# Patient Record
Sex: Female | Born: 1950 | Race: White | Hispanic: No | Marital: Married | Smoking: Former smoker
Health system: Southern US, Community
[De-identification: ages and names within clinical notes are randomized; demographics above are authoritative.]

## PROBLEM LIST (undated history)

## (undated) DIAGNOSIS — M1990 Unspecified osteoarthritis, unspecified site: Secondary | ICD-10-CM

## (undated) DIAGNOSIS — F329 Major depressive disorder, single episode, unspecified: Secondary | ICD-10-CM

## (undated) DIAGNOSIS — I10 Essential (primary) hypertension: Secondary | ICD-10-CM

## (undated) DIAGNOSIS — S0291XA Unspecified fracture of skull, initial encounter for closed fracture: Secondary | ICD-10-CM

## (undated) DIAGNOSIS — G709 Myoneural disorder, unspecified: Secondary | ICD-10-CM

## (undated) DIAGNOSIS — F0280 Dementia in other diseases classified elsewhere without behavioral disturbance: Secondary | ICD-10-CM

## (undated) DIAGNOSIS — E785 Hyperlipidemia, unspecified: Secondary | ICD-10-CM

## (undated) DIAGNOSIS — F419 Anxiety disorder, unspecified: Secondary | ICD-10-CM

## (undated) DIAGNOSIS — F32A Depression, unspecified: Secondary | ICD-10-CM

## (undated) DIAGNOSIS — K219 Gastro-esophageal reflux disease without esophagitis: Secondary | ICD-10-CM

## (undated) HISTORY — DX: Myoneural disorder, unspecified: G70.9

## (undated) HISTORY — DX: Unspecified osteoarthritis, unspecified site: M19.90

## (undated) HISTORY — DX: Hyperlipidemia, unspecified: E78.5

## (undated) HISTORY — PX: TUBAL LIGATION: SHX77

## (undated) HISTORY — DX: Gastro-esophageal reflux disease without esophagitis: K21.9

## (undated) HISTORY — DX: Depression, unspecified: F32.A

## (undated) HISTORY — DX: Essential (primary) hypertension: I10

## (undated) HISTORY — PX: ABDOMINAL HYSTERECTOMY: SHX81

## (undated) HISTORY — DX: Major depressive disorder, single episode, unspecified: F32.9

## (undated) HISTORY — DX: Anxiety disorder, unspecified: F41.9

---

## 1997-12-30 ENCOUNTER — Emergency Department (HOSPITAL_COMMUNITY): Admission: EM | Admit: 1997-12-30 | Discharge: 1997-12-30 | Payer: Self-pay

## 2002-01-31 ENCOUNTER — Encounter: Payer: Self-pay | Admitting: Emergency Medicine

## 2002-01-31 ENCOUNTER — Emergency Department (HOSPITAL_COMMUNITY): Admission: EM | Admit: 2002-01-31 | Discharge: 2002-02-01 | Payer: Self-pay | Admitting: Plastic Surgery

## 2005-05-18 ENCOUNTER — Inpatient Hospital Stay (HOSPITAL_COMMUNITY): Admission: RE | Admit: 2005-05-18 | Discharge: 2005-05-23 | Payer: Self-pay | Admitting: *Deleted

## 2005-05-19 ENCOUNTER — Ambulatory Visit: Payer: Self-pay | Admitting: *Deleted

## 2007-03-01 ENCOUNTER — Emergency Department (HOSPITAL_COMMUNITY): Admission: EM | Admit: 2007-03-01 | Discharge: 2007-03-01 | Payer: Self-pay | Admitting: Emergency Medicine

## 2008-07-27 ENCOUNTER — Ambulatory Visit: Payer: Self-pay | Admitting: Diagnostic Radiology

## 2008-07-27 ENCOUNTER — Emergency Department (HOSPITAL_BASED_OUTPATIENT_CLINIC_OR_DEPARTMENT_OTHER): Admission: EM | Admit: 2008-07-27 | Discharge: 2008-07-27 | Payer: Self-pay | Admitting: Emergency Medicine

## 2008-09-22 ENCOUNTER — Inpatient Hospital Stay (HOSPITAL_COMMUNITY): Admission: AD | Admit: 2008-09-22 | Discharge: 2008-09-23 | Payer: Self-pay | Admitting: Psychiatry

## 2008-09-22 ENCOUNTER — Ambulatory Visit: Payer: Self-pay | Admitting: Psychiatry

## 2010-04-09 LAB — CBC
HCT: 40.2 % (ref 36.0–46.0)
Hemoglobin: 13.8 g/dL (ref 12.0–15.0)
MCHC: 34.3 g/dL (ref 30.0–36.0)
MCV: 95.1 fL (ref 78.0–100.0)
Platelets: 271 10*3/uL (ref 150–400)
RBC: 4.22 MIL/uL (ref 3.87–5.11)
RDW: 13.4 % (ref 11.5–15.5)
WBC: 11.7 10*3/uL — ABNORMAL HIGH (ref 4.0–10.5)

## 2010-04-09 LAB — COMPREHENSIVE METABOLIC PANEL
ALT: 20 U/L (ref 0–35)
AST: 23 U/L (ref 0–37)
Albumin: 3.5 g/dL (ref 3.5–5.2)
Alkaline Phosphatase: 100 U/L (ref 39–117)
BUN: 7 mg/dL (ref 6–23)
CO2: 28 mEq/L (ref 19–32)
Calcium: 8.9 mg/dL (ref 8.4–10.5)
Chloride: 106 mEq/L (ref 96–112)
Creatinine, Ser: 0.76 mg/dL (ref 0.4–1.2)
GFR calc Af Amer: >60 mL/min (ref >60)
GFR calc non Af Amer: >60 mL/min (ref >60)
Glucose, Bld: 93 mg/dL (ref 70–99)
Potassium: 3.9 mEq/L (ref 3.5–5.1)
Sodium: 140 mEq/L (ref 135–145)
Total Bilirubin: 0.5 mg/dL (ref 0.3–1.2)
Total Protein: 6.5 g/dL (ref 6.0–8.3)
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
## 2010-09-24 LAB — COMPREHENSIVE METABOLIC PANEL
ALT: 25
AST: 27
Albumin: 3.8
Alkaline Phosphatase: 98
BUN: 8
CO2: 28
Calcium: 9.1
Chloride: 105
Creatinine, Ser: 0.74
GFR calc Af Amer: >60
GFR calc non Af Amer: >60
Glucose, Bld: 87
Potassium: 4.3
Sodium: 138
Total Bilirubin: 0.7
Total Protein: 6.4

## 2010-09-24 LAB — DIFFERENTIAL
Basophils Absolute: 0
Basophils Relative: 1
Eosinophils Absolute: 0.2
Eosinophils Relative: 2
Lymphocytes Relative: 40
Lymphs Abs: 3.2
Monocytes Absolute: 0.6
Monocytes Relative: 8
Neutro Abs: 4
Neutrophils Relative %: 50

## 2010-09-24 LAB — CBC
HCT: 41.2
Hemoglobin: 14.2
MCHC: 34.5
MCV: 91.1
Platelets: 246
RBC: 4.52
RDW: 13.3
WBC: 8.1

## 2013-02-12 ENCOUNTER — Other Ambulatory Visit: Payer: Self-pay | Admitting: Family Medicine

## 2013-02-12 DIAGNOSIS — R112 Nausea with vomiting, unspecified: Secondary | ICD-10-CM

## 2013-02-13 ENCOUNTER — Other Ambulatory Visit: Payer: Self-pay | Admitting: Family Medicine

## 2013-02-13 ENCOUNTER — Ambulatory Visit
Admission: RE | Admit: 2013-02-13 | Discharge: 2013-02-13 | Disposition: A | Discharge status: Home / Self Care | Payer: No Typology Code available for payment source | Source: Ambulatory Visit | Attending: Family Medicine | Admitting: Family Medicine

## 2013-02-13 DIAGNOSIS — R112 Nausea with vomiting, unspecified: Secondary | ICD-10-CM

## 2013-02-13 DIAGNOSIS — R109 Unspecified abdominal pain: Secondary | ICD-10-CM

## 2013-02-13 MED ORDER — IOHEXOL 300 MG/ML  SOLN
40.0000 mL | Freq: Once | INTRAMUSCULAR | Status: AC | PRN
  Administered 2013-02-13: 40 mL via ORAL

## 2013-02-13 MED ORDER — IOHEXOL 300 MG/ML  SOLN
100.0000 mL | Freq: Once | INTRAMUSCULAR | Status: AC | PRN
  Administered 2013-02-13: 100 mL via INTRAVENOUS

## 2014-12-29 ENCOUNTER — Ambulatory Visit (INDEPENDENT_AMBULATORY_CARE_PROVIDER_SITE_OTHER): Payer: No Typology Code available for payment source | Admitting: Family Medicine

## 2014-12-29 VITALS — BP 120/66 | HR 79 | Temp 98.8°F | Resp 18 | Ht 60.5 in | Wt 153.4 lb

## 2014-12-29 DIAGNOSIS — J0100 Acute maxillary sinusitis, unspecified: Secondary | ICD-10-CM

## 2014-12-29 DIAGNOSIS — Z7189 Other specified counseling: Secondary | ICD-10-CM

## 2014-12-29 DIAGNOSIS — H6123 Impacted cerumen, bilateral: Secondary | ICD-10-CM

## 2014-12-29 DIAGNOSIS — Z7185 Encounter for immunization safety counseling: Secondary | ICD-10-CM

## 2014-12-29 LAB — ACUTE HEP PANEL AND HEP B SURFACE AB
HCV Ab: NEGATIVE
Hep A IgM: NONREACTIVE
Hep B C IgM: NONREACTIVE
Hep B S Ab: NEGATIVE
Hepatitis B Surface Ag: NEGATIVE

## 2014-12-29 MED ORDER — AMOXICILLIN-POT CLAVULANATE 875-125 MG PO TABS: 1.0000 | ORAL_TABLET | Freq: Two times a day (BID) | ORAL | Status: DC

## 2014-12-29 NOTE — Patient Instructions (Signed)
Cerumen Impaction The structures of the external ear canal secrete a waxy substance known as cerumen. Excess cerumen can build up in the ear canal, causing a condition known as cerumen impaction. Cerumen impaction can cause ear pain and disrupt the function of the ear. The rate of cerumen production differs for each individual. In certain individuals, the configuration of the ear canal may decrease his or her ability to naturally remove cerumen. CAUSES Cerumen impaction is caused by excessive cerumen production or buildup. RISK FACTORS  Frequent use of swabs to clean ears.  Having narrow ear canals.  Having eczema.  Being dehydrated. SIGNS AND SYMPTOMS  Diminished hearing.  Ear drainage.  Ear pain.  Ear itch. TREATMENT Treatment may involve:  Over-the-counter or prescription ear drops to soften the cerumen.  Removal of cerumen by a health care provider. This may be done with:  Irrigation with warm water. This is the most common method of removal.  Ear curettes and other instruments.  Surgery. This may be done in severe cases. HOME CARE INSTRUCTIONS  Take medicines only as directed by your health care provider.  Do not insert objects into the ear with the intent of cleaning the ear. PREVENTION  Do not insert objects into the ear, even with the intent of cleaning the ear. Removing cerumen as a part of normal hygiene is not necessary, and the use of swabs in the ear canal is not recommended.  Drink enough water to keep your urine clear or pale yellow.  Control your eczema if you have it. SEEK MEDICAL CARE IF:  You develop ear pain.  You develop bleeding from the ear.  The cerumen does not clear after you use ear drops as directed.   This information is not intended to replace advice given to you by your health care provider. Make sure you discuss any questions you have with your health care provider.   Document Released: 01/28/2004 Document Revised: 01/10/2014  Document Reviewed: 08/06/2014 Elsevier Interactive Patient Education 2016 Elsevier Inc.  

## 2014-12-29 NOTE — Progress Notes (Signed)
@UMFCLOGO @  By signing my name below, I, [name], attest that this documentation has been prepared under the direction and in the presence of Robyn Haber, MD.  Electronically Signed: [name], ED Scribe. 12/29/2014. 4:45 PM.  Patient ID: Shelly Farrell MRN: [id number], DOB: 01/08/1950, 64 y.o. Date of Encounter: 12/29/2014, 4:30 PM  Primary Physician: No primary care provider on file.  Chief Complaint:  Chief Complaint  Patient presents with  . Need to get check for HEP C    Was exposed to Hep. C. Her daughter was diagnosed with Hep. C 63 weeks ago.  . Sinusitis    facial pain , congestion x yesterday    HPI: 64 y.o. year old female with history below presents with Hep C check. Pt states she's been exposed to Hep C. Her 63 y.o daughter was hospitalized for 1 week, 2 weeks ago, after presenting with dehydration and emesis, later diagnosed with acute hepatitis C.   Sinusitis Pt also reports sinusitis, developed within the past 24 hours. Symptoms consisted of muffled hearing in right ear, congestion and facial pain. No drug allergies.   Positive Depression screening  .  Depression screen PHQ 2/9 12/29/2014  Decreased Interest 2  Down, Depressed, Hopeless 2  PHQ - 2 Score 4  Altered sleeping 0  Tired, decreased energy 2  Change in appetite 1  Feeling bad or failure about yourself  3  Trouble concentrating 1  Moving slowly or fidgety/restless 1  Suicidal thoughts 1  PHQ-9 Score 13  Difficult doing work/chores Somewhat difficult   Pt states her daughter has been a "problem since she was 13". She reports her daughter having trouble with drug and in prison. Pt states she feels discouraged and depressed on and off. Her grand daughter is now living with her. Pt declines needing medication today. She reports she see's her regular physician at, [facility], who's given her everything that she needs.   Past Medical History  Diagnosis Date  . Hyperlipidemia   . Hypertension    . Anxiety   . Arthritis   . Depression   . GERD (gastroesophageal reflux disease)   . Neuromuscular disorder ([place])      Home Meds: Prior to Admission medications   Medication Sig Start Date End Date Taking? Authorizing Provider  amitriptyline (ELAVIL) 10 MG tablet Take 10 mg by mouth at bedtime.   Yes Historical Provider, MD  hydrochlorothiazide (MICROZIDE) 12.5 MG capsule Take 12.5 mg by mouth daily.   Yes Historical Provider, MD  lisinopril (PRINIVIL,ZESTRIL) 20 MG tablet Take 20 mg by mouth daily.   Yes Historical Provider, MD  omeprazole (PRILOSEC) 20 MG capsule Take 20 mg by mouth daily.   Yes Historical Provider, MD    Allergies: No Known Allergies  Social History   Social History  . Marital Status: Married    Spouse Name: N/A  . Number of Children: N/A  . Years of Education: N/A   Occupational History  . Not on file.   Social History Main Topics  . Smoking status: Former Research scientist (life sciences)  . Smokeless tobacco: Not on file  . Alcohol Use: No  . Drug Use: No  . Sexual Activity: Not on file   Other Topics Concern  . Not on file   Social History Narrative  . No narrative on file     Review of Systems: Constitutional: negative for chills, fever, night sweats, weight changes, or fatigue  HEENT: negative for vision changes,   rhinorrhea, ST, epistaxis, or sinus  pressure Cardiovascular: negative for chest pain or palpitations Respiratory: negative for hemoptysis, wheezing, shortness of breath, or cough Abdominal: negative for abdominal pain, nausea, vomiting, diarrhea, or constipation Dermatological: negative for rash Neurologic: negative for headache, dizziness, or syncope All other systems reviewed and are otherwise negative with the exception to those above and in the HPI.   Physical Exam: Blood pressure 120/66, pulse 79, temperature 98.8 F (37.1 C), temperature source Oral, resp. rate 18, height 5' 0.5" (1.537 m), weight 153 lb 6.4 oz (69.582 kg), SpO2 96 %., Body  mass index is 29.45 kg/(m^2). General: Well developed, well nourished, in no acute distress. Head: Normocephalic, atraumatic, eyes without discharge, sclera non-icteric, nares are without discharge. Bilateral auditory canals with cerumen, TM's are without perforation, pearly grey and translucent with reflective cone of light bilaterally. Oral cavity moist, posterior pharynx without exudate, erythema, peritonsillar abscess, or post nasal drip.  Neck: Supple. No thyromegaly. Full ROM. No lymphadenopathy. Lungs: Clear bilaterally to auscultation without wheezes, rales, or rhonchi. Breathing is unlabored. Heart: RRR with S1 S2. No murmurs, rubs, or gallops appreciated. Abdomen: Soft, non-tender, non-distended with normoactive bowel sounds. No hepatomegaly. No rebound/guarding. No obvious abdominal masses. Msk:  Strength and tone normal for age. Extremities/Skin: Warm and dry. No clubbing or cyanosis. No edema. No rashes or suspicious lesions. Neuro: Alert and oriented X 3. Moves all extremities spontaneously. Gait is normal. CNII-XII grossly in tact. Psych:  Responds to questions appropriately with a normal affect.     ASSESSMENT AND PLAN:  64 y.o. year old female with sinusitis, acute stress with daughter's ongoing dependence, and hearing deficit with cerumen impaction This chart was scribed in my presence and reviewed by me personally.    ICD-9-CM ICD-10-CM   1. Immunization counseling V65.49 Z71.89 Hepatitis C antibody     Acute Hep Panel & Hep B Surface Ab  2. Acute maxillary sinusitis, recurrence not specified 461.0 J01.00 amoxicillin-clavulanate (AUGMENTIN) 875-125 MG tablet  3. Cerumen impaction, bilateral 380.4 H61.23      Signed, Robyn Haber, MD    Signed, Robyn Haber, MD 12/29/2014 4:30 PM

## 2015-01-04 ENCOUNTER — Encounter: Payer: Self-pay | Admitting: *Deleted

## 2015-01-06 ENCOUNTER — Telehealth: Payer: Self-pay

## 2015-01-06 NOTE — Telephone Encounter (Signed)
Pt called about lab results. Let her know they were normal and a letter was sent 12/27

## 2017-03-02 ENCOUNTER — Observation Stay (HOSPITAL_COMMUNITY): Payer: Medicare Other

## 2017-03-02 ENCOUNTER — Emergency Department (HOSPITAL_COMMUNITY): Payer: Medicare Other

## 2017-03-02 ENCOUNTER — Inpatient Hospital Stay (HOSPITAL_COMMUNITY)
Admission: EM | Admit: 2017-03-02 | Discharge: 2017-03-08 | DRG: 083 | Disposition: A | Discharge status: Home Health | Payer: Medicare Other | Attending: Neurosurgery | Admitting: Neurosurgery

## 2017-03-02 ENCOUNTER — Other Ambulatory Visit: Payer: Self-pay

## 2017-03-02 ENCOUNTER — Encounter (HOSPITAL_COMMUNITY): Payer: Self-pay

## 2017-03-02 DIAGNOSIS — S06330A Contusion and laceration of cerebrum, unspecified, without loss of consciousness, initial encounter: Secondary | ICD-10-CM

## 2017-03-02 DIAGNOSIS — S0633AA Contusion and laceration of cerebrum, unspecified, with loss of consciousness status unknown, initial encounter: Secondary | ICD-10-CM

## 2017-03-02 DIAGNOSIS — S06339A Contusion and laceration of cerebrum, unspecified, with loss of consciousness of unspecified duration, initial encounter: Secondary | ICD-10-CM

## 2017-03-02 DIAGNOSIS — I609 Nontraumatic subarachnoid hemorrhage, unspecified: Secondary | ICD-10-CM | POA: Diagnosis not present

## 2017-03-02 DIAGNOSIS — K219 Gastro-esophageal reflux disease without esophagitis: Secondary | ICD-10-CM | POA: Diagnosis present

## 2017-03-02 DIAGNOSIS — S02119A Unspecified fracture of occiput, initial encounter for closed fracture: Secondary | ICD-10-CM | POA: Diagnosis present

## 2017-03-02 DIAGNOSIS — R0602 Shortness of breath: Secondary | ICD-10-CM

## 2017-03-02 DIAGNOSIS — E876 Hypokalemia: Secondary | ICD-10-CM | POA: Diagnosis not present

## 2017-03-02 DIAGNOSIS — E785 Hyperlipidemia, unspecified: Secondary | ICD-10-CM | POA: Diagnosis present

## 2017-03-02 DIAGNOSIS — F419 Anxiety disorder, unspecified: Secondary | ICD-10-CM | POA: Diagnosis present

## 2017-03-02 DIAGNOSIS — Z79899 Other long term (current) drug therapy: Secondary | ICD-10-CM

## 2017-03-02 DIAGNOSIS — S06379A Contusion, laceration, and hemorrhage of cerebellum with loss of consciousness of unspecified duration, initial encounter: Principal | ICD-10-CM | POA: Diagnosis present

## 2017-03-02 DIAGNOSIS — W109XXA Fall (on) (from) unspecified stairs and steps, initial encounter: Secondary | ICD-10-CM | POA: Diagnosis present

## 2017-03-02 DIAGNOSIS — S0291XA Unspecified fracture of skull, initial encounter for closed fracture: Secondary | ICD-10-CM | POA: Diagnosis present

## 2017-03-02 DIAGNOSIS — F329 Major depressive disorder, single episode, unspecified: Secondary | ICD-10-CM | POA: Diagnosis present

## 2017-03-02 DIAGNOSIS — Z87891 Personal history of nicotine dependence: Secondary | ICD-10-CM

## 2017-03-02 DIAGNOSIS — J811 Chronic pulmonary edema: Secondary | ICD-10-CM | POA: Diagnosis not present

## 2017-03-02 DIAGNOSIS — I10 Essential (primary) hypertension: Secondary | ICD-10-CM | POA: Diagnosis present

## 2017-03-02 DIAGNOSIS — R064 Hyperventilation: Secondary | ICD-10-CM

## 2017-03-02 DIAGNOSIS — S0211HA Other fracture of occiput, left side, initial encounter for closed fracture: Secondary | ICD-10-CM

## 2017-03-02 LAB — PROTIME-INR
INR: 0.92
Prothrombin Time: 12.2 seconds (ref 11.4–15.2)

## 2017-03-02 LAB — CBC WITH DIFFERENTIAL/PLATELET
Basophils Absolute: 0 10*3/uL (ref 0.0–0.1)
Basophils Relative: 0 %
Eosinophils Absolute: 0.1 10*3/uL (ref 0.0–0.7)
Eosinophils Relative: 1 %
HCT: 36.6 % (ref 36.0–46.0)
Hemoglobin: 11.9 g/dL — ABNORMAL LOW (ref 12.0–15.0)
Lymphocytes Relative: 10 %
Lymphs Abs: 1.7 10*3/uL (ref 0.7–4.0)
MCH: 30.3 pg (ref 26.0–34.0)
MCHC: 32.5 g/dL (ref 30.0–36.0)
MCV: 93.1 fL (ref 78.0–100.0)
Monocytes Absolute: 0.6 10*3/uL (ref 0.1–1.0)
Monocytes Relative: 4 %
Neutro Abs: 14.4 10*3/uL — ABNORMAL HIGH (ref 1.7–7.7)
Neutrophils Relative %: 85 %
Platelets: 257 10*3/uL (ref 150–400)
RBC: 3.93 MIL/uL (ref 3.87–5.11)
RDW: 13.9 % (ref 11.5–15.5)
WBC: 16.7 10*3/uL — ABNORMAL HIGH (ref 4.0–10.5)

## 2017-03-02 LAB — BASIC METABOLIC PANEL
Anion gap: 13 (ref 5–15)
BUN: 19 mg/dL (ref 6–20)
CO2: 23 mmol/L (ref 22–32)
Calcium: 9.1 mg/dL (ref 8.9–10.3)
Chloride: 102 mmol/L (ref 101–111)
Creatinine, Ser: 1.06 mg/dL — ABNORMAL HIGH (ref 0.44–1.00)
GFR calc Af Amer: >60 mL/min (ref >60)
GFR calc non Af Amer: 54 mL/min — ABNORMAL LOW (ref >60)
Glucose, Bld: 120 mg/dL — ABNORMAL HIGH (ref 65–99)
Potassium: 3.8 mmol/L (ref 3.5–5.1)
Sodium: 138 mmol/L (ref 135–145)

## 2017-03-02 LAB — MRSA PCR SCREENING: MRSA by PCR: NEGATIVE

## 2017-03-02 LAB — TYPE AND SCREEN
ABO/RH(D): A POS
Antibody Screen: NEGATIVE

## 2017-03-02 LAB — ABO/RH: ABO/RH(D): A POS

## 2017-03-02 MED ORDER — LISINOPRIL 20 MG PO TABS
20.0000 mg | ORAL_TABLET | Freq: Every day | ORAL | Status: DC
  Administered 2017-03-03 – 2017-03-08 (×6): 20 mg via ORAL
  Filled 2017-03-02 (×8): qty 1

## 2017-03-02 MED ORDER — HYDROCHLOROTHIAZIDE 12.5 MG PO CAPS: 12.5000 mg | ORAL_CAPSULE | Freq: Every day | ORAL | Status: DC

## 2017-03-02 MED ORDER — AMITRIPTYLINE HCL 25 MG PO TABS
50.0000 mg | ORAL_TABLET | Freq: Every day | ORAL | Status: DC
  Administered 2017-03-03 – 2017-03-07 (×5): 50 mg via ORAL
  Filled 2017-03-02 (×6): qty 2

## 2017-03-02 MED ORDER — HYDROCODONE-ACETAMINOPHEN 5-325 MG PO TABS
1.0000 | ORAL_TABLET | ORAL | Status: DC | PRN
  Administered 2017-03-03 – 2017-03-07 (×6): 1 via ORAL
  Filled 2017-03-02 (×6): qty 1

## 2017-03-02 MED ORDER — ONDANSETRON HCL 4 MG/2ML IJ SOLN
4.0000 mg | Freq: Four times a day (QID) | INTRAMUSCULAR | Status: DC | PRN
  Administered 2017-03-02 – 2017-03-03 (×5): 4 mg via INTRAVENOUS
  Filled 2017-03-02 (×6): qty 2

## 2017-03-02 MED ORDER — SIMVASTATIN 20 MG PO TABS
20.0000 mg | ORAL_TABLET | Freq: Every day | ORAL | Status: DC
  Filled 2017-03-02 (×2): qty 1

## 2017-03-02 MED ORDER — FENTANYL CITRATE (PF) 100 MCG/2ML IJ SOLN
50.0000 ug | Freq: Once | INTRAMUSCULAR | Status: AC
Start: 2017-03-02 — End: 2017-03-02
  Administered 2017-03-02: 50 ug via INTRAVENOUS
  Filled 2017-03-02: qty 2

## 2017-03-02 MED ORDER — PANTOPRAZOLE SODIUM 40 MG PO TBEC: 40.0000 mg | DELAYED_RELEASE_TABLET | Freq: Every day | ORAL | Status: DC

## 2017-03-02 MED ORDER — SIMVASTATIN 20 MG PO TABS
20.0000 mg | ORAL_TABLET | Freq: Every day | ORAL | Status: DC
  Administered 2017-03-03 – 2017-03-07 (×5): 20 mg via ORAL
  Filled 2017-03-02 (×6): qty 1

## 2017-03-02 MED ORDER — HYDROCHLOROTHIAZIDE 12.5 MG PO CAPS
12.5000 mg | ORAL_CAPSULE | Freq: Every day | ORAL | Status: DC
  Filled 2017-03-02: qty 1

## 2017-03-02 MED ORDER — ONDANSETRON HCL 4 MG/2ML IJ SOLN
4.0000 mg | Freq: Once | INTRAMUSCULAR | Status: AC | PRN
  Administered 2017-03-02: 4 mg via INTRAVENOUS
  Filled 2017-03-02: qty 2

## 2017-03-02 MED ORDER — ACETAMINOPHEN 325 MG PO TABS
650.0000 mg | ORAL_TABLET | ORAL | Status: DC | PRN
  Administered 2017-03-04 – 2017-03-08 (×7): 650 mg via ORAL
  Filled 2017-03-02 (×7): qty 2

## 2017-03-02 MED ORDER — MORPHINE SULFATE (PF) 4 MG/ML IV SOLN
2.0000 mg | INTRAVENOUS | Status: DC | PRN
  Administered 2017-03-02 – 2017-03-03 (×9): 2 mg via INTRAVENOUS
  Filled 2017-03-02 (×10): qty 1

## 2017-03-02 MED ORDER — PANTOPRAZOLE SODIUM 40 MG PO TBEC
40.0000 mg | DELAYED_RELEASE_TABLET | Freq: Every day | ORAL | Status: DC
  Administered 2017-03-03 – 2017-03-07 (×5): 40 mg via ORAL
  Filled 2017-03-02 (×5): qty 1

## 2017-03-02 MED ORDER — PROCHLORPERAZINE 25 MG RE SUPP
25.0000 mg | Freq: Once | RECTAL | Status: AC
  Administered 2017-03-02: 25 mg via RECTAL
  Filled 2017-03-02: qty 1

## 2017-03-02 MED ORDER — SODIUM CHLORIDE 0.9 % IV SOLN
INTRAVENOUS | Status: DC
  Administered 2017-03-02: 1000 mL via INTRAVENOUS
  Administered 2017-03-04: 16:00:00 via INTRAVENOUS

## 2017-03-02 MED ORDER — SODIUM CHLORIDE 0.9 % IV SOLN: INTRAVENOUS | Status: DC

## 2017-03-02 MED ORDER — ONDANSETRON HCL 4 MG PO TABS: 4.0000 mg | ORAL_TABLET | Freq: Four times a day (QID) | ORAL | Status: DC | PRN

## 2017-03-02 MED ORDER — ONDANSETRON HCL 4 MG/2ML IJ SOLN
4.0000 mg | Freq: Once | INTRAMUSCULAR | Status: AC
  Administered 2017-03-02: 4 mg via INTRAVENOUS
  Filled 2017-03-02: qty 2

## 2017-03-02 MED ORDER — HYDROCHLOROTHIAZIDE 12.5 MG PO CAPS
12.5000 mg | ORAL_CAPSULE | Freq: Every day | ORAL | Status: DC
  Administered 2017-03-03 – 2017-03-08 (×6): 12.5 mg via ORAL
  Filled 2017-03-02 (×7): qty 1

## 2017-03-02 NOTE — ED Notes (Signed)
Pt denies nausea and again refuses compazine suppositiry.

## 2017-03-02 NOTE — ED Notes (Signed)
Pt sleeping at this time.

## 2017-03-02 NOTE — ED Notes (Signed)
Pt to ct with RN x 2 and monitor.

## 2017-03-02 NOTE — ED Triage Notes (Signed)
Patient awoke in the middle of the night to let her dog outside but instead of opening the door to outside, she opened the basement door and fell down a short flight of steps (Per EMS (5) steep steps). Denies LOC, vomited on arrival to room

## 2017-03-02 NOTE — ED Notes (Addendum)
Pt vomits small amount of emesis

## 2017-03-02 NOTE — Discharge Instructions (Addendum)
.  kc °

## 2017-03-02 NOTE — ED Notes (Signed)
Pt moved to hospital bed with assist x3 and  linen removed from behind. Pt begins projectile vomiting large amount. Dr. Cyndy Freeze informed of events. States that vomiting part of injury.Informed pt unable to take po meds at this time.

## 2017-03-02 NOTE — ED Notes (Signed)
Pt vomits small amount then returns to sleep.

## 2017-03-02 NOTE — Progress Notes (Signed)
PT Cancellation Note  Patient Details Name: Amonda Brillhart MRN: [id number] DOB: 08/26/1950   Cancelled Treatment:    Reason Eval/Treat Not Completed: Medical issues which prohibited therapy Per RN pt with increased pain and not appropriate to work with PT. Will follow up as pt medically appropriate and as schedule allows.   Leighton Ruff, PT, DPT  Acute Rehabilitation Services  Pager: [phone]   Rudean Hitt 03/02/2017, 11:08 AM

## 2017-03-02 NOTE — ED Notes (Signed)
Pt states her nausea improved at thsi time and will wait on the suppository.

## 2017-03-02 NOTE — ED Notes (Signed)
Attempted to call report

## 2017-03-02 NOTE — ED Notes (Signed)
Patient vomited again. A&O x4, pupils PERRL

## 2017-03-02 NOTE — ED Notes (Addendum)
Pt reports small amount of vomitting. Pt remains A& O. C/o headache. Dr. Cyndy Freeze notified with order for CT resulting.

## 2017-03-02 NOTE — H&P (Signed)
Cc: Basilar skull fracture, left occiput HPI: Shelly Farrell is a 67 y.o. female Whom while taking the dog out this morning at her home fell down a steep staircase striking the back of her head.  Her daughter was alerted by hearing her mother ask for help. It is unknown if she lost consciousness. When found by her daughter she was alert and following commands. She complained of her head hurting. Shelly Farrell also was nauseated. She arrived by ambulance to [facility] ED approximately 0230 03/02/17. Shelly Farrell has an abrasion lateral aspect left knee. Head CT was performed revealing a basilar skull fracture of the left occiput, and a tiny cerebellar contusion along the fracture line.  No Known Allergies Past Medical History:  Diagnosis Date  . Anxiety   . Arthritis   . Depression   . GERD (gastroesophageal reflux disease)   . Hyperlipidemia   . Hypertension   . Neuromuscular disorder ([place])    Family History  Problem Relation Age of Onset  . Cancer Father   . Hepatitis C Daughter    Social History   Socioeconomic History  . Marital status: Married    Spouse name: Not on file  . Number of children: Not on file  . Years of education: Not on file  . Highest education level: Not on file  Social Needs  . Financial resource strain: Not on file  . Food insecurity - worry: Not on file  . Food insecurity - inability: Not on file  . Transportation needs - medical: Not on file  . Transportation needs - non-medical: Not on file  Occupational History  . Not on file  Tobacco Use  . Smoking status: Former Smoker  Substance and Sexual Activity  . Alcohol use: No    Alcohol/week: 0.0 oz  . Drug use: No  . Sexual activity: Not on file  Other Topics Concern  . Not on file  Social History Narrative  . Not on file   Prior to Admission medications   Medication Sig Start Date End Date Taking? Authorizing Provider  amitriptyline (ELAVIL) 50 MG tablet Take 50 mg by mouth at bedtime.    [provider]  amoxicillin-clavulanate (AUGMENTIN) 875-125 MG tablet Take 1 tablet by mouth 2 (two) times daily. 12/29/14   Robyn Haber, MD  hydrochlorothiazide (MICROZIDE) 12.5 MG capsule Take 12.5 mg by mouth daily.    [provider]  lisinopril (PRINIVIL,ZESTRIL) 20 MG tablet Take 20 mg by mouth daily.    [provider]  omeprazole (PRILOSEC) 20 MG capsule Take 20 mg by mouth daily.    [provider]  simvastatin (ZOCOR) 20 MG tablet Take 20 mg by mouth daily.    [provider]   Results for orders placed or performed during the hospital encounter of 03/02/17 (from the past 24 hour(s))  Basic metabolic panel     Status: Abnormal   Collection Time: 03/02/17  5:43 AM  Result Value Ref Range   Sodium 138 135 - 145 mmol/L   Potassium 3.8 3.5 - 5.1 mmol/L   Chloride 102 101 - 111 mmol/L   CO2 23 22 - 32 mmol/L   Glucose, Bld 120 (H) 65 - 99 mg/dL   BUN 19 6 - 20 mg/dL   Creatinine, Ser 1.06 (H) 0.44 - 1.00 mg/dL   Calcium 9.1 8.9 - 10.3 mg/dL   GFR calc non Af Amer 54 (L) >60 mL/min   GFR calc Af Amer >60 >60 mL/min   Anion  gap 13 5 - 15  CBC with Differential/Platelet     Status: Abnormal   Collection Time: 03/02/17  5:43 AM  Result Value Ref Range   WBC 16.7 (H) 4.0 - 10.5 K/uL   RBC 3.93 3.87 - 5.11 MIL/uL   Hemoglobin 11.9 (L) 12.0 - 15.0 g/dL   HCT 36.6 36.0 - 46.0 %   MCV 93.1 78.0 - 100.0 fL   MCH 30.3 26.0 - 34.0 pg   MCHC 32.5 30.0 - 36.0 g/dL   RDW 13.9 11.5 - 15.5 %   Platelets 257 150 - 400 K/uL   Neutrophils Relative % 85 %   Neutro Abs 14.4 (H) 1.7 - 7.7 K/uL   Lymphocytes Relative 10 %   Lymphs Abs 1.7 0.7 - 4.0 K/uL   Monocytes Relative 4 %   Monocytes Absolute 0.6 0.1 - 1.0 K/uL   Eosinophils Relative 1 %   Eosinophils Absolute 0.1 0.0 - 0.7 K/uL   Basophils Relative 0 %   Basophils Absolute 0.0 0.0 - 0.1 K/uL  Protime-INR     Status: None   Collection Time: 03/02/17  5:43 AM  Result Value Ref Range    Prothrombin Time 12.2 11.4 - 15.2 seconds   INR 0.92   Type and screen     Status: None (Preliminary result)   Collection Time: 03/02/17  5:43 AM  Result Value Ref Range   ABO/RH(D) A POS    Antibody Screen PENDING    Sample Expiration      03/05/2017 Performed at [facility], [address] [place], [place] [address]    Physical Exam  Constitutional: She is oriented to person, place, and time. She appears well-developed and well-nourished. She appears distressed.  HENT:  Right Ear: External ear normal.  Left Ear: External ear normal.  Nose: Nose normal.  Mouth/Throat: Oropharynx is clear and moist.  Eyes: Conjunctivae and EOM are normal. Pupils are equal, round, and reactive to light.  Neck: Normal range of motion. Neck supple.  Cardiovascular: Normal rate, regular rhythm, normal heart sounds and intact distal pulses.  Pulmonary/Chest: Effort normal and breath sounds normal.  Abdominal: Soft. Bowel sounds are normal.  Musculoskeletal: Normal range of motion.  Neurological: She is alert and oriented to person, place, and time. She has normal strength and normal reflexes. She displays normal reflexes. No cranial nerve deficit or sensory deficit. She exhibits normal muscle tone. Coordination normal. GCS eye subscore is 4. GCS verbal subscore is 5. GCS motor subscore is 6. She displays no Babinski's sign on the right side. She displays no Babinski's sign on the left side.  Speech is clear and fluent No nystagmus, normal finger nose finger testing. Decreased hearing  Skin: Skin is warm and dry.  Psychiatric: She has a normal mood and affect. Her behavior is normal. Judgment and thought content normal.  Assessment/Plan Shelly Farrell fell this am sustaining a skull fracture, and cerebral contusion. While she has been nauseated, she has not had any emesis for aproximately 3 hours. I will admit to [facility] for observation. I will start IV fluids, and start on a diet. Close neuro  checks

## 2017-03-02 NOTE — ED Notes (Signed)
Pt placed on pure wick. Repositioned for comfort.

## 2017-03-02 NOTE — ED Provider Notes (Signed)
[facility] EMERGENCY DEPARTMENT Provider Note   CSN: [id number] Arrival date & time: 03/02/17  4098     History   Chief Complaint Chief Complaint  Patient presents with  . Fall    HPI Shelly Farrell is a 67 y.o. female.  The history is provided by the patient.  Fall  This is a new problem. Episode onset: Just prior to arrival. The problem occurs constantly. The problem has been gradually worsening. Associated symptoms include headaches. Pertinent negatives include no chest pain and no abdominal pain. Nothing aggravates the symptoms. Nothing relieves the symptoms.   Patient presents after fall.  She woke up in middle the night to let her dog out, she opened up the wrong door and fell down the steps in her basement.  She did not fall head over heels.  She did fall backwards and hit her posterior scalp.  No LOC, but she reports headache and vomiting.  She denies use of anticoagulants.  She reports neck pain. Past Medical History:  Diagnosis Date  . Anxiety   . Arthritis   . Depression   . GERD (gastroesophageal reflux disease)   . Hyperlipidemia   . Hypertension   . Neuromuscular disorder ([place])     There are no active problems to display for this patient.   OB History    No data available       Home Medications    Prior to Admission medications   Medication Sig Start Date End Date Taking? Authorizing Provider  amitriptyline (ELAVIL) 50 MG tablet Take 50 mg by mouth at bedtime.    [provider]  amoxicillin-clavulanate (AUGMENTIN) 875-125 MG tablet Take 1 tablet by mouth 2 (two) times daily. 12/29/14   Robyn Haber, MD  hydrochlorothiazide (MICROZIDE) 12.5 MG capsule Take 12.5 mg by mouth daily.    [provider]  lisinopril (PRINIVIL,ZESTRIL) 20 MG tablet Take 20 mg by mouth daily.    [provider]  omeprazole (PRILOSEC) 20 MG capsule Take 20 mg by mouth daily.    [provider]  simvastatin (ZOCOR) 20  MG tablet Take 20 mg by mouth daily.    [provider]    Family History Family History  Problem Relation Age of Onset  . Cancer Father   . Hepatitis C Daughter     Social History Social History   Tobacco Use  . Smoking status: Former Smoker  Substance Use Topics  . Alcohol use: No    Alcohol/week: 0.0 oz  . Drug use: No     Allergies   Patient has no known allergies.   Review of Systems Review of Systems  Constitutional: Negative for fever.  Cardiovascular: Negative for chest pain.  Gastrointestinal: Negative for abdominal pain.  Musculoskeletal: Positive for neck pain. Negative for back pain.  Neurological: Positive for headaches.  All other systems reviewed and are negative.    Physical Exam Updated Vital Signs BP (!) 143/61   Pulse 77   Temp 98.7 F (37.1 C) (Oral)   Ht 1.549 m (5\' 1" )   Wt 65.8 kg (145 lb)   SpO2 96%   BMI 27.40 kg/m   Physical Exam CONSTITUTIONAL: She is ill-appearing HEAD: Tenderness and hematoma noted to the occiput, no step-offs or crepitus, no lacerations EYES: EOMI/PERRL ENMT: Mucous membranes moist, no facial trauma NECK: supple no meningeal signs SPINE/BACK: Diffuse cervical spine tenderness, no bruising/crepitance/stepoffs noted to spine CV: S1/S2 noted, no murmurs/rubs/gallops noted LUNGS: Lungs are clear to auscultation bilaterally,  no apparent distress Chest-tenderness along left lower costal margin.,  No crepitus, no bruising ABDOMEN: soft, nontender, no rebound or guarding, bowel sounds noted throughout abdomen No LUQ/RUQ tenderness, no abdominal wall bruising GU:no cva tenderness NEURO: GCS 14-eyes closed.  She follows commands appropriately.  No focal motor deficits are noted extremities EXTREMITIES: pulses normal/equal, full ROM, abrasion beside left knee, no lacerations noted.  Pelvis is stable.  All other extremities/joints palpated/ranged and nontender SKIN: warm, color normal PSYCH: no abnormalities  of mood noted, alert and oriented to situation   ED Treatments / Results  Labs (all labs ordered are listed, but only abnormal results are displayed) Labs Reviewed  CBC WITH DIFFERENTIAL/PLATELET - Abnormal; Notable for the following components:      Result Value   WBC 16.7 (*)    Hemoglobin 11.9 (*)    Neutro Abs 14.4 (*)    All other components within normal limits  PROTIME-INR  BASIC METABOLIC PANEL  TYPE AND SCREEN    EKG  EKG Interpretation None       Radiology Dg Ribs Unilateral W/chest Left  Result Date: 03/02/2017 CLINICAL DATA:  67 year old female with fall and left-sided rib pain. EXAM: LEFT RIBS AND CHEST - 3+ VIEW COMPARISON:  Chest radiograph dated 05/08/2013 FINDINGS: There is emphysematous changes of the lungs with interstitial coarsening and bronchitic changes. Left mid to lower lung field atelectatic changes noted. There is no focal consolidation, pleural effusion, or pneumothorax. The cardiac silhouette is within normal limits. No acute osseous pathology.  No rib fracture. IMPRESSION: Negative. Electronically Signed   By: Anner Crete M.D.   On: 03/02/2017 05:30   Ct Head Wo Contrast  Result Date: 03/02/2017 CLINICAL DATA:  67 y/o  F; fall down stairs. EXAM: CT HEAD WITHOUT CONTRAST CT CERVICAL SPINE WITHOUT CONTRAST TECHNIQUE: Multidetector CT imaging of the head and cervical spine was performed following the standard protocol without intravenous contrast. Multiplanar CT image reconstructions of the cervical spine were also generated. COMPARISON:  03/29/2007 MRI of the head. 07/27/2008 cervical spine radiographs. FINDINGS: CT HEAD FINDINGS Brain: Small hemorrhagic cortical contusion within the left posterior cerebellar hemisphere. Small volume subarachnoid hemorrhage along the paramedian frontal lobes. No additional area of cortical contusion or intracranial hemorrhage identified. Background of mild chronic microvascular ischemic changes and parenchymal volume  loss of the brain. No mass effect, hydrocephalus, or herniation. Vascular: Mild calcific atherosclerosis of carotid siphons. No hyperdense vessel identified. Skull: Nondisplaced acute fracture of the left occipital bone. Sinuses/Orbits: No acute finding. Other: None. CT CERVICAL SPINE FINDINGS Alignment: C4-5 grade 1 anterolisthesis. Straightening of cervical lordosis. Skull base and vertebrae: No acute fracture. No primary bone lesion or focal pathologic process. Soft tissues and spinal canal: No prevertebral fluid or swelling. No visible canal hematoma. Disc levels: Multilevel disc and facet degenerative changes greatest at the C4-5 level. 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 Upper chest: Negative. Other: Negative. IMPRESSION: 1. Nondisplaced left occipital bone acute fracture. 2. Small hemorrhagic cortical contusion in left posterior cerebellar hemisphere. 3. Small volume subarachnoid hemorrhage along paramedian frontal lobes. 4. No acute fracture or dislocation of cervical spine identified. 5. Progression of cervical spine degenerative changes from 2010. 6. Mild chronic microvascular ischemic changes and parenchymal volume loss of the brain. These results were called by telephone at the time of interpretation on 03/02/2017 at 5:23 am to Dr. Ripley Fraise , who verbally acknowledged these results. Electronically Signed   By: Edgardo Roys.D.  On: 03/02/2017 05:25   Ct Cervical Spine Wo Contrast  Result Date: 03/02/2017 CLINICAL DATA:  67 y/o  F; fall down stairs. EXAM: CT HEAD WITHOUT CONTRAST CT CERVICAL SPINE WITHOUT CONTRAST TECHNIQUE: Multidetector CT imaging of the head and cervical spine was performed following the standard protocol without intravenous contrast. Multiplanar CT image reconstructions of the cervical spine were also generated. COMPARISON:  03/29/2007 MRI of the head. 07/27/2008 cervical spine radiographs. FINDINGS: CT  HEAD FINDINGS Brain: Small hemorrhagic cortical contusion within the left posterior cerebellar hemisphere. Small volume subarachnoid hemorrhage along the paramedian frontal lobes. No additional area of cortical contusion or intracranial hemorrhage identified. Background of mild chronic microvascular ischemic changes and parenchymal volume loss of the brain. No mass effect, hydrocephalus, or herniation. Vascular: Mild calcific atherosclerosis of carotid siphons. No hyperdense vessel identified. Skull: Nondisplaced acute fracture of the left occipital bone. Sinuses/Orbits: No acute finding. Other: None. CT CERVICAL SPINE FINDINGS Alignment: C4-5 grade 1 anterolisthesis. Straightening of cervical lordosis. Skull base and vertebrae: No acute fracture. No primary bone lesion or focal pathologic process. Soft tissues and spinal canal: No prevertebral fluid or swelling. No visible canal hematoma. Disc levels: Multilevel disc and facet degenerative changes greatest at the C4-5 level. 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 Upper chest: Negative. Other: Negative. IMPRESSION: 1. Nondisplaced left occipital bone acute fracture. 2. Small hemorrhagic cortical contusion in left posterior cerebellar hemisphere. 3. Small volume subarachnoid hemorrhage along paramedian frontal lobes. 4. No acute fracture or dislocation of cervical spine identified. 5. Progression of cervical spine degenerative changes from 2010. 6. Mild chronic microvascular ischemic changes and parenchymal volume loss of the brain. These results were called by telephone at the time of interpretation on 03/02/2017 at 5:23 am to Dr. Ripley Fraise , who verbally acknowledged these results. Electronically Signed   By: Kristine Garbe M.D.   On: 03/02/2017 05:25    Procedures Procedures  CRITICAL CARE Performed by: Sharyon Cable Total critical care time: 33 minutes Critical care time was  exclusive of separately billable procedures and treating other patients. Critical care was necessary to treat or prevent imminent or life-threatening deterioration. Critical care was time spent personally by me on the following activities: development of treatment plan with patient and/or surrogate as well as nursing, discussions with consultants, evaluation of patient's response to treatment, examination of patient, obtaining history from patient or surrogate, ordering and performing treatments and interventions, ordering and review of laboratory studies, ordering and review of radiographic studies, pulse oximetry and re-evaluation of patient's condition. Patient with skull fracture and traumatic subarachnoid hemorrhage after fall.  She would need to be admitted.  Discussed with neurosurgery consultation for admission  Medications Ordered in ED Medications  ondansetron (ZOFRAN) injection 4 mg (4 mg Intravenous Given 03/02/17 0356)  fentaNYL (SUBLIMAZE) injection 50 mcg (50 mcg Intravenous Given 03/02/17 0430)  ondansetron (ZOFRAN) injection 4 mg (4 mg Intravenous Given 03/02/17 0430)     Initial Impression / Assessment and Plan / ED Course  I have reviewed the triage vital signs and the nursing notes.  Pertinent imaging results that were available during my care of the patient were reviewed by me and considered in my medical decision making (see chart for details).     4:59 AM Patient here after mechanical fall striking her head.  She had vomiting, has a GCS of 14.  Will obtain CT head.  She also has neck pain as well as left chest wall pain.  Imaging ordered 5:53 AM CT imaging findings discussed with Dr. Christella Noa with neurosurgery.  Also discussed findings with patient and family.  Patient remains GCS of 14 no other acute issues. No focal abdominal tenderness.  No cervical spine fracture.  Chest x-ray is negative. 6:38 AM Seen by dr Arneta Cliche for admission Pt awake/alert at this time  Final  Clinical Impressions(s) / ED Diagnoses   Final diagnoses:  Subarachnoid bleed ([place])  Contusion of cerebrum without loss of consciousness, unspecified laterality, initial encounter [facility])  Other closed fracture of left side of occipital bone, initial encounter [facility])    ED Discharge Orders    None       Ripley Fraise, MD 03/02/17 [phone]

## 2017-03-02 NOTE — Progress Notes (Signed)
Patient ID: Shelly Farrell, female   DOB: May 31, 1950, 67 y.o.   MRN: [id number] BP (!) 143/70 (BP Location: Right Arm)   Pulse 81   Temp 98.7 F (37.1 C) (Oral)   Resp 18   Ht 5\' 1"  (1.549 m)   Wt 66.9 kg (147 lb 7.8 oz)   SpO2 98%   BMI 27.87 kg/m  Alert and oriented x 4 Speech is clear, and fluent Moving all extremities well Remains quite nauseated, emesis today. Repeat ct is stable, just believe the cause is due to the injury, and will improve with time.

## 2017-03-02 NOTE — ED Notes (Signed)
Pt again states she cannot take po meds.

## 2017-03-02 NOTE — ED Notes (Signed)
Dr. Cyndy Freeze contacted with continued vomiting.

## 2017-03-02 NOTE — ED Notes (Signed)
Hematoma to occipital lobe.

## 2017-03-03 ENCOUNTER — Encounter (HOSPITAL_COMMUNITY): Payer: Self-pay

## 2017-03-03 DIAGNOSIS — R269 Unspecified abnormalities of gait and mobility: Secondary | ICD-10-CM | POA: Diagnosis not present

## 2017-03-03 DIAGNOSIS — I10 Essential (primary) hypertension: Secondary | ICD-10-CM | POA: Diagnosis present

## 2017-03-03 DIAGNOSIS — W109XXA Fall (on) (from) unspecified stairs and steps, initial encounter: Secondary | ICD-10-CM | POA: Diagnosis present

## 2017-03-03 DIAGNOSIS — S0291XA Unspecified fracture of skull, initial encounter for closed fracture: Secondary | ICD-10-CM | POA: Diagnosis not present

## 2017-03-03 DIAGNOSIS — F419 Anxiety disorder, unspecified: Secondary | ICD-10-CM | POA: Diagnosis present

## 2017-03-03 DIAGNOSIS — S06379A Contusion, laceration, and hemorrhage of cerebellum with loss of consciousness of unspecified duration, initial encounter: Secondary | ICD-10-CM | POA: Diagnosis present

## 2017-03-03 DIAGNOSIS — E785 Hyperlipidemia, unspecified: Secondary | ICD-10-CM | POA: Diagnosis present

## 2017-03-03 DIAGNOSIS — Z79899 Other long term (current) drug therapy: Secondary | ICD-10-CM | POA: Diagnosis not present

## 2017-03-03 DIAGNOSIS — Z87891 Personal history of nicotine dependence: Secondary | ICD-10-CM | POA: Diagnosis not present

## 2017-03-03 DIAGNOSIS — I609 Nontraumatic subarachnoid hemorrhage, unspecified: Secondary | ICD-10-CM | POA: Diagnosis present

## 2017-03-03 DIAGNOSIS — J811 Chronic pulmonary edema: Secondary | ICD-10-CM | POA: Diagnosis not present

## 2017-03-03 DIAGNOSIS — F329 Major depressive disorder, single episode, unspecified: Secondary | ICD-10-CM | POA: Diagnosis present

## 2017-03-03 DIAGNOSIS — S06339A Contusion and laceration of cerebrum, unspecified, with loss of consciousness of unspecified duration, initial encounter: Secondary | ICD-10-CM | POA: Diagnosis not present

## 2017-03-03 DIAGNOSIS — I69398 Other sequelae of cerebral infarction: Secondary | ICD-10-CM | POA: Diagnosis not present

## 2017-03-03 DIAGNOSIS — K219 Gastro-esophageal reflux disease without esophagitis: Secondary | ICD-10-CM | POA: Diagnosis present

## 2017-03-03 DIAGNOSIS — S02119A Unspecified fracture of occiput, initial encounter for closed fracture: Secondary | ICD-10-CM | POA: Diagnosis present

## 2017-03-03 DIAGNOSIS — E876 Hypokalemia: Secondary | ICD-10-CM | POA: Diagnosis not present

## 2017-03-03 MED ORDER — VITAMIN D 1000 UNITS PO TABS
1000.0000 [IU] | ORAL_TABLET | Freq: Every day | ORAL | Status: DC
  Administered 2017-03-04 – 2017-03-08 (×5): 1000 [IU] via ORAL
  Filled 2017-03-03 (×5): qty 1

## 2017-03-03 MED ORDER — KETOROLAC TROMETHAMINE 30 MG/ML IJ SOLN
30.0000 mg | Freq: Once | INTRAMUSCULAR | Status: AC
  Administered 2017-03-03: 30 mg via INTRAVENOUS
  Filled 2017-03-03: qty 1

## 2017-03-03 MED ORDER — HYDROMORPHONE HCL 1 MG/ML IJ SOLN
0.5000 mg | INTRAMUSCULAR | Status: DC | PRN
  Administered 2017-03-03 – 2017-03-05 (×10): 0.5 mg via INTRAVENOUS
  Filled 2017-03-03 (×11): qty 0.5

## 2017-03-03 MED ORDER — ADULT MULTIVITAMIN W/MINERALS CH
1.0000 | ORAL_TABLET | Freq: Every day | ORAL | Status: DC
  Administered 2017-03-04 – 2017-03-08 (×5): 1 via ORAL
  Filled 2017-03-03 (×5): qty 1

## 2017-03-03 NOTE — Progress Notes (Signed)
1900 Bedside shift report. Pt resting, eyes closed, c/o nausea today and severe headache. Dgt and spouse at bedside. Fall precaution in place, [facility].  1955 Pt assessed, see flow sheet. Neuro intact. Pt's dgt informed RN that pt had not urinated since this am. Stated her mom is holding it. RN assisted pt up to [facility]. Pt's urine dark amber. Pt returned to bed. Updated pt and family with POC, fall precautions in place, WCTM.   2020 Dr. Christella Noa at bedside. Updated pt and spouse with CT results and POC. Pt medicated for pain and nausea per MAR. WCTM.   2200 Pt sleeping, easy to arouse. Neuro assessment performed, no changes noted. Son at bedside and report to RN that pt keeps bending arm and IV beeping. Because of mother's headache requested IV be changed. RN placed new IV and IVF resumed.

## 2017-03-03 NOTE — Progress Notes (Signed)
PT Cancellation Note  Patient Details Name: Shelly Farrell MRN: [id number] DOB: [birth year]   Cancelled Treatment:    Reason Eval/Treat Not Completed: Patient declined, no reason specified;Patient not medically ready.  jpt reported getting nauseous every time she moves.  Pt not wanting to move unless its necessary like toileting.  "I'm not ready."  Will see tomorrow as able. 03/03/2017  Donnella Sham, [place] [phone]  (pager)   [name] [name] 03/03/2017, 4:25 PM

## 2017-03-03 NOTE — Progress Notes (Addendum)
0000 Pt sleeping comfortably, easy to arouse, VSS, pt assisted up to [facility], no new neuro changes noted. WCTM.   [place] RN called to room for pain and nausea meds. RN medicated per MAR. No change in assessment noted. WCTM.   0410 Pt sleeping, easy to arouse, pt very HOH in right ear. No new neuro changes noted. WCTM.   0600 RN called to room, assisted pt up to [facility], pt with severe dry heaving/nausea. Pt also c/o pain, medicated per MAR. Pt returned to bed, cold wash cloth to head and ice pack to back of neck. No neuro changes noted with assessment. Pt resting comfortably. Fall precautions in place. Awaiting to give shift report to day RN.

## 2017-03-04 NOTE — Progress Notes (Signed)
Vitals:   03/04/17 0000 03/04/17 0341 03/04/17 0400 03/04/17 0800  BP: (!) 153/72  (!) 158/71   Pulse: 81  85   Resp: 13  15   Temp:  98.7 F (37.1 C)  98.6 F (37 C)  TempSrc:  Oral  Oral  SpO2: 93%  92%   Weight:      Height:        CBC Recent Labs    03/02/17 0543  WBC 16.7*  HGB 11.9*  HCT 36.6  PLT 257   BMET Recent Labs    03/02/17 0543  NA 138  K 3.8  CL 102  CO2 23  GLUCOSE 120*  BUN 19  CREATININE 1.06*  CALCIUM 9.1    Patient continued to complain of headache. Nursing staff notes though that overall headache is much better than when she was initially admitted. Mild lethargy, which may be due to both head injury and pain medication. However oriented to name, [place], and 2019. Following commands. Moving all 4 extremities well.  Plan: Stable following head injury. Continue supportive care.  Hosie Spangle, MD 03/04/2017, 9:49 AM

## 2017-03-04 NOTE — Evaluation (Signed)
Physical Therapy Evaluation Patient Details Name: Shelly Farrell MRN: [id number] DOB: [birth year] Today's Date: 03/04/2017   History of Present Illness  Pt is a 67 y/o female admitted after sustaining a fall at home in which she struck her head. Pt found to have a skull fracture and cerebral contusion. PMH including but not limited to HTN and neuromuscular disorder.    Clinical Impression  Pt presented supine in bed with HOB elevated, awake and willing to participate in therapy session. Pt's son present throughout session as well. Prior to admission, pt reported that she was independent with all functional mobility and ADLs. Pt lives with her husband and daughter in a two level house with five steps to enter. Pt has a son who is also very supportive. Pt currently limited secondary to headache pain, as well as dizziness and nausea with movement. Pt able to perform bed mobility with min A, transfers with min A and take a few side steps at EOB with min A. Pt would greatly benefit from further intensive therapy services at [facility] to maximize her independence with functional mobility prior to returning home with family. PT will continue to follow acutely to progress mobility as tolerated.     Follow Up Recommendations [facility]    Equipment Recommendations  None recommended by PT    Recommendations for Other Services Rehab consult     Precautions / Restrictions Precautions Precautions: Fall Restrictions Weight Bearing Restrictions: No      Mobility  Bed Mobility Overal bed mobility: Needs Assistance Bed Mobility: Supine to Sit;Sit to Supine     Supine to sit: Min assist Sit to supine: Min guard   General bed mobility comments: increased time and effort, cueing for sequencing/technique, assist to elevate trunk; pt with slow guarded movements  Transfers Overall transfer level: Needs assistance Equipment used: 1 person hand held assist(face-to-face with gait belt and pt holding PT's bilateral  UE) Transfers: Sit to/from Stand Sit to Stand: Min assist         General transfer comment: increased time and effort, cueing for technique and safe hand placement, assist for stability with transitional movement  Ambulation/Gait             General Gait Details: Pt able to take 3-4 lateral steps towards her R side with min A and HHA; pt very limited secondary to fatigue, dizziness and pain despite RN administering pain meds at beginning of session  Stairs            Wheelchair Mobility    Modified Rankin (Stroke Patients Only)       Balance Overall balance assessment: Needs assistance Sitting-balance support: Feet supported Sitting balance-[name] Scale: Fair     Standing balance support: During functional activity;Bilateral upper extremity supported Standing balance-[name] Scale: Poor Standing balance comment: reliant on bilateral UEs and external support                             Pertinent Vitals/Pain Pain Assessment: Faces Faces Pain Scale: Hurts whole lot Pain Location: head (anterior aspect) Pain Descriptors / Indicators: Sore;Guarding;Grimacing Pain Intervention(s): Monitored during session;Repositioned;Patient requesting pain meds-RN notified;RN gave pain meds during session    Home Living Family/patient expects to be discharged to:: Private residence Living Arrangements: Spouse/significant other;Children Available Help at Discharge: Family;Available PRN/intermittently Type of Home: House Home Access: Stairs to enter Entrance Stairs-Rails: Right;Left Entrance Stairs-Number of Steps: 5 Home Layout: Two level;Able to live on main level with bedroom/bathroom  Home Equipment: None      Prior Function Level of Independence: Independent               Hand Dominance        Extremity/Trunk Assessment   Upper Extremity Assessment Upper Extremity Assessment: Generalized weakness    Lower Extremity Assessment Lower Extremity  Assessment: Generalized weakness       Communication   Communication: HOH  Cognition Arousal/Alertness: Awake/alert Behavior During Therapy: Flat affect Overall Cognitive Status: Impaired/Different from baseline Area of Impairment: Memory;Safety/judgement;Problem solving                     Memory: Decreased short-term memory;Decreased recall of precautions   Safety/Judgement: Decreased awareness of deficits;Decreased awareness of safety   Problem Solving: Difficulty sequencing;Requires verbal cues        General Comments General comments (skin integrity, edema, etc.): All VSS throughout    Exercises     Assessment/Plan    PT Assessment Patient needs continued PT services  PT Problem List Decreased strength;Decreased activity tolerance;Decreased mobility;Decreased balance;Decreased coordination;Decreased cognition;Decreased knowledge of use of DME;Decreased knowledge of precautions;Decreased safety awareness;Pain       PT Treatment Interventions DME instruction;Gait training;Stair training;Functional mobility training;Therapeutic activities;Therapeutic exercise;Balance training;Neuromuscular re-education;Cognitive remediation;Patient/family education    PT Goals (Current goals can be found in the Care Plan section)  Acute Rehab PT Goals Patient Stated Goal: decrease pain PT Goal Formulation: With patient/family Time For Goal Achievement: 03/18/17 Potential to Achieve Goals: Good    Frequency Min 3X/week   Barriers to discharge        Co-evaluation               AM-PAC PT "6 Clicks" Daily Activity  Outcome Measure Difficulty turning over in bed (including adjusting bedclothes, sheets and blankets)?: A Lot Difficulty moving from lying on back to sitting on the side of the bed? : Unable Difficulty sitting down on and standing up from a chair with arms (e.g., wheelchair, bedside commode, etc,.)?: Unable Help needed moving to and from a bed to chair  (including a wheelchair)?: A Little Help needed walking in hospital room?: A Lot Help needed climbing 3-5 steps with a railing? : Total 6 Click Score: 10    End of Session Equipment Utilized During Treatment: Gait belt;Oxygen Activity Tolerance: Patient limited by fatigue;Patient limited by pain Patient left: in bed;with call bell/phone within reach;with bed alarm set;with family/visitor present Nurse Communication: Mobility status PT Visit Diagnosis: Other abnormalities of gait and mobility (R26.89)    Time: 8250-0370 PT Time Calculation (min) (ACUTE ONLY): 32 min   Charges:   PT Evaluation $PT Eval Moderate Complexity: 1 Mod PT Treatments $Therapeutic Activity: 8-22 mins   PT G Codes:        Gray, PT, DPT [place] 03/04/2017, 11:51 AM

## 2017-03-04 NOTE — Progress Notes (Signed)
Inpatient Rehabilitation  Per PT request, patient was screened by Gunnar Fusi for appropriateness for an Inpatient Acute Rehab consult.  At this time we are recommending an Inpatient Rehab consult as well as OT eval orders.  Please order if you are agreeable.    Carmelia Roller., CCC/SLP Admission Coordinator  [place]  Cell [phone]

## 2017-03-05 MED ORDER — DEXAMETHASONE SODIUM PHOSPHATE 4 MG/ML IJ SOLN
2.0000 mg | Freq: Four times a day (QID) | INTRAMUSCULAR | Status: AC
  Administered 2017-03-05 – 2017-03-06 (×4): 2 mg via INTRAVENOUS
  Filled 2017-03-05 (×4): qty 1

## 2017-03-05 NOTE — Progress Notes (Signed)
RT Note: The patients nurse called RT to come and see the patient because she was having an increased O2 demand. Upon arriving the patients pulse ox was not picking up well and there was no waveform nor oxygen saturation available. After changing the pulse ox the patient was saturating in the low 80's on her nasal cannula. She was placed on a non rebreather which she tolerated well and her Spo2 increased to 97%. Rt attempted to wean her to a venti mask but she desaturated to the high 80's. She was then placed on a partial rebreather mask with an FiO2 of 60-80% and she tolerated that very well. She is now on that partial rebreather still with an Spo2 of 96%. Her nurse paged the MD to see if they wanted to administer a prn nebulizer treatment and they declined but wanted to continue her on her present oxygen without administering a bronchodilator. Rt will continue to monitor and assist as needed.

## 2017-03-05 NOTE — Progress Notes (Signed)
Spouse in to visit this AM and expressed concern about patient going home on pain medication when discharge. Spouse stated live-in daughter is a heroine addict and steals. Writer spoke with daughter yesterday and she was very transparent about her addiction and stated she does heroine. Writer informed spouse it will be their responsibility to secure narcotics if prescribed upon discharge. Spouse voiced understanding and appreciation.

## 2017-03-05 NOTE — Progress Notes (Signed)
Subjective: Patient reports still c/o bad headache  Objective: Vital signs in last 24 hours: Temp:  [98.5 F (36.9 C)-99.9 F (37.7 C)] 98.9 F (37.2 C) (03/03 0849) Pulse Rate:  [81-95] 81 (03/03 0000) Resp:  [13-18] 13 (03/03 0000) BP: (127-171)/(68-101) 171/81 (03/03 0935) SpO2:  [91 %-94 %] 94 % (03/03 0000)  Intake/Output from previous day: 03/02 0701 - 03/03 0700 In: -  Out: 1550 [Urine:1550] Intake/Output this shift: Total I/O In: -  Out: 300 [Urine:300]  Physical Exam: Awake, alert, conversant.  No drift, no meningismus, photophobic.  Lab Results: No results for input(s): WBC, HGB, HCT, PLT in the last 72 hours. BMET No results for input(s): NA, K, CL, CO2, GLUCOSE, BUN, CREATININE, CALCIUM in the last 72 hours.  Studies/Results: No results found.  Assessment/Plan: Continue pain medication.  Will start low dose decadron to see if this helps with her headaches.    LOS: 2 days    Peggyann Shoals, MD 03/05/2017, 11:37 AM

## 2017-03-06 ENCOUNTER — Inpatient Hospital Stay (HOSPITAL_COMMUNITY): Payer: Medicare Other

## 2017-03-06 LAB — COMPREHENSIVE METABOLIC PANEL
ALT: 81 U/L — ABNORMAL HIGH (ref 14–54)
AST: 65 U/L — ABNORMAL HIGH (ref 15–41)
Albumin: 2.9 g/dL — ABNORMAL LOW (ref 3.5–5.0)
Alkaline Phosphatase: 217 U/L — ABNORMAL HIGH (ref 38–126)
Anion gap: 14 (ref 5–15)
BUN: 15 mg/dL (ref 6–20)
CO2: 26 mmol/L (ref 22–32)
Calcium: 8.9 mg/dL (ref 8.9–10.3)
Chloride: 97 mmol/L — ABNORMAL LOW (ref 101–111)
Creatinine, Ser: 0.8 mg/dL (ref 0.44–1.00)
GFR calc Af Amer: >60 mL/min (ref >60)
GFR calc non Af Amer: >60 mL/min (ref >60)
Glucose, Bld: 106 mg/dL — ABNORMAL HIGH (ref 65–99)
Potassium: 3 mmol/L — ABNORMAL LOW (ref 3.5–5.1)
Sodium: 137 mmol/L (ref 135–145)
Total Bilirubin: 1.9 mg/dL — ABNORMAL HIGH (ref 0.3–1.2)
Total Protein: 6.1 g/dL — ABNORMAL LOW (ref 6.5–8.1)

## 2017-03-06 LAB — BASIC METABOLIC PANEL
Anion gap: 12 (ref 5–15)
BUN: 14 mg/dL (ref 6–20)
CO2: 24 mmol/L (ref 22–32)
Calcium: 8.6 mg/dL — ABNORMAL LOW (ref 8.9–10.3)
Chloride: 100 mmol/L — ABNORMAL LOW (ref 101–111)
Creatinine, Ser: 0.79 mg/dL (ref 0.44–1.00)
GFR calc Af Amer: >60 mL/min (ref >60)
GFR calc non Af Amer: >60 mL/min (ref >60)
Glucose, Bld: 142 mg/dL — ABNORMAL HIGH (ref 65–99)
Potassium: 2.6 mmol/L — CL (ref 3.5–5.1)
Sodium: 136 mmol/L (ref 135–145)

## 2017-03-06 LAB — CBC WITH DIFFERENTIAL/PLATELET
Basophils Absolute: 0 10*3/uL (ref 0.0–0.1)
Basophils Relative: 0 %
Eosinophils Absolute: 0 10*3/uL (ref 0.0–0.7)
Eosinophils Relative: 0 %
HCT: 28.3 % — ABNORMAL LOW (ref 36.0–46.0)
Hemoglobin: 9.5 g/dL — ABNORMAL LOW (ref 12.0–15.0)
Lymphocytes Relative: 4 %
Lymphs Abs: 0.9 10*3/uL (ref 0.7–4.0)
MCH: 30.3 pg (ref 26.0–34.0)
MCHC: 33.6 g/dL (ref 30.0–36.0)
MCV: 90.1 fL (ref 78.0–100.0)
Monocytes Absolute: 0.9 10*3/uL (ref 0.1–1.0)
Monocytes Relative: 4 %
Neutro Abs: 18.5 10*3/uL — ABNORMAL HIGH (ref 1.7–7.7)
Neutrophils Relative %: 92 %
Platelets: 235 10*3/uL (ref 150–400)
RBC: 3.14 MIL/uL — ABNORMAL LOW (ref 3.87–5.11)
RDW: 13.4 % (ref 11.5–15.5)
WBC: 20.3 10*3/uL — ABNORMAL HIGH (ref 4.0–10.5)

## 2017-03-06 LAB — URINALYSIS, ROUTINE W REFLEX MICROSCOPIC
Bacteria, UA: NONE SEEN
Bilirubin Urine: NEGATIVE
Glucose, UA: NEGATIVE mg/dL
Ketones, ur: 5 mg/dL — AB
Nitrite: NEGATIVE
Protein, ur: 30 mg/dL — AB
Specific Gravity, Urine: 1.014 (ref 1.005–1.030)
pH: 5 (ref 5.0–8.0)

## 2017-03-06 MED ORDER — POTASSIUM CHLORIDE IN NACL 40-0.9 MEQ/L-% IV SOLN
INTRAVENOUS | Status: DC
  Administered 2017-03-06: 50 mL/h via INTRAVENOUS
  Filled 2017-03-06 (×2): qty 1000

## 2017-03-06 MED ORDER — IPRATROPIUM-ALBUTEROL 0.5-2.5 (3) MG/3ML IN SOLN: 3.0000 mL | Freq: Four times a day (QID) | RESPIRATORY_TRACT | Status: DC | PRN

## 2017-03-06 MED ORDER — FUROSEMIDE 10 MG/ML IJ SOLN
20.0000 mg | Freq: Once | INTRAMUSCULAR | Status: AC
  Administered 2017-03-06: 20 mg via INTRAVENOUS
  Filled 2017-03-06: qty 2

## 2017-03-06 MED ORDER — FUROSEMIDE 20 MG PO TABS
20.0000 mg | ORAL_TABLET | Freq: Every day | ORAL | Status: DC
  Administered 2017-03-06 – 2017-03-08 (×3): 20 mg via ORAL
  Filled 2017-03-06 (×3): qty 1

## 2017-03-06 MED ORDER — POTASSIUM CHLORIDE 10 MEQ/100ML IV SOLN
10.0000 meq | INTRAVENOUS | Status: AC
  Administered 2017-03-06 (×4): 10 meq via INTRAVENOUS
  Filled 2017-03-06 (×4): qty 100

## 2017-03-06 MED ORDER — SODIUM CHLORIDE 0.9 % IV SOLN
INTRAVENOUS | Status: DC
  Administered 2017-03-07 – 2017-03-08 (×2): via INTRAVENOUS

## 2017-03-06 MED ORDER — POTASSIUM CHLORIDE CRYS ER 20 MEQ PO TBCR
20.0000 meq | EXTENDED_RELEASE_TABLET | Freq: Two times a day (BID) | ORAL | Status: DC
  Administered 2017-03-06 – 2017-03-07 (×2): 20 meq via ORAL
  Filled 2017-03-06 (×2): qty 1

## 2017-03-06 MED ORDER — IPRATROPIUM-ALBUTEROL 0.5-2.5 (3) MG/3ML IN SOLN
RESPIRATORY_TRACT | Status: AC
  Administered 2017-03-06: 3 mL
  Filled 2017-03-06: qty 3

## 2017-03-06 MED ORDER — HALOPERIDOL LACTATE 5 MG/ML IJ SOLN
2.0000 mg | Freq: Four times a day (QID) | INTRAMUSCULAR | Status: DC | PRN
  Administered 2017-03-06 – 2017-03-07 (×2): 2 mg via INTRAVENOUS
  Filled 2017-03-06 (×2): qty 1

## 2017-03-06 NOTE — Progress Notes (Signed)
1900-2200- Patient on non-rebreather mask, Sp02 in mid 90s-100%, A&Ox4, NS running at 12ml/hr, up to the [facility] many times for small solid bowel movements. Patient talkative and pleasant, no nausea, only pain med given was tylenol. Patient would take off mask to drink and put on chap stick and would desat into the low 80s.  0000- patient restless, became confused when O2 was off, took a long time to find her words, still up multiple times to the [facility] for small solid BMs.  0330- patient removed non-rebreather and refused to put it back on again, SpO2 was in the 70s, pt was very agitated. RT was able to coax patient in to a high flow [place] @10L . Called MD, received order for duoneb treatment and labs, brought SpO2 back into the 90s. RN and RT ascultataed bilateral fine crackles and wheezes in patient's lungs.  0430- Patient continuing to complain of difficulty breathing; RN stopped fluids, MD called, received order for portable chest x-ray, Lasix IV, and to place a foley catheter.  0645- lab called critical potassium of 2.6. MD paged again, received order for four runs of potassium and to start NS with 11meq K @ 33ml/hr. Patient  still up multiple times to the [facility] for small solid BMs.

## 2017-03-06 NOTE — Progress Notes (Signed)
Physical Therapy Treatment Patient Details Name: Shelly Farrell MRN: [id number] DOB: 1950-01-19 Today's Date: 03/06/2017    History of Present Illness Pt is a 67 y/o female admitted after sustaining a fall at home in which she struck her head. Pt found to have a skull fracture and cerebral contusion. PMH including but not limited to HTN and neuromuscular disorder.    PT Comments    Continuing work on functional mobility and activity tolerance;  Much improved activity tolerance, able to walk in hallway with min assist (and second person for safety); Decr cognition, decr problem-solving, impulsive -- presenting at a [place] Level V1, Confused-Appropriate; Noting respiratory difficulties earlier this morning, walked with supplemental O2 via high flow [name]   Follow Up Recommendations  [facility]     Equipment Recommendations  None recommended by PT    Recommendations for Other Services Rehab consult     Precautions / Restrictions Precautions Precautions: Fall    Mobility  Bed Mobility                  Transfers Overall transfer level: Needs assistance Equipment used: Rolling walker (2 wheeled) Transfers: Sit to/from Stand Sit to Stand: Min assist         General transfer comment: increased time and effort, cueing for technique and safe hand placement, assist for stability with transitional movement  Ambulation/Gait Ambulation/Gait assistance: Min assist;+2 safety/equipment Ambulation Distance (Feet): (Hallway amb) Assistive device: Rolling walker (2 wheeled) Gait Pattern/deviations: Step-through pattern     General Gait Details: Frequently stops and lets go of RW, usually because she is talking and gesturing with her hands; Cues for safety, pathfinding, and RW use; will impulsively reach for objects (typically lines, and at one point, her socks)   Stairs            Wheelchair Mobility    Modified Rankin (Stroke Patients Only)       Balance     Sitting  balance-[name] Scale: Fair       Standing balance-[name] Scale: Poor(approaching Fair)                              Cognition Arousal/Alertness: Awake/alert Behavior During Therapy: Impulsive Overall Cognitive Status: Impaired/Different from baseline Area of Impairment: Memory;Safety/judgement;Problem solving;Rancho level               [place]ancho Levels of Cognitive Functioning [place] [employer] Scales of Cognitive Functioning: Confused/appropriate     Memory: Decreased short-term memory;Decreased recall of precautions   Safety/Judgement: Decreased awareness of deficits;Decreased awareness of safety   Problem Solving: Difficulty sequencing;Requires verbal cues        Exercises      General Comments General comments (skin integrity, edema, etc.): VSS thoughout on a considerable amount of supplemental O2 via high flow [name]      Pertinent Vitals/Pain Pain Assessment: Faces Faces Pain Scale: Hurts little more Pain Location: head (anterior aspect) Pain Descriptors / Indicators: Sore;Guarding;Grimacing Pain Intervention(s): Monitored during session    Home Living                      Prior Function            PT Goals (current goals can now be found in the care plan section) Acute Rehab PT Goals Patient Stated Goal: agreeable to walk PT Goal Formulation: With patient/family Time For Goal Achievement: 03/18/17 Potential to Achieve Goals: Good Progress towards PT goals: Progressing  toward goals    Frequency    Min 3X/week      PT Plan Current plan remains appropriate    Co-evaluation              AM-PAC PT "6 Clicks" Daily Activity  Outcome Measure  Difficulty turning over in bed (including adjusting bedclothes, sheets and blankets)?: A Little Difficulty moving from lying on back to sitting on the side of the bed? : A Lot Difficulty sitting down on and standing up from a chair with arms (e.g., wheelchair, bedside commode, etc,.)?:  A Little Help needed moving to and from a bed to chair (including a wheelchair)?: A Little Help needed walking in hospital room?: A Little Help needed climbing 3-5 steps with a railing? : A Lot 6 Click Score: 16    End of Session Equipment Utilized During Treatment: Gait belt;Oxygen Activity Tolerance: Patient tolerated treatment well Patient left: in chair;with call bell/phone within reach;with chair alarm set Nurse Communication: Mobility status PT Visit Diagnosis: Other abnormalities of gait and mobility (R26.89)     Time: 4665-9935 PT Time Calculation (min) (ACUTE ONLY): 28 min  Charges:  $Gait Training: 8-22 mins $Therapeutic Activity: 8-22 mins                    G Codes:       Shelly Farrell, PT  Acute Rehabilitation Services Pager [phone] Office [phone]    Shelly Farrell 03/06/2017, 2:20 PM

## 2017-03-06 NOTE — Progress Notes (Addendum)
MD office paged [phone], message to be sent to MD asking for medications/restraints for agitation--as pt confusion continues, agitation increasing, pt continually gets out of bed without calling, taking off all monitors and getting verbally aggressive with staff.  Waiting for call back from MD. MD at bedside 1715, added haldol for agitation.

## 2017-03-06 NOTE — Progress Notes (Signed)
Patient ID: Shelly Farrell, female   DOB: 02-12-1950, 67 y.o.   MRN: [id number] Called because of patient agitation She is alert and oriented x1 only Picking at clothing but responds appropriately to questions Will add haldol for agitation

## 2017-03-06 NOTE — Progress Notes (Signed)
Patient ID: Shelly Farrell, female   DOB: [birth year], 67 y.o.   MRN: [id number] BP (!) 144/99 (BP Location: Left Arm)   Pulse (!) 103   Temp 98.4 F (36.9 C) (Oral)   Resp 18   Ht 5\' 1"  (1.549 m)   Wt 66.9 kg (147 lb 7.8 oz)   SpO2 94%   BMI 27.87 kg/m  Alert, following commands, agitated Speech is clear.  Have placed order for oral K+ Will monitor carefully Moving all extremities

## 2017-03-07 LAB — BASIC METABOLIC PANEL
Anion gap: 11 (ref 5–15)
BUN: 17 mg/dL (ref 6–20)
CO2: 27 mmol/L (ref 22–32)
Calcium: 8.4 mg/dL — ABNORMAL LOW (ref 8.9–10.3)
Chloride: 99 mmol/L — ABNORMAL LOW (ref 101–111)
Creatinine, Ser: 0.97 mg/dL (ref 0.44–1.00)
GFR calc Af Amer: >60 mL/min (ref >60)
GFR calc non Af Amer: 60 mL/min — ABNORMAL LOW (ref >60)
Glucose, Bld: 129 mg/dL — ABNORMAL HIGH (ref 65–99)
Potassium: 2.6 mmol/L — CL (ref 3.5–5.1)
Sodium: 137 mmol/L (ref 135–145)

## 2017-03-07 LAB — URINE CULTURE: Culture: NO GROWTH

## 2017-03-07 LAB — GLUCOSE, CAPILLARY: Glucose-Capillary: 93 mg/dL (ref 65–99)

## 2017-03-07 MED ORDER — POTASSIUM CHLORIDE CRYS ER 20 MEQ PO TBCR
40.0000 meq | EXTENDED_RELEASE_TABLET | ORAL | Status: AC
  Administered 2017-03-07 – 2017-03-08 (×3): 40 meq via ORAL
  Filled 2017-03-07 (×3): qty 2

## 2017-03-07 NOTE — Progress Notes (Signed)
CRITICAL VALUE ALERT  Critical Value:  Potassium 2.6  Date & Time Notied:  03/07/17 20:30  Provider Notified: Dr Trenton Gammon  Orders Received/Actions taken: Potassium 51meq X 3 doses

## 2017-03-07 NOTE — Care Management Important Message (Signed)
Important Message  Patient Details  Name: Shelly Farrell MRN: [id number] Date of Birth: 05-10-1950   Medicare Important Message Given:  Yes    [name] [address] 03/07/2017, 2:47 PM

## 2017-03-07 NOTE — Progress Notes (Signed)
Patient ID: Shelly Farrell, female   DOB: 1950-12-28, 68 y.o.   MRN: [id number] BP 131/67   Pulse 95   Temp 98.5 F (36.9 C) (Oral)   Resp 20   Ht 5\' 1"  (1.549 m)   Wt 66.9 kg (147 lb 7.8 oz)   SpO2 97%   BMI 27.87 kg/m  Alert and oriented x 3 Moving all extremities Improved since yesterday. Will check BMET

## 2017-03-07 NOTE — Evaluation (Signed)
Occupational Therapy Evaluation Patient Details Name: Shelly Farrell MRN: [id number] DOB: 1950-10-17 Today's Date: 03/07/2017    History of Present Illness Pt is a 67 y/o female admitted after sustaining a fall at home in which she struck her head. Pt found to have a skull fracture and cerebral contusion. PMH including but not limited to HTN and neuromuscular disorder.   Clinical Impression   Pt admitted with above. She demonstrates the below listed deficits and will benefit from continued OT to maximize safety and independence with BADLs.  Pt presents to OT with generalized weakness, mild cognitive deficits, decreased activity tolerance, decreased balance, and generalized weakness.  She currently requires min guard assist for ADLs.  She lives with spouse and daughter, who are very supportive.  She was previously independent with ADLs and IADLs including driving.  Recommend HHOT.  Will follow acutely.       Follow Up Recommendations  Home health OT;Supervision/Assistance - 24 hour    Equipment Recommendations  3 in 1 bedside commode    Recommendations for Other Services       Precautions / Restrictions Precautions Precautions: Fall      Mobility Bed Mobility Overal bed mobility: Needs Assistance Bed Mobility: Supine to Sit     Supine to sit: Supervision     General bed mobility comments: supervision for safety - somewhat impulsive   Transfers Overall transfer level: Needs assistance Equipment used: None Transfers: Sit to/from Stand;Stand Pivot Transfers Sit to Stand: Min guard Stand pivot transfers: Min guard       General transfer comment: min guard for safety     Balance Overall balance assessment: Needs assistance Sitting-balance support: Feet supported Sitting balance-[name] Scale: Good Sitting balance - Comments: able to don socks EOB with no LOB    Standing balance support: During functional activity;Bilateral upper extremity supported Standing  balance-[name] Scale: Fair                             ADL either performed or assessed with clinical judgement   ADL Overall ADL's : Needs assistance/impaired Eating/Feeding: Independent   Grooming: Wash/dry hands;Wash/dry face;Oral care;Brushing hair;Min guard;Standing   Upper Body Bathing: Set up;Supervision/ safety;Sitting   Lower Body Bathing: Min guard;Sit to/from stand   Upper Body Dressing : Supervision/safety;Set up;Sitting   Lower Body Dressing: Min guard;Sit to/from stand   Toilet Transfer: Min guard;Ambulation;Comfort height toilet   Toileting- Clothing Manipulation and Hygiene: Min guard;Sit to/from stand       Functional mobility during ADLs: Min guard General ADL Comments: min guard for safety      Vision Baseline Vision/History: Wears glasses Wears Glasses: Reading only Patient Visual Report: No change from baseline Vision Assessment?: Yes Eye Alignment: Within Functional Limits Ocular Range of Motion: Within Functional Limits Alignment/Gaze Preference: Within Defined Limits Tracking/Visual Pursuits: Able to track stimulus in all quads without difficulty Saccades: Within functional limits Visual Fields: No apparent deficits Additional Comments: Pt unablet to read items due to not having glasses      Perception Perception Perception Tested?: Yes   Praxis Praxis Praxis tested?: Within functional limits    Pertinent Vitals/Pain Pain Assessment: No/denies pain     Hand Dominance Right   Extremity/Trunk Assessment Upper Extremity Assessment Upper Extremity Assessment: Overall WFL for tasks assessed   Lower Extremity Assessment Lower Extremity Assessment: Defer to PT evaluation   Cervical / Trunk Assessment Cervical / Trunk Assessment: Normal   Communication Communication Communication: [facility]  Cognition Arousal/Alertness: Awake/alert Behavior During Therapy: Impulsive Overall Cognitive Status: Impaired/Different from  baseline Area of Impairment: Attention;Following commands;Safety/judgement;Problem solving;Awareness               Rancho Levels of Cognitive Functioning Rancho Los Amigos Scales of Cognitive Functioning: Automatic/appropriate   Current Attention Level: Selective   Following Commands: Follows one step commands consistently;Follows multi-step commands inconsistently Safety/Judgement: Decreased awareness of safety Awareness: Emergent;Anticipatory Problem Solving: Slow processing General Comments: Pt demonstrates beginning awareness of how deficits may impact her functionally.  She is fully oriented.    General Comments  02 sats on RA at rest 93%.  With ambulation 84-91%, with DOE 3/4.   Pt left on 2.5L with sats 95% - RN aware.  HR 118    Exercises     Shoulder Instructions      Home Living Family/patient expects to be discharged to:: Private residence Living Arrangements: Spouse/significant other;Children Available Help at Discharge: Family;Available PRN/intermittently Type of Home: House Home Access: Stairs to enter [employer] of Steps: 5 Entrance Stairs-Rails: Right;Left Home Layout: Two level;Able to live on main level with bedroom/bathroom Alternate Level Stairs-Number of Steps: flight   Bathroom Shower/Tub: Tub/shower unit         Home Equipment: None          Prior Functioning/Environment Level of Independence: Independent        Comments: Pt reports she drives, grocery shops, manages meds.  She and spouse manage financial responsibilities together         OT Problem List: Decreased activity tolerance;Decreased strength;Impaired balance (sitting and/or standing);Decreased cognition;Decreased safety awareness;Cardiopulmonary status limiting activity      OT Treatment/Interventions: Self-care/ADL training;DME and/or AE instruction;Therapeutic activities;Cognitive remediation/compensation;Patient/family education;Balance training    OT  Goals(Current goals can be found in the care plan section) Acute Rehab OT Goals Patient Stated Goal: to get better  OT Goal Formulation: With patient Time For Goal Achievement: 03/21/17 Potential to Achieve Goals: Good ADL Goals Pt Will Perform Grooming: with modified independence;standing Pt Will Perform Upper Body Bathing: with modified independence;sitting Pt Will Perform Lower Body Bathing: with modified independence;sit to/from stand Pt Will Perform Upper Body Dressing: with modified independence;sitting Pt Will Perform Lower Body Dressing: with modified independence;sit to/from stand Pt Will Transfer to Toilet: with modified independence;ambulating;regular height toilet;grab bars Pt Will Perform Toileting - Clothing Manipulation and hygiene: with modified independence;sit to/from stand Additional ADL Goal #1: Pt will be able to alternate and divide attention during familiar ADL tasks Additional ADL Goal #2: Pt will perform path finding with no more than 2 cues.  OT Frequency: Min 2X/week   Barriers to D/C:            Co-evaluation              AM-PAC PT "6 Clicks" Daily Activity     Outcome Measure Help from another person eating meals?: None Help from another person taking care of personal grooming?: A Little Help from another person toileting, which includes using toliet, bedpan, or urinal?: A Little Help from another person bathing (including washing, rinsing, drying)?: A Little Help from another person to put on and taking off regular upper body clothing?: A Little Help from another person to put on and taking off regular lower body clothing?: A Little 6 Click Score: 19   End of Session Equipment Utilized During Treatment: Gait belt Nurse Communication: Mobility status  Activity Tolerance: Patient tolerated treatment well Patient left: in chair;with call bell/phone within reach;with chair alarm  set  OT Visit Diagnosis: Unsteadiness on feet (R26.81);Cognitive  communication deficit (R41.841)                Time: 1855-0158 OT Time Calculation (min): 29 min Charges:  OT General Charges $OT Visit: 1 Visit OT Evaluation $OT Eval Moderate Complexity: 1 Mod OT Treatments $Therapeutic Activity: 8-22 mins G-Codes:     [facility], OTR/L [phone]   [name] M 03/07/2017, 5:10 PM

## 2017-03-08 DIAGNOSIS — I69398 Other sequelae of cerebral infarction: Secondary | ICD-10-CM

## 2017-03-08 DIAGNOSIS — R269 Unspecified abnormalities of gait and mobility: Secondary | ICD-10-CM

## 2017-03-08 DIAGNOSIS — S06339A Contusion and laceration of cerebrum, unspecified, with loss of consciousness of unspecified duration, initial encounter: Secondary | ICD-10-CM

## 2017-03-08 DIAGNOSIS — S0291XA Unspecified fracture of skull, initial encounter for closed fracture: Secondary | ICD-10-CM

## 2017-03-08 LAB — BASIC METABOLIC PANEL
Anion gap: 10 (ref 5–15)
BUN: 13 mg/dL (ref 6–20)
CO2: 26 mmol/L (ref 22–32)
Calcium: 8.4 mg/dL — ABNORMAL LOW (ref 8.9–10.3)
Chloride: 104 mmol/L (ref 101–111)
Creatinine, Ser: 0.74 mg/dL (ref 0.44–1.00)
GFR calc Af Amer: >60 mL/min (ref >60)
GFR calc non Af Amer: >60 mL/min (ref >60)
Glucose, Bld: 156 mg/dL — ABNORMAL HIGH (ref 65–99)
Potassium: 3.7 mmol/L (ref 3.5–5.1)
Sodium: 140 mmol/L (ref 135–145)

## 2017-03-08 LAB — CBC
HCT: 29.5 % — ABNORMAL LOW (ref 36.0–46.0)
Hemoglobin: 9.6 g/dL — ABNORMAL LOW (ref 12.0–15.0)
MCH: 29.6 pg (ref 26.0–34.0)
MCHC: 32.5 g/dL (ref 30.0–36.0)
MCV: 91 fL (ref 78.0–100.0)
Platelets: 335 10*3/uL (ref 150–400)
RBC: 3.24 MIL/uL — ABNORMAL LOW (ref 3.87–5.11)
RDW: 13.8 % (ref 11.5–15.5)
WBC: 13.9 10*3/uL — ABNORMAL HIGH (ref 4.0–10.5)

## 2017-03-08 LAB — MAGNESIUM: Magnesium: 1.9 mg/dL (ref 1.7–2.4)

## 2017-03-08 NOTE — Consult Note (Signed)
Physical Medicine and Rehabilitation Consult Reason for Consult: Decreased functional mobility Referring Physician: Dr. Cyndy Freeze   HPI: Shelly Farrell is a 67 y.o. right handed female with history of hypertension. Per chart review patient lives with spouse and daughter. Independent prior to admission. Two-level home with bedroom on main level and 5 steps to entry. Husband works during the day. Daughter can assist. Presented 03/02/2017 after a fall down a staircase while taking her dog outside striking her head. Transient loss of consciousness as well as nausea. CT of the head showed nondisplaced left occipital bone acute fracture. Small hemorrhagic cortical contusion and left posterior cerebellar hemisphere. Small volume subarachnoid hemorrhage along paramedian frontal lobes. CT cervical spine negative. Neurosurgery Dr. Christella Noa with conservative care and monitored. Follow-up cranial CT scan previously seen subarachnoid hemorrhage less apparent. No evidence of new bleeding. Hospital course hypokalemia with supplement added. Currently on mechanical soft diet. Physical therapy evaluation completed with recommendations of physical medicine rehabilitation consult.  Patient states she had some word finding issues prior to her fall. Patient remembers the fall. Patient is able to stand with supervision from a seated position Review of Systems  Constitutional: Negative for chills and fever.  HENT: Negative for hearing loss.   Eyes: Negative for double vision.  Respiratory: Negative for cough and shortness of breath.   Cardiovascular: Negative for chest pain, palpitations and leg swelling.  Gastrointestinal: Positive for nausea.       GERD  Genitourinary: Negative for dysuria, flank pain and hematuria.  Musculoskeletal: Positive for myalgias.  Skin: Negative for rash.  Neurological: Positive for headaches. Negative for seizures.  Psychiatric/Behavioral: Positive for depression.       Anxiety  All  other systems reviewed and are negative.  Past Medical History:  Diagnosis Date  . Anxiety   . Arthritis   . Depression   . GERD (gastroesophageal reflux disease)   . Hyperlipidemia   . Hypertension   . Neuromuscular disorder [facility])    Past Surgical History:  Procedure Laterality Date  . ABDOMINAL HYSTERECTOMY    . TUBAL LIGATION     Family History  Problem Relation Age of Onset  . Cancer Father   . Hepatitis C Daughter    Social History:  reports that she has quit smoking. she has never used smokeless tobacco. She reports that she does not drink alcohol or use drugs. Allergies: No Known Allergies Medications Prior to Admission  Medication Sig Dispense Refill  . amitriptyline (ELAVIL) 50 MG tablet Take 50 mg by mouth at bedtime.    . cholecalciferol (VITAMIN D) 1000 units tablet Take 1,000 Units by mouth daily.    . hydrochlorothiazide (MICROZIDE) 12.5 MG capsule Take 12.5 mg by mouth daily.    Marland Kitchen lisinopril (PRINIVIL,ZESTRIL) 20 MG tablet Take 20 mg by mouth daily.    . Multiple Vitamin (MULTIVITAMIN WITH MINERALS) TABS tablet Take 1 tablet by mouth daily.    Marland Kitchen omega-3 acid ethyl esters (LOVAZA) 1 g capsule Take 1 g by mouth daily.    Marland Kitchen omeprazole (PRILOSEC) 20 MG capsule Take 20 mg by mouth daily.    . Probiotic Product (PROBIOTIC PO) Take 1 tablet by mouth 2 (two) times daily.    . simvastatin (ZOCOR) 20 MG tablet Take 20 mg by mouth daily.      Home: Home Living Family/patient expects to be discharged to:: Private residence Living Arrangements: Spouse/significant other, Children Available Help at Discharge: Family, Available PRN/intermittently Type of Home: House Home Access: Stairs  to enter Entrance Stairs-Number of Steps: 5 Entrance Stairs-Rails: Right, Left Home Layout: Two level, Able to live on main level with bedroom/bathroom Alternate Level Stairs-Number of Steps: flight Bathroom Shower/Tub: Tub/shower unit Home Equipment: None  Functional History: Prior  Function Level of Independence: Independent Comments: Pt reports she drives, grocery shops, manages meds.  She and spouse manage financial responsibilities together  Functional Status:  Mobility: Bed Mobility Overal bed mobility: Needs Assistance Bed Mobility: Supine to Sit Supine to sit: Supervision Sit to supine: Min guard General bed mobility comments: supervision for safety - somewhat impulsive  Transfers Overall transfer level: Needs assistance Equipment used: None Transfers: Sit to/from Stand, Stand Pivot Transfers Sit to Stand: Min guard Stand pivot transfers: Min guard General transfer comment: min guard for safety  Ambulation/Gait Ambulation/Gait assistance: Min assist, +2 safety/equipment Ambulation Distance (Feet): (Hallway amb) Assistive device: Rolling walker (2 wheeled) Gait Pattern/deviations: Step-through pattern General Gait Details: Frequently stops and lets go of RW, usually because she is talking and gesturing with her hands; Cues for safety, pathfinding, and RW use; will impulsively reach for objects (typically lines, and at one point, her socks)    ADL: ADL Overall ADL's : Needs assistance/impaired Eating/Feeding: Independent Grooming: Wash/dry hands, Wash/dry face, Oral care, Brushing hair, Min guard, Standing Upper Body Bathing: Set up, Supervision/ safety, Sitting Lower Body Bathing: Min guard, Sit to/from stand Upper Body Dressing : Supervision/safety, Set up, Sitting Lower Body Dressing: Min guard, Sit to/from stand Toilet Transfer: Min guard, Ambulation, Comfort height toilet Toileting- Clothing Manipulation and Hygiene: Min guard, Sit to/from stand Functional mobility during ADLs: Min guard General ADL Comments: min guard for safety   Cognition: Cognition Overall Cognitive Status: Impaired/Different from baseline Orientation Level: Oriented X4 [employer] Scales of Cognitive Functioning:  Automatic/appropriate Cognition Arousal/Alertness: Awake/alert Behavior During Therapy: Impulsive Overall Cognitive Status: Impaired/Different from baseline Area of Impairment: Attention, Following commands, Safety/judgement, Problem solving, Awareness Current Attention Level: Selective Memory: Decreased short-term memory, Decreased recall of precautions Following Commands: Follows one step commands consistently, Follows multi-step commands inconsistently Safety/Judgement: Decreased awareness of safety Awareness: Emergent, Anticipatory Problem Solving: Slow processing General Comments: Pt demonstrates beginning awareness of how deficits may impact her functionally.  She is fully oriented.   Blood pressure (!) 143/75, pulse 95, temperature 98.6 F (37 C), temperature source Oral, resp. rate 18, height 5\' 1"  (1.549 m), weight 66.9 kg (147 lb 7.8 oz), SpO2 98 %. Physical Exam  Vitals reviewed. Constitutional: She appears well-developed.  HENT:  Head: Normocephalic.  Eyes: EOM are normal.  Neck: Normal range of motion. Neck supple. No thyromegaly present.  Cardiovascular: Normal rate, regular rhythm and normal heart sounds.  Respiratory: Effort normal and breath sounds normal. No respiratory distress.  GI: Soft. Bowel sounds are normal. She exhibits no distension.  Neurological:  . Makes good eye contact with examiner. Provides her name and age. She could not recall full events of the fall. Followed simple commands.  Skin: Skin is warm and dry.  Patient is oriented to person place and time He is seated and alert  Results for orders placed or performed during the hospital encounter of 03/02/17 (from the past 24 hour(s))  Glucose, capillary     Status: None   Collection Time: 03/07/17  8:12 AM  Result Value Ref Range   Glucose-Capillary 93 65 - 99 mg/dL  Basic metabolic panel     Status: Abnormal   Collection Time: 03/07/17  7:10 PM  Result Value Ref Range   Sodium  137 135 - 145  mmol/L   Potassium 2.6 (LL) 3.5 - 5.1 mmol/L   Chloride 99 (L) 101 - 111 mmol/L   CO2 27 22 - 32 mmol/L   Glucose, Bld 129 (H) 65 - 99 mg/dL   BUN 17 6 - 20 mg/dL   Creatinine, Ser 0.97 0.44 - 1.00 mg/dL   Calcium 8.4 (L) 8.9 - 10.3 mg/dL   GFR calc non Af Amer 60 (L) >60 mL/min   GFR calc Af Amer >60 >60 mL/min   Anion gap 11 5 - 15   No results found.  Assessment/Plan: Diagnosis: Occipital hemorrhagic contusion with gait imbalance 1. Does the need for close, 24 hr/day medical supervision in concert with the patient's rehab needs make it unreasonable for this patient to be served in a less intensive setting? No 2. Co-Morbidities requiring supervision/potential complications: Hypo-kalemia, hypertension, occipital fracture 3. Due to medication administration, pain management and patient education, does the patient require 24 hr/day rehab nursing? Potentially 4. Does the patient require coordinated care of a physician, rehab nurse, PT, OT to address physical and functional deficits in the context of the above medical diagnosis(es)? No Addressing deficits in the following areas: balance, bathing and toileting 5. Can the patient actively participate in an intensive therapy program of at least 3 hrs of therapy per day at least 5 days per week? Yes 6. The potential for patient to make measurable gains while on inpatient rehab is Limited based on her high current level of functioning 7. Anticipated functional outcomes upon discharge from inpatient rehab are n/a  with PT, n/a with OT, n/a with SLP. 8. Estimated rehab length of stay to reach the above functional goals is: Not applicable 9. Anticipated D/C setting: Home 10. Anticipated post D/C treatments: [facility] therapy 11. Overall Rehab/Functional Prognosis: excellent  RECOMMENDATIONS: This patient's condition is appropriate for continued rehabilitative care in the following setting: [facility] Therapy Patient has agreed to participate in recommended  program. Yes Note that insurance prior authorization may be required for reimbursement for recommended care.  Comment:   Charlett Blake M.D. [facility] FAAPM&R (Sports Med, Neuromuscular Med) Diplomate Am Board of Electrodiagnostic Med  Cathlyn Parsons, PA-C 03/08/2017

## 2017-03-08 NOTE — Progress Notes (Signed)
Patient foley and Iv removed. Patient belongings are packed up. Patient educated about s/s of when to call doctor and 911.  Patient has no questions. Discharge complete, patient in room having lunch, waiting for bsc and ride.

## 2017-03-08 NOTE — Discharge Summary (Signed)
Physician Discharge Summary  Patient ID: Shelly Farrell MRN: [id number] DOB/AGE: 07-07-1950 67 y.o.  Admit date: 03/02/2017 Discharge date: 03/08/2017  Admission Diagnoses:Skull fracture with cerebral contusion  Discharge Diagnoses:  Active Problems:   Skull fracture with cerebral contusion, closed, initial encounter [facility])   Discharged Condition: good  Hospital Course: Shelly Farrell was admitted after falling sustaining a basilar skull fracture and a small cerebellar contusion. She initially complained of a headache and had severe nausea and multiple episodes of emesis. This eventually resolved but then she became quite agitated. This led in quick succession to increased IV fluids which led to pulmonary edema, and then treatment with diuretics. She had a low potassium treated with both oral and IV potassium supplements. She is alert and oriented currently. Rehab feels she is able to go home with supervision, and home health therapies.   Treatments: IV hydration  Discharge Exam: Blood pressure 133/73, pulse 95, temperature 98.8 F (37.1 C), temperature source Oral, resp. rate (!) 25, height 5\' 1"  (1.549 m), weight 66.9 kg (147 lb 7.8 oz), SpO2 98 %. General appearance: alert, cooperative, appears stated age and no distress Neurologic: Alert and oriented X 3, normal strength and tone. Normal symmetric reflexes. Normal coordination and gait  Disposition:  * No surgery found * Discharge Instructions    DME Bedside commode   Complete by:  As directed    Patient needs a bedside commode to treat with the following condition:  Closed head injury   Face-to-face encounter (required for Medicare/Medicaid patients)   Complete by:  As directed    I Ramzi Brathwaite L certify that this patient is under my care and that I, or a nurse practitioner or physician's assistant working with me, had a face-to-face encounter that meets the physician face-to-face encounter requirements with this patient on 03/08/2017.  The encounter with the patient was in whole, or in part for the following medical condition(s) which is the primary reason for home health care (List medical condition): closed head injury   The encounter with the patient was in whole, or in part, for the following medical condition, which is the primary reason for home health care:  closed head injury   I certify that, based on my findings, the following services are medically necessary home health services:  Physical therapy   Reason for Medically Necessary Home Health Services:  Therapy- Personnel officer, Public librarian   My clinical findings support the need for the above services:  Unable to leave home safely without assistance and/or assistive device   Further, I certify that my clinical findings support that this patient is homebound due to:  Unable to leave home safely without assistance   Home Health   Complete by:  As directed    To provide the following care/treatments:   PT OT       Allergies as of 03/08/2017   No Known Allergies     Medication List    TAKE these medications   amitriptyline 50 MG tablet Commonly known as:  ELAVIL Take 50 mg by mouth at bedtime.   cholecalciferol 1000 units tablet Commonly known as:  VITAMIN D Take 1,000 Units by mouth daily.   hydrochlorothiazide 12.5 MG capsule Commonly known as:  MICROZIDE Take 12.5 mg by mouth daily.   lisinopril 20 MG tablet Commonly known as:  PRINIVIL,ZESTRIL Take 20 mg by mouth daily.   multivitamin with minerals Tabs tablet Take 1 tablet by mouth daily.   omega-3 acid ethyl esters  1 g capsule Commonly known as:  LOVAZA Take 1 g by mouth daily.   omeprazole 20 MG capsule Commonly known as:  PRILOSEC Take 20 mg by mouth daily.   PROBIOTIC PO Take 1 tablet by mouth 2 (two) times daily.   simvastatin 20 MG tablet Commonly known as:  ZOCOR Take 20 mg by mouth daily.            Durable Medical Equipment  (From admission,  onward)        Start     Ordered   03/08/17 0000  DME Bedside commode    Question:  Patient needs a bedside commode to treat with the following condition  Answer:  Closed head injury   03/08/17 1030     Follow-up Information    Ashok Pall, MD Follow up in 3 week(s).   Specialty:  Neurosurgery Why:  please call to make an appointment Contact information: [address] [place] [address] [phone]           Signed: Winfield Cunas 03/08/2017, 10:32 AM

## 2017-03-08 NOTE — Progress Notes (Signed)
Occupational Therapy Treatment Patient Details Name: Shelly Farrell MRN: [id number] DOB: 05-08-1950 Today's Date: 03/08/2017    History of present illness Pt is a 67 y/o female admitted after sustaining a fall at home in which she struck her head. Pt found to have a skull fracture and cerebral contusion. PMH including but not limited to HTN and neuromuscular disorder.   OT comments  Upon arrival, pt awake sitting up in recliner. Providing MOCA assessment for cognition. Pt with an adjusted score of 8/24 (see cognition section). Pt with deficits significant of memory and attention. Educated pt on need for supervision during ADLs and IADLs at home as well as compensatory techniques such as external memory aides. Pt demonstrating increased awareness to verbalize understanding of education and provide examples of where she will need assistance at home. Answered all pt questions in preparation for dc later today. Will continue to follow acutely as admitted and continue to recommend follow up with [facility].    Follow Up Recommendations  Home health OT;Supervision/Assistance - 24 hour    Equipment Recommendations  3 in 1 bedside commode    Recommendations for Other Services      Precautions / Restrictions Precautions Precautions: Fall Restrictions Weight Bearing Restrictions: No       Mobility Bed Mobility                  Transfers                      Balance Overall balance assessment: Needs assistance Sitting-balance support: Feet supported Sitting balance-Shelly Farrell Scale: Good                                    ADL either performed or assessed with clinical judgement   ADL Overall ADL's : Needs assistance/impaired                                       General ADL Comments: Administering MOCA and providing education on cognitive deficits, compensatory techniques for cognition, and need for supervision at home by family.      Vision        Perception     Praxis      Cognition Arousal/Alertness: Awake/alert Behavior During Therapy: Impulsive Overall Cognitive Status: Impaired/Different from baseline Area of Impairment: Attention;Following commands;Safety/judgement;Problem solving;Awareness;Memory               Rancho Levels of Cognitive Functioning Rancho Los Amigos Scales of Cognitive Functioning: Automatic/appropriate   Current Attention Level: Selective Memory: Decreased short-term memory;Decreased recall of precautions Following Commands: Follows one step commands consistently;Follows multi-step commands inconsistently Safety/Judgement: Decreased awareness of safety Awareness: Emergent;Anticipatory Problem Solving: Slow processing General Comments: Administered MOCA (with exception of orientation questions). Pt scoring a 7/24 adjusted score. Pt with deficits in executive funcitoning, memory, attention, and abstraction. Pt with difficulty maintaining attention to follow directions. Educated pt on external memory aides for home safety and to have someone supervising her during ADLs and iADLs.         Exercises     Shoulder Instructions       General Comments      Pertinent Vitals/ Pain       Pain Assessment: No/denies pain  Home Living  Prior Functioning/Environment              Frequency  Min 2X/week        Progress Toward Goals  OT Goals(current goals can now be found in the care plan section)  Progress towards OT goals: Progressing toward goals  Acute Rehab OT Goals Patient Stated Goal: to get better  OT Goal Formulation: With patient Time For Goal Achievement: 03/21/17 Potential to Achieve Goals: Good ADL Goals Pt Will Perform Grooming: with modified independence;standing Pt Will Perform Upper Body Bathing: with modified independence;sitting Pt Will Perform Lower Body Bathing: with modified independence;sit  to/from stand Pt Will Perform Upper Body Dressing: with modified independence;sitting Pt Will Perform Lower Body Dressing: with modified independence;sit to/from stand Pt Will Transfer to Toilet: with modified independence;ambulating;regular height toilet;grab bars Pt Will Perform Toileting - Clothing Manipulation and hygiene: with modified independence;sit to/from stand Additional ADL Goal #1: Pt will be able to alternate and divide attention during familiar ADL tasks Additional ADL Goal #2: Pt will perform path finding with no more than 2 cues.  Plan Discharge plan remains appropriate    Co-evaluation                 AM-PAC PT "6 Clicks" Daily Activity     Outcome Measure   Help from another person eating meals?: None Help from another person taking care of personal grooming?: A Little Help from another person toileting, which includes using toliet, bedpan, or urinal?: A Little Help from another person bathing (including washing, rinsing, drying)?: A Little Help from another person to put on and taking off regular upper body clothing?: A Little Help from another person to put on and taking off regular lower body clothing?: A Little 6 Click Score: 19    End of Session    OT Visit Diagnosis: Unsteadiness on feet (R26.81);Cognitive communication deficit (R41.841)   Activity Tolerance Patient tolerated treatment well   Patient Left in chair;with call bell/phone within reach;with chair alarm set   Nurse Communication Mobility status        Time: 2229-7989 OT Time Calculation (min): 27 min  Charges: OT General Charges $OT Visit: 1 Visit OT Treatments $Self Care/Home Management : 8-22 mins $Therapeutic Activity: 8-22 mins  [name] MSOT, OTR/L Acute Rehab Pager: [phone] Office: [place] 03/08/2017, 1:47 PM

## 2017-03-08 NOTE — Care Management Note (Signed)
Case Management Note  Patient Details  Name: Shelly Farrell MRN: [id number] Date of Birth: January 20, 1950  Subjective/Objective:  Pt is a 67 y/o female admitted after sustaining a fall at home in which she struck her head. Pt found to have a skull fracture and cerebral contusion.  PTA, pt independent, lives with husband and adult daughter.                    Action/Plan: Pt medically stable for discharge home today with 24h supervision from family.  She confirms with family that she has RW at home.  Referral to [facility] for [facility] follow up; start of care 24-48h post dc date.  BSC ordered from [facility] for delivery to room prior to dc. Pt weaned from O2; sats 93-100 on RA at rest and with ambulation.    Expected Discharge Date:  03/08/17               Expected Discharge Plan:  [place]  In-House Referral:     Discharge planning Services  CM Consult  Post Acute Care Choice:  Home Health Choice offered to:  Patient  DME Arranged:  3-N-1 DME Agency:     HH Arranged:  OT, PT [place] Agency:  [facility]  Status of Service:  Completed, signed off  If discussed at [place] of Stay Meetings, dates discussed:    Additional Comments:  Reinaldo Raddle, RN, BSN  Trauma/Neuro ICU Case Manager [phone]

## 2017-03-08 NOTE — Progress Notes (Signed)
Physical Therapy Treatment Patient Details Name: Aaliah Jorgenson MRN: [id number] DOB: 07/18/1950 Today's Date: 03/08/2017    History of Present Illness Pt is a 67 y/o female admitted after sustaining a fall at home in which she struck her head. Pt found to have a skull fracture and cerebral contusion. PMH including but not limited to HTN and neuromuscular disorder.    PT Comments    Pt has improved a great amount with oxygenation and gait stability.  Mentation is not back to baseline, pt can be impulsive at times and not fully aware of safety issues.  Should be safe with available assist.    Follow Up Recommendations  Home health PT     Equipment Recommendations  Rolling walker with 5" wheels;3in1 (PT)    Recommendations for Other Services       Precautions / Restrictions Precautions Precautions: Fall Restrictions Weight Bearing Restrictions: No    Mobility  Bed Mobility               General bed mobility comments: up in the chair on arrival  Transfers Overall transfer level: Needs assistance   Transfers: Sit to/from Stand Sit to Stand: Min guard Stand pivot transfers: Supervision;Min guard       General transfer comment: pt mildly unsteady upon initial standing  Ambulation/Gait Ambulation/Gait assistance: Min guard Ambulation Distance (Feet): 450 Feet Assistive device: Rolling walker (2 wheeled) Gait Pattern/deviations: Step-through pattern   Gait velocity interpretation: at or above normal speed for age/gender General Gait Details: after initial mild unsteadiness, pt improved with RW.  Still frequently takes her hands off the RW to use hands to talk.   Stairs            Wheelchair Mobility    Modified Rankin (Stroke Patients Only)       Balance Overall balance assessment: Needs assistance Sitting-balance support: Feet supported Sitting balance-[name] Scale: Good Sitting balance - Comments: able to don socks EOB with no LOB    Standing  balance support: Bilateral upper extremity supported;No upper extremity supported Standing balance-[name] Scale: Fair                              Cognition Arousal/Alertness: Awake/alert Behavior During Therapy: Impulsive Overall Cognitive Status: Impaired/Different from baseline Area of Impairment: Attention;Following commands;Safety/judgement;Problem solving;Awareness;Memory               Rancho Levels of Cognitive Functioning Rancho Los Amigos Scales of Cognitive Functioning: Automatic/appropriate   Current Attention Level: Selective Memory: Decreased short-term memory;Decreased recall of precautions Following Commands: Follows one step commands consistently;Follows multi-step commands inconsistently Safety/Judgement: Decreased awareness of safety Awareness: Emergent;Anticipatory Problem Solving: Slow processing General Comments: Administered MOCA (with exception of orientation questions). Pt scoring a 7/24 adjusted score. Pt with deficits in executive funcitoning, memory, attention, and abstraction. Pt with difficulty maintaining attention to follow directions. Educated pt on external memory aides for home safety and to have someone supervising her during ADLs and iADLs.       Exercises      General Comments General comments (skin integrity, edema, etc.): pt's sats during ambulation on RA were maintained around 93-96% at EHR 110 bpm      Pertinent Vitals/Pain Pain Assessment: No/denies pain    Home Living                      Prior Function  PT Goals (current goals can now be found in the care plan section) Acute Rehab PT Goals Patient Stated Goal: to get better  PT Goal Formulation: With patient/family Time For Goal Achievement: 03/18/17 Potential to Achieve Goals: Good Progress towards PT goals: Progressing toward goals    Frequency    Min 3X/week      PT Plan Current plan remains appropriate    Co-evaluation               AM-PAC PT "6 Clicks" Daily Activity  Outcome Measure  Difficulty turning over in bed (including adjusting bedclothes, sheets and blankets)?: A Little Difficulty moving from lying on back to sitting on the side of the bed? : A Little Difficulty sitting down on and standing up from a chair with arms (e.g., wheelchair, bedside commode, etc,.)?: A Little Help needed moving to and from a bed to chair (including a wheelchair)?: A Little Help needed walking in hospital room?: A Little Help needed climbing 3-5 steps with a railing? : A Little 6 Click Score: 18    End of Session   Activity Tolerance: Patient tolerated treatment well Patient left: in chair;with call bell/phone within reach;with chair alarm set Nurse Communication: Mobility status PT Visit Diagnosis: Other abnormalities of gait and mobility (R26.89)     Time: 4696-2952 PT Time Calculation (min) (ACUTE ONLY): 27 min  Charges:  $Gait Training: 8-22 mins $Therapeutic Activity: 8-22 mins                    G Codes:       2017-03-29  Donnella Sham, PT [phone]  (pager)   [name] Caprisha Bridgett 03/29/17, 2:50 PM

## 2017-06-16 ENCOUNTER — Other Ambulatory Visit: Payer: Self-pay

## 2017-06-16 ENCOUNTER — Emergency Department (HOSPITAL_COMMUNITY)
Admission: EM | Admit: 2017-06-16 | Discharge: 2017-06-16 | Disposition: A | Discharge status: Home / Self Care | Payer: Medicare Other | Attending: Emergency Medicine | Admitting: Emergency Medicine

## 2017-06-16 ENCOUNTER — Encounter (HOSPITAL_COMMUNITY): Payer: Self-pay | Admitting: Emergency Medicine

## 2017-06-16 DIAGNOSIS — R21 Rash and other nonspecific skin eruption: Secondary | ICD-10-CM | POA: Diagnosis present

## 2017-06-16 DIAGNOSIS — Z87891 Personal history of nicotine dependence: Secondary | ICD-10-CM | POA: Insufficient documentation

## 2017-06-16 DIAGNOSIS — I10 Essential (primary) hypertension: Secondary | ICD-10-CM | POA: Insufficient documentation

## 2017-06-16 DIAGNOSIS — L03311 Cellulitis of abdominal wall: Secondary | ICD-10-CM | POA: Diagnosis not present

## 2017-06-16 MED ORDER — DIPHENHYDRAMINE HCL 25 MG PO CAPS
25.0000 mg | ORAL_CAPSULE | Freq: Once | ORAL | Status: AC
  Administered 2017-06-16: 25 mg via ORAL
  Filled 2017-06-16: qty 1

## 2017-06-16 MED ORDER — DOXYCYCLINE HYCLATE 100 MG PO TABS
100.0000 mg | ORAL_TABLET | Freq: Once | ORAL | Status: AC
  Administered 2017-06-16: 100 mg via ORAL
  Filled 2017-06-16: qty 1

## 2017-06-16 MED ORDER — DOXYCYCLINE HYCLATE 100 MG PO CAPS: 100.0000 mg | ORAL_CAPSULE | Freq: Two times a day (BID) | ORAL | 0 refills | Status: DC

## 2017-06-16 NOTE — ED Triage Notes (Signed)
  Patient noticed a dark scab in her belly button on Wednesday and picked at it causing it to bleed.  Patient now has swelling in and around her belly button and it is tight.  Area is warm and painful to touch.  Patient states pain is 4/10

## 2017-06-16 NOTE — Discharge Instructions (Signed)
Please take antibiotic as prescribed.  Take OTC benadryl or use Hydrocortisone 1% cream to help with itchiness.  If you notice no improvement after 2 days, please return for further care.

## 2017-06-16 NOTE — ED Notes (Signed)
Patient given discharge instructions and verbalized understanding.  Patient stable to discharge at this time.  Patient is alert and oriented to baseline.  No distressed noted at this time.  All belongings taken with the patient at discharge.   

## 2017-06-16 NOTE — ED Provider Notes (Signed)
[facility] EMERGENCY DEPARTMENT Provider Note   CSN: [id number] Arrival date & time: 06/16/17  1497     History   Chief Complaint Chief Complaint  Patient presents with  . Abscess  . Insect Bite    HPI Shelly Farrell is a 67 y.o. female.  HPI  67 year old female with history of hypertension, hyperlipidemia, anxiety presenting for evaluation of skin rash.  Patient report for the past 2 days she noticed localized skin irritation around her umbilical region that has since spread, became red, itchy and painful.  She also noticed some oozing of fluid coming from her bellybutton that she tries to squeeze it.  Pain is mild itchiness is moderate.  She denies associated fever, chills, nausea, vomiting, diarrhea, constipation.  She denies seeing any insect bite, denies any recent travel, or tick exposure.  She is up-to-date with tetanus.  She does take Tylenol on a regular basis for her headache.  Past Medical History:  Diagnosis Date  . Anxiety   . Arthritis   . Depression   . GERD (gastroesophageal reflux disease)   . Hyperlipidemia   . Hypertension   . Neuromuscular disorder [facility])     Patient Active Problem List   Diagnosis Date Noted  . Skull fracture with cerebral contusion, closed, initial encounter ([place]) 03/02/2017    Past Surgical History:  Procedure Laterality Date  . ABDOMINAL HYSTERECTOMY    . TUBAL LIGATION       OB History   None      Home Medications    Prior to Admission medications   Medication Sig Start Date End Date Taking? Authorizing Provider  amitriptyline (ELAVIL) 50 MG tablet Take 50 mg by mouth at bedtime.    [provider]  cholecalciferol (VITAMIN D) 1000 units tablet Take 1,000 Units by mouth daily.    [provider]  hydrochlorothiazide (MICROZIDE) 12.5 MG capsule Take 12.5 mg by mouth daily.    [provider]  lisinopril (PRINIVIL,ZESTRIL) 20 MG tablet Take 20 mg by mouth daily.    [provider]  Multiple Vitamin (MULTIVITAMIN WITH MINERALS) TABS tablet Take 1 tablet by mouth daily.    [provider]  omega-3 acid ethyl esters (LOVAZA) 1 g capsule Take 1 g by mouth daily.    [provider]  omeprazole (PRILOSEC) 20 MG capsule Take 20 mg by mouth daily.    [provider]  Probiotic Product (PROBIOTIC PO) Take 1 tablet by mouth 2 (two) times daily.    [provider]  simvastatin (ZOCOR) 20 MG tablet Take 20 mg by mouth daily.    [provider]    Family History Family History  Problem Relation Age of Onset  . Cancer Father   . Hepatitis C Daughter     Social History Social History   Tobacco Use  . Smoking status: Former Research scientist (life sciences)  . Smokeless tobacco: Never Used  Substance Use Topics  . Alcohol use: No    Alcohol/week: 0.0 oz  . Drug use: No     Allergies   Patient has no known allergies.   Review of Systems Review of Systems  All other systems reviewed and are negative.    Physical Exam Updated Vital Signs BP 132/64 (BP Location: Right Arm)   Pulse 63   Temp 98.7 F (37.1 C) (Oral)   Resp 16   SpO2 99%   Physical Exam  Constitutional: She appears well-developed and well-nourished. No distress.  HENT:  Head:  Atraumatic.  Eyes: Conjunctivae are normal.  Neck: Neck supple.  Cardiovascular: Normal rate and regular rhythm.  Pulmonary/Chest: Effort normal and breath sounds normal.  Abdominal: Soft. There is no tenderness.  Neurological: She is alert.  Skin: Rash (Skin erythema noted surrounding the umbilical region approximately 9 x 5 cm with mild induration.  Small scab noted to umbilical region without any purulent discharge.  It is minimally tender.) noted.  Psychiatric: She has a normal mood and affect.  Nursing note and vitals reviewed.    ED Treatments / Results  Labs (all labs ordered are listed, but only abnormal results are displayed) Labs Reviewed - No data to  display  EKG None  Radiology No results found.  Procedures Procedures (including critical care time)  Medications Ordered in ED Medications  doxycycline (VIBRA-TABS) tablet 100 mg (has no administration in time range)  diphenhydrAMINE (BENADRYL) capsule 25 mg (has no administration in time range)     Initial Impression / Assessment and Plan / ED Course  I have reviewed the triage vital signs and the nursing notes.  Pertinent labs & imaging results that were available during my care of the patient were reviewed by me and considered in my medical decision making (see chart for details).     BP 132/64 (BP Location: Right Arm)   Pulse 63   Temp 98.7 F (37.1 C) (Oral)   Resp 16   SpO2 99%    Final Clinical Impressions(s) / ED Diagnoses   Final diagnoses:  Cellulitis of abdominal wall    ED Discharge Orders        Ordered    doxycycline (VIBRAMYCIN) 100 MG capsule  2 times daily     06/16/17 0718     7:03 AM Patient has localized skin irritation surrounding her umbilical region that appears to be cellulitis.  No obvious abscess appreciated.  There is some small amount of discharge coming from her umbilical region, question urachus, however patient denies ever having drainage coming from her umbilical region.  Patient will benefit from antibiotic for her cellulitis as well as Benadryl as needed for itchiness.  It could be just localized skin irritation due to contact dermatitis but no environmental changes noted and since patient does have some pain she will benefit from antibiotic.  Recommend return after 48 hours if no improvement.  Patient otherwise stable for discharge. Care disdcussed with DR. Lorretta Harp, PA-C 06/16/17 Justin Mend    Charlesetta Shanks, MD 06/16/17 [phone]

## 2017-07-29 ENCOUNTER — Encounter (HOSPITAL_BASED_OUTPATIENT_CLINIC_OR_DEPARTMENT_OTHER): Payer: Self-pay | Admitting: Emergency Medicine

## 2017-07-29 ENCOUNTER — Other Ambulatory Visit: Payer: Self-pay

## 2017-07-29 ENCOUNTER — Emergency Department (HOSPITAL_BASED_OUTPATIENT_CLINIC_OR_DEPARTMENT_OTHER)
Admission: EM | Admit: 2017-07-29 | Discharge: 2017-07-29 | Disposition: A | Discharge status: Home / Self Care | Payer: Medicare Other | Attending: Emergency Medicine | Admitting: Emergency Medicine

## 2017-07-29 DIAGNOSIS — Z79899 Other long term (current) drug therapy: Secondary | ICD-10-CM | POA: Diagnosis not present

## 2017-07-29 DIAGNOSIS — I10 Essential (primary) hypertension: Secondary | ICD-10-CM | POA: Insufficient documentation

## 2017-07-29 DIAGNOSIS — R1084 Generalized abdominal pain: Secondary | ICD-10-CM | POA: Diagnosis not present

## 2017-07-29 DIAGNOSIS — Z87891 Personal history of nicotine dependence: Secondary | ICD-10-CM | POA: Insufficient documentation

## 2017-07-29 DIAGNOSIS — R109 Unspecified abdominal pain: Secondary | ICD-10-CM | POA: Diagnosis present

## 2017-07-29 LAB — COMPREHENSIVE METABOLIC PANEL
ALT: 70 U/L — ABNORMAL HIGH (ref 0–44)
AST: 171 U/L — ABNORMAL HIGH (ref 15–41)
Albumin: 3.6 g/dL (ref 3.5–5.0)
Alkaline Phosphatase: 196 U/L — ABNORMAL HIGH (ref 38–126)
Anion gap: 11 (ref 5–15)
BUN: 17 mg/dL (ref 8–23)
CO2: 27 mmol/L (ref 22–32)
Calcium: 8.9 mg/dL (ref 8.9–10.3)
Chloride: 99 mmol/L (ref 98–111)
Creatinine, Ser: 0.89 mg/dL (ref 0.44–1.00)
GFR calc Af Amer: >60 mL/min (ref >60)
GFR calc non Af Amer: >60 mL/min (ref >60)
Glucose, Bld: 124 mg/dL — ABNORMAL HIGH (ref 70–99)
Potassium: 3.9 mmol/L (ref 3.5–5.1)
Sodium: 137 mmol/L (ref 135–145)
Total Bilirubin: 1.1 mg/dL (ref 0.3–1.2)
Total Protein: 7.2 g/dL (ref 6.5–8.1)

## 2017-07-29 LAB — CBC WITH DIFFERENTIAL/PLATELET
Basophils Absolute: 0 10*3/uL (ref 0.0–0.1)
Basophils Relative: 0 %
Eosinophils Absolute: 0.2 10*3/uL (ref 0.0–0.7)
Eosinophils Relative: 1 %
HCT: 35.2 % — ABNORMAL LOW (ref 36.0–46.0)
Hemoglobin: 11.6 g/dL — ABNORMAL LOW (ref 12.0–15.0)
Lymphocytes Relative: 17 %
Lymphs Abs: 2 10*3/uL (ref 0.7–4.0)
MCH: 30.1 pg (ref 26.0–34.0)
MCHC: 33 g/dL (ref 30.0–36.0)
MCV: 91.4 fL (ref 78.0–100.0)
Monocytes Absolute: 1.1 10*3/uL — ABNORMAL HIGH (ref 0.1–1.0)
Monocytes Relative: 9 %
Neutro Abs: 8.6 10*3/uL — ABNORMAL HIGH (ref 1.7–7.7)
Neutrophils Relative %: 73 %
Platelets: 281 10*3/uL (ref 150–400)
RBC: 3.85 MIL/uL — ABNORMAL LOW (ref 3.87–5.11)
RDW: 13.4 % (ref 11.5–15.5)
WBC: 11.9 10*3/uL — ABNORMAL HIGH (ref 4.0–10.5)

## 2017-07-29 LAB — URINALYSIS, ROUTINE W REFLEX MICROSCOPIC
Bilirubin Urine: NEGATIVE
Glucose, UA: NEGATIVE mg/dL
Hgb urine dipstick: NEGATIVE
Ketones, ur: NEGATIVE mg/dL
Nitrite: NEGATIVE
Protein, ur: NEGATIVE mg/dL
Specific Gravity, Urine: 1.015 (ref 1.005–1.030)
pH: 6 (ref 5.0–8.0)

## 2017-07-29 LAB — URINALYSIS, MICROSCOPIC (REFLEX)

## 2017-07-29 LAB — LIPASE, BLOOD: Lipase: 37 U/L (ref 11–51)

## 2017-07-29 NOTE — ED Notes (Signed)
Pt and FM given d/c instructions as per chart. Verbalize understanding. No questions.

## 2017-07-29 NOTE — ED Triage Notes (Addendum)
Pt c/o abd pain and bloating today; also reports some intermittent chest pain across entire chest; reports middle back pain; also c/o sore throat

## 2017-07-29 NOTE — ED Notes (Signed)
Pt states her abd has gone down a lot since being here. Nontender. BS x 4. HSR. BBS clr.

## 2017-08-06 NOTE — ED Provider Notes (Signed)
[facility] EMERGENCY DEPARTMENT Provider Note   CSN: [id number] Arrival date & time: 07/29/17  1853     History   Chief Complaint Chief Complaint  Patient presents with  . Abdominal Pain    HPI Shelly Farrell is a 67 y.o. female.  HPI   67 year old female with abdominal pain.  Onset earlier today.  Pain diffuse, but worse in upper abdomen.  Felt extremely bloated.  Also some soreness up into her chest and her mid back.  Since coming to the emergency room she states that her symptoms have improved significantly, but not resolved.  She feels a lot less bloated.  No nausea, vomiting or diarrhea.  She has no urinary complaints.  No fevers or chills.  Surgical history pertinent for hysterectomy.  Past Medical History:  Diagnosis Date  . Anxiety   . Arthritis   . Depression   . GERD (gastroesophageal reflux disease)   . Hyperlipidemia   . Hypertension   . Neuromuscular disorder [facility])     Patient Active Problem List   Diagnosis Date Noted  . Skull fracture with cerebral contusion, closed, initial encounter ([place]) 03/02/2017    Past Surgical History:  Procedure Laterality Date  . ABDOMINAL HYSTERECTOMY    . TUBAL LIGATION       OB History   None      Home Medications    Prior to Admission medications   Medication Sig Start Date End Date Taking? Authorizing Provider  amitriptyline (ELAVIL) 50 MG tablet Take 50 mg by mouth at bedtime.    [provider]  cholecalciferol (VITAMIN D) 1000 units tablet Take 1,000 Units by mouth daily.    [provider]  doxycycline (VIBRAMYCIN) 100 MG capsule Take 1 capsule (100 mg total) by mouth 2 (two) times daily. One po bid x 7 days 06/16/17   Domenic Moras, PA-C  hydrochlorothiazide (MICROZIDE) 12.5 MG capsule Take 12.5 mg by mouth daily.    [provider]  lisinopril (PRINIVIL,ZESTRIL) 20 MG tablet Take 20 mg by mouth daily.    [provider]  Multiple Vitamin (MULTIVITAMIN WITH  MINERALS) TABS tablet Take 1 tablet by mouth daily.    [provider]  omega-3 acid ethyl esters (LOVAZA) 1 g capsule Take 1 g by mouth daily.    [provider]  omeprazole (PRILOSEC) 20 MG capsule Take 20 mg by mouth daily.    [provider]  Probiotic Product (PROBIOTIC PO) Take 1 tablet by mouth 2 (two) times daily.    [provider]  simvastatin (ZOCOR) 20 MG tablet Take 20 mg by mouth daily.    [provider]    Family History Family History  Problem Relation Age of Onset  . Cancer Father   . Hepatitis C Daughter     Social History Social History   Tobacco Use  . Smoking status: Former Research scientist (life sciences)  . Smokeless tobacco: Never Used  Substance Use Topics  . Alcohol use: No    Alcohol/week: 0.0 oz  . Drug use: No     Allergies   Patient has no known allergies.   Review of Systems Review of Systems  All systems reviewed and negative, other than as noted in HPI.  Physical Exam Updated Vital Signs BP (!) 103/50   Pulse 76   Temp 98.2 F (36.8 C) (Oral)   Resp 13   Ht 5\' 1"  (1.549 m)   Wt 62.1 kg (137 lb)   SpO2 96%   BMI  25.89 kg/m   Physical Exam  Constitutional: She appears well-developed and well-nourished. No distress.  HENT:  Head: Normocephalic and atraumatic.  Eyes: Conjunctivae are normal. Right eye exhibits no discharge. Left eye exhibits no discharge.  Neck: Neck supple.  Cardiovascular: Normal rate, regular rhythm and normal heart sounds. Exam reveals no gallop and no friction rub.  No murmur heard. Pulmonary/Chest: Effort normal and breath sounds normal. No respiratory distress.  Abdominal: Soft. She exhibits no distension. There is tenderness.  Mild TTP epigastrium  Musculoskeletal: She exhibits no edema or tenderness.  Neurological: She is alert.  Skin: Skin is warm and dry.  Psychiatric: She has a normal mood and affect. Her behavior is normal. Thought content normal.  Nursing note and vitals  reviewed.    ED Treatments / Results  Labs (all labs ordered are listed, but only abnormal results are displayed) Labs Reviewed  CBC WITH DIFFERENTIAL/PLATELET - Abnormal; Notable for the following components:      Result Value   WBC 11.9 (*)    RBC 3.85 (*)    Hemoglobin 11.6 (*)    HCT 35.2 (*)    Neutro Abs 8.6 (*)    Monocytes Absolute 1.1 (*)    All other components within normal limits  COMPREHENSIVE METABOLIC PANEL - Abnormal; Notable for the following components:   Glucose, Bld 124 (*)    AST 171 (*)    ALT 70 (*)    Alkaline Phosphatase 196 (*)    All other components within normal limits  URINALYSIS, ROUTINE W REFLEX MICROSCOPIC - Abnormal; Notable for the following components:   Leukocytes, UA SMALL (*)    All other components within normal limits  URINALYSIS, MICROSCOPIC (REFLEX) - Abnormal; Notable for the following components:   Bacteria, UA FEW (*)    All other components within normal limits  LIPASE, BLOOD    EKG EKG Interpretation  Date/Time:  Saturday July 29 2017 19:16:11 EDT Ventricular Rate:  85 PR Interval:    QRS Duration: 94 QT Interval:  367 QTC Calculation: 437 R Axis:   48 Text Interpretation:  Sinus rhythm Probable left atrial enlargement Confirmed by Virgel Manifold [phone]) on 07/29/2017 8:31:54 PM   Radiology No results found.   Procedures Procedures (including critical care time)  Medications Ordered in ED Medications - No data to display   Initial Impression / Assessment and Plan / ED Course  I have reviewed the triage vital signs and the nursing notes.  Pertinent labs & imaging results that were available during my care of the patient were reviewed by me and considered in my medical decision making (see chart for details).     67 year old female with abdominal pain and bloating now currently always resolved.  She only has minimal epigastric tenderness on exam.  Nauseated, but no vomiting.  She was seen approximately 6 weeks  ago for abdominal cellulitis.  This appears to have resolved.  I doubt bowel obstruction. I doubt appy, AAA or other emergent surgical process. Mild LFT abnormalities.  With the fairly quick improvement of her symptoms and  only minimal tenderness on exam, I do not feel that she requires emergent imaging.  Return precautions were discussed.  She will at least need follow-up labs to monitor LFTs.  Final Clinical Impressions(s) / ED Diagnoses   Final diagnoses:  Generalized abdominal pain    ED Discharge Orders    None       Virgel Manifold, MD 08/06/17 [phone]

## 2018-07-26 ENCOUNTER — Other Ambulatory Visit: Payer: Self-pay

## 2018-07-26 ENCOUNTER — Encounter (HOSPITAL_BASED_OUTPATIENT_CLINIC_OR_DEPARTMENT_OTHER): Payer: Self-pay | Admitting: *Deleted

## 2018-07-26 ENCOUNTER — Observation Stay (HOSPITAL_BASED_OUTPATIENT_CLINIC_OR_DEPARTMENT_OTHER)
Admission: EM | Admit: 2018-07-26 | Discharge: 2018-07-27 | Disposition: A | Discharge status: Home / Self Care | Payer: Medicare Other | Attending: Internal Medicine | Admitting: Internal Medicine

## 2018-07-26 DIAGNOSIS — Z888 Allergy status to other drugs, medicaments and biological substances status: Secondary | ICD-10-CM | POA: Diagnosis not present

## 2018-07-26 DIAGNOSIS — K219 Gastro-esophageal reflux disease without esophagitis: Secondary | ICD-10-CM | POA: Insufficient documentation

## 2018-07-26 DIAGNOSIS — F419 Anxiety disorder, unspecified: Secondary | ICD-10-CM | POA: Insufficient documentation

## 2018-07-26 DIAGNOSIS — Z87891 Personal history of nicotine dependence: Secondary | ICD-10-CM | POA: Diagnosis not present

## 2018-07-26 DIAGNOSIS — Z79899 Other long term (current) drug therapy: Secondary | ICD-10-CM | POA: Insufficient documentation

## 2018-07-26 DIAGNOSIS — Z0184 Encounter for antibody response examination: Secondary | ICD-10-CM | POA: Diagnosis not present

## 2018-07-26 DIAGNOSIS — Z1159 Encounter for screening for other viral diseases: Secondary | ICD-10-CM | POA: Insufficient documentation

## 2018-07-26 DIAGNOSIS — F32A Depression, unspecified: Secondary | ICD-10-CM

## 2018-07-26 DIAGNOSIS — E785 Hyperlipidemia, unspecified: Secondary | ICD-10-CM | POA: Insufficient documentation

## 2018-07-26 DIAGNOSIS — I10 Essential (primary) hypertension: Secondary | ICD-10-CM | POA: Diagnosis not present

## 2018-07-26 DIAGNOSIS — R1310 Dysphagia, unspecified: Secondary | ICD-10-CM | POA: Diagnosis not present

## 2018-07-26 DIAGNOSIS — X58XXXA Exposure to other specified factors, initial encounter: Secondary | ICD-10-CM | POA: Diagnosis not present

## 2018-07-26 DIAGNOSIS — T783XXA Angioneurotic edema, initial encounter: Secondary | ICD-10-CM | POA: Diagnosis present

## 2018-07-26 DIAGNOSIS — T464X5A Adverse effect of angiotensin-converting-enzyme inhibitors, initial encounter: Secondary | ICD-10-CM | POA: Diagnosis not present

## 2018-07-26 DIAGNOSIS — F329 Major depressive disorder, single episode, unspecified: Secondary | ICD-10-CM | POA: Insufficient documentation

## 2018-07-26 LAB — BASIC METABOLIC PANEL
Anion gap: 13 (ref 5–15)
BUN: 18 mg/dL (ref 8–23)
CO2: 24 mmol/L (ref 22–32)
Calcium: 9.5 mg/dL (ref 8.9–10.3)
Chloride: 100 mmol/L (ref 98–111)
Creatinine, Ser: 0.86 mg/dL (ref 0.44–1.00)
GFR calc Af Amer: >60 mL/min (ref >60)
GFR calc non Af Amer: >60 mL/min (ref >60)
Glucose, Bld: 103 mg/dL — ABNORMAL HIGH (ref 70–99)
Potassium: 3.5 mmol/L (ref 3.5–5.1)
Sodium: 137 mmol/L (ref 135–145)

## 2018-07-26 LAB — CBC WITH DIFFERENTIAL/PLATELET
Abs Immature Granulocytes: 0.03 10*3/uL (ref 0.00–0.07)
Basophils Absolute: 0.1 10*3/uL (ref 0.0–0.1)
Basophils Relative: 1 %
Eosinophils Absolute: 0.2 10*3/uL (ref 0.0–0.5)
Eosinophils Relative: 2 %
HCT: 42.8 % (ref 36.0–46.0)
Hemoglobin: 13.7 g/dL (ref 12.0–15.0)
Immature Granulocytes: 0 %
Lymphocytes Relative: 31 %
Lymphs Abs: 3.5 10*3/uL (ref 0.7–4.0)
MCH: 29.5 pg (ref 26.0–34.0)
MCHC: 32 g/dL (ref 30.0–36.0)
MCV: 92.2 fL (ref 80.0–100.0)
Monocytes Absolute: 0.8 10*3/uL (ref 0.1–1.0)
Monocytes Relative: 7 %
Neutro Abs: 6.6 10*3/uL (ref 1.7–7.7)
Neutrophils Relative %: 59 %
Platelets: 316 10*3/uL (ref 150–400)
RBC: 4.64 MIL/uL (ref 3.87–5.11)
RDW: 13.4 % (ref 11.5–15.5)
WBC: 11.2 10*3/uL — ABNORMAL HIGH (ref 4.0–10.5)
nRBC: 0 % (ref 0.0–0.2)

## 2018-07-26 LAB — SARS CORONAVIRUS 2 BY RT PCR (HOSPITAL ORDER, PERFORMED IN ~~LOC~~ HOSPITAL LAB): SARS Coronavirus 2: NEGATIVE

## 2018-07-26 MED ORDER — FAMOTIDINE 20 MG PO TABS
10.0000 mg | ORAL_TABLET | Freq: Two times a day (BID) | ORAL | Status: DC
  Administered 2018-07-26 – 2018-07-27 (×2): 10 mg via ORAL
  Filled 2018-07-26 (×2): qty 1

## 2018-07-26 MED ORDER — DIPHENHYDRAMINE HCL 50 MG/ML IJ SOLN: 25.0000 mg | INTRAMUSCULAR | Status: DC | PRN

## 2018-07-26 MED ORDER — ONDANSETRON HCL 4 MG PO TABS: 4.0000 mg | ORAL_TABLET | Freq: Four times a day (QID) | ORAL | Status: DC | PRN

## 2018-07-26 MED ORDER — FAMOTIDINE IN NACL 20-0.9 MG/50ML-% IV SOLN
20.0000 mg | Freq: Once | INTRAVENOUS | Status: AC
  Administered 2018-07-26: 20 mg via INTRAVENOUS
  Filled 2018-07-26: qty 50

## 2018-07-26 MED ORDER — AMITRIPTYLINE HCL 25 MG PO TABS
50.0000 mg | ORAL_TABLET | Freq: Every day | ORAL | Status: DC
  Administered 2018-07-26: 50 mg via ORAL
  Filled 2018-07-26: qty 2

## 2018-07-26 MED ORDER — SIMVASTATIN 10 MG PO TABS
20.0000 mg | ORAL_TABLET | Freq: Every day | ORAL | Status: DC
  Administered 2018-07-26: 20 mg via ORAL
  Filled 2018-07-26: qty 2

## 2018-07-26 MED ORDER — SODIUM CHLORIDE 0.9% FLUSH
3.0000 mL | Freq: Two times a day (BID) | INTRAVENOUS | Status: DC
  Administered 2018-07-26 – 2018-07-27 (×2): 3 mL via INTRAVENOUS

## 2018-07-26 MED ORDER — ACETAMINOPHEN 325 MG PO TABS: 650.0000 mg | ORAL_TABLET | Freq: Four times a day (QID) | ORAL | Status: DC | PRN

## 2018-07-26 MED ORDER — METHYLPREDNISOLONE SODIUM SUCC 125 MG IJ SOLR
125.0000 mg | Freq: Once | INTRAMUSCULAR | Status: AC
  Administered 2018-07-26: 125 mg via INTRAVENOUS
  Filled 2018-07-26: qty 2

## 2018-07-26 MED ORDER — DULOXETINE HCL 30 MG PO CPEP
60.0000 mg | ORAL_CAPSULE | Freq: Every day | ORAL | Status: DC
  Administered 2018-07-26 – 2018-07-27 (×2): 60 mg via ORAL
  Filled 2018-07-26 (×2): qty 2

## 2018-07-26 MED ORDER — ENOXAPARIN SODIUM 40 MG/0.4ML ~~LOC~~ SOLN
40.0000 mg | SUBCUTANEOUS | Status: DC
  Administered 2018-07-26: 40 mg via SUBCUTANEOUS
  Filled 2018-07-26: qty 0.4

## 2018-07-26 MED ORDER — HYDROCHLOROTHIAZIDE 25 MG PO TABS
25.0000 mg | ORAL_TABLET | Freq: Every day | ORAL | Status: DC
  Administered 2018-07-26 – 2018-07-27 (×2): 25 mg via ORAL
  Filled 2018-07-26 (×2): qty 1

## 2018-07-26 MED ORDER — ONDANSETRON HCL 4 MG/2ML IJ SOLN: 4.0000 mg | Freq: Four times a day (QID) | INTRAMUSCULAR | Status: DC | PRN

## 2018-07-26 MED ORDER — PREDNISONE 20 MG PO TABS
40.0000 mg | ORAL_TABLET | Freq: Every day | ORAL | Status: DC
  Administered 2018-07-27: 40 mg via ORAL
  Filled 2018-07-26: qty 2

## 2018-07-26 MED ORDER — DIPHENHYDRAMINE HCL 50 MG/ML IJ SOLN
50.0000 mg | Freq: Once | INTRAMUSCULAR | Status: AC
  Administered 2018-07-26: 50 mg via INTRAVENOUS
  Filled 2018-07-26: qty 1

## 2018-07-26 MED ORDER — ACETAMINOPHEN 650 MG RE SUPP: 650.0000 mg | Freq: Four times a day (QID) | RECTAL | Status: DC | PRN

## 2018-07-26 NOTE — ED Triage Notes (Signed)
Tongue is swollen. She feels like she is having a hard time swallowing. No respiratory distress.

## 2018-07-26 NOTE — ED Notes (Signed)
Called Pt. Daughter to let her know that the Pt. Will be watched for approx. 1 hour or longer for the oral swelling and let her know what we have done so far.  Pt. Said to RN she wanted RN to call and let the daughter Danise Mina know what was going on.

## 2018-07-26 NOTE — H&P (Signed)
History and Physical    Shelly Farrell JKK:[id number] DOB: 1950/05/11 DOA: 07/26/2018  PCP: [facility]  Patient coming from: [facility]  I have personally briefly reviewed patient's old medical records in [place]  Chief Complaint: Swelling of the mouth  HPI: Shelly Farrell is a 68 y.o. 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  Patient states she was in her usual state of health until this morning when she noticed having swelling at the base of her tongue and feeling as if she was having difficulty with swallowing.  She did not notice any swelling of her lips or face.  She denies any associated chest pain, dyspnea, wheezing, abdominal pain, or skin rash.  Patient states she has been taking lisinopril for many years.  She reports one similar but milder event several months ago.  She has not had any recent changes in her medications.  [facility] ED Course:  Initial vitals showed BP 122/74, pulse 86, RR 16, temp 98.3 Fahrenheit, SPO2 99% on room air.  Labs are notable for WBC 11.2, hemoglobin 13.7, platelets 316,000, sodium 137, potassium 3.5, bicarb 24, BUN 18, creatinine 0.86.  SARS-CoV-2 test was negative.  Patient was given IV Solu-Medrol 125 mg, IV Pepcid 20 mg, and IV Benadryl 50 mg.  Hospitalist service was consulted to admit for further evaluation management.  Review of Systems: All systems reviewed and are negative except as documented in history of present illness above.   Past Medical History:  Diagnosis Date  . Anxiety   . Arthritis   . Depression   . GERD (gastroesophageal reflux disease)   . Hyperlipidemia   . Hypertension   . Neuromuscular  disorder [facility])     Past Surgical History:  Procedure Laterality Date  . ABDOMINAL HYSTERECTOMY    . TUBAL LIGATION      Social History:  reports that she has quit smoking. She has never used smokeless tobacco. She reports that she does not drink alcohol or use drugs.  Allergies  Allergen Reactions  . Lisinopril Swelling    Angioedema    Family History  Problem Relation Age of Onset  . Cancer Father   . Hepatitis C Daughter      Prior to Admission medications   Medication Sig Start Date End Date Taking? Authorizing Provider  cholecalciferol (VITAMIN D) 1000 units tablet Take 1,000 Units by mouth daily.   Yes [provider]  DULoxetine (CYMBALTA) 60 MG capsule TAKE 1 CAPSULE BY MOUTH ONCE DAILY 02/14/18  Yes [provider]  hydrochlorothiazide (HYDRODIURIL) 25 MG tablet Take 1 tablet by mouth once daily 10/24/16  Yes [provider]  lisinopril (ZESTRIL) 20 MG tablet TAKE 1 TABLET BY MOUTH ONCE DAILY APPOINTMENT  IS  NEEDED 05/18/16  Yes [provider]  Multiple Vitamin (MULTIVITAMIN WITH MINERALS) TABS tablet Take 1 tablet by mouth daily.   Yes [provider]  simvastatin (ZOCOR) 20 MG tablet TAKE 1 TABLET BY MOUTH AT BEDTIME 04/21/16  Yes [provider]  amitriptyline (ELAVIL) 50 MG tablet Take 50 mg by mouth at bedtime.    [provider]  doxycycline (VIBRAMYCIN) 100 MG capsule Take 1 capsule (100 mg total) by mouth 2 (two) times daily. One po bid x  7 days 06/16/17   Domenic Moras, PA-C  hydrochlorothiazide (MICROZIDE) 12.5 MG capsule Take 12.5 mg by mouth daily.    [provider]  lisinopril (PRINIVIL,ZESTRIL) 20 MG tablet Take 20 mg by mouth daily.    [provider]  omega-3 acid ethyl esters (LOVAZA) 1 g capsule Take 1 g by mouth daily.    [provider]  omeprazole (PRILOSEC) 20 MG capsule Take 20 mg by mouth daily.    [provider]  Probiotic Product (PROBIOTIC PO) Take  1 tablet by mouth 2 (two) times daily.    [provider]  simvastatin (ZOCOR) 20 MG tablet Take 20 mg by mouth daily.    [provider]    Physical Exam: Vitals:   07/26/18 1708 07/26/18 1715 07/26/18 1730 07/26/18 1852  BP: 128/63 122/62 132/66 115/75  Pulse: 79 79 77 71  Resp: 20 12 16 20   Temp:    98.4 F (36.9 C)  TempSrc:    Oral  SpO2: 96% 95% 95% 95%  Weight:      Height:        Constitutional: Resting supine in bed, NAD, calm, comfortable Eyes: PERRL, lids and conjunctivae normal ENMT: Mucous membranes are moist. Posterior pharynx clear of any exudate or lesions.Normal dentition.  Lips and tongue appear normal without active swelling. Neck: normal, supple, no masses. Respiratory: clear to auscultation bilaterally, no wheezing, no crackles, no stridor. Normal respiratory effort. No accessory muscle use.  Cardiovascular: Regular rate and rhythm, no murmurs / rubs / gallops. No extremity edema. 2+ pedal pulses. Abdomen: no tenderness, no masses palpated. No hepatosplenomegaly. Bowel sounds positive.  Musculoskeletal: no clubbing / cyanosis. No joint deformity upper and lower extremities. Good ROM, no contractures. Normal muscle tone.  Skin: no rashes, lesions, ulcers. No induration Neurologic: CN 2-12 grossly intact. Sensation intact, Strength 5/5 in all 4.  Psychiatric: Short-term memory issues noted.  Alert and oriented x 3. Normal mood.     Labs on Admission: I have personally reviewed following labs and imaging studies  CBC: Recent Labs  Lab 07/26/18 1506  WBC 11.2*  NEUTROABS 6.6  HGB 13.7  HCT 42.8  MCV 92.2  PLT 322   Basic Metabolic Panel: Recent Labs  Lab 07/26/18 1506  NA 137  K 3.5  CL 100  CO2 24  GLUCOSE 103*  BUN 18  CREATININE 0.86  CALCIUM 9.5   GFR: Estimated Creatinine Clearance: 55.6 mL/min (by C-G formula based on SCr of 0.86 mg/dL). Liver Function Tests: No results for input(s): AST, ALT, ALKPHOS, BILITOT,  PROT, ALBUMIN in the last 168 hours. No results for input(s): LIPASE, AMYLASE in the last 168 hours. No results for input(s): AMMONIA in the last 168 hours. Coagulation Profile: No results for input(s): INR, PROTIME in the last 168 hours. Cardiac Enzymes: No results for input(s): CKTOTAL, CKMB, CKMBINDEX, TROPONINI in the last 168 hours. BNP (last 3 results) No results for input(s): PROBNP in the last 8760 hours. HbA1C: No results for input(s): HGBA1C in the last 72 hours. CBG: No results for input(s): GLUCAP in the last 168 hours. Lipid Profile: No results for input(s): CHOL, HDL, LDLCALC, TRIG, CHOLHDL, LDLDIRECT in the last 72 hours. Thyroid Function Tests: No results for input(s): TSH, T4TOTAL, FREET4, T3FREE, THYROIDAB in the last 72 hours. Anemia Panel: No results for input(s): VITAMINB12, FOLATE, FERRITIN, TIBC, IRON, RETICCTPCT in the last 72 hours. Urine analysis:    Component Value Date/Time   COLORURINE YELLOW 07/29/2017 2104  APPEARANCEUR CLEAR 07/29/2017 2104   LABSPEC 1.015 07/29/2017 2104   PHURINE 6.0 07/29/2017 2104   GLUCOSEU NEGATIVE 07/29/2017 2104   HGBUR NEGATIVE 07/29/2017 2104   BILIRUBINUR NEGATIVE 07/29/2017 2104   [place] NEGATIVE 07/29/2017 2104   PROTEINUR NEGATIVE 07/29/2017 2104   NITRITE NEGATIVE 07/29/2017 2104   LEUKOCYTESUR SMALL (A) 07/29/2017 2104    Radiological Exams on Admission: No results found.  EKG: Not performed.  Assessment/Plan Principal Problem:   ACE inhibitor-aggravated angioedema, initial encounter Active Problems:   Depression   Hypertension   Hyperlipidemia  Shelly Farrell is a 68 y.o. 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   Angioedema: Suspected due to ACE inhibitor use affecting her  tongue and throat.  Patient was given IV Solu-Medrol, Pepcid, and Benadryl in the ED with improvement.  At time of transfer to [facility] long she does not have any apparent swelling of her lips, tongue, or throat.  Subjectively she feels improved without further difficulty swallowing.  She has not had any skin rash/urticaria.  She is currently protecting her airway and speaking clearly.  She is saturating well on room air. -DC lisinopril, will need to avoid ACE inhibitors indefinitely -Will continue oral prednisone 40 mg daily and Pepcid for now  Hypertension: Currently stable.  Discontinue lisinopril and avoid ACE inhibitor's.  Continue home HCTZ.  Hyperlipidemia: Continue home simvastatin.  Depression with anxiety: Continue home Cymbalta and amitriptyline.   DVT prophylaxis: Lovenox Code Status: Full code, confirmed with patient Family Communication: None present on admission Disposition Plan: Likely discharge home in 1 day Consults called: None Admission status: Observation   Zada Finders MD [facility]  If 7PM-7AM, please contact night-coverage [email or web address]  07/26/2018, 8:06 PM

## 2018-07-26 NOTE — ED Notes (Signed)
Pt. Daughter phone number is [phone] [place]

## 2018-07-26 NOTE — ED Notes (Signed)
Called to give report at [place] in [place] room and will call RN Rosana Hoes back.  Reported to Sec. That [facility] is on way to be there in 30 mins.

## 2018-07-26 NOTE — ED Notes (Signed)
Pt. Can speak and swallow with  No drooling.  Pt. Is able to speak clearly with no trouble breathing.

## 2018-07-26 NOTE — ED Provider Notes (Signed)
Emergency Department Provider Note   I have reviewed the triage vital signs and the nursing notes.   HISTORY  Chief Complaint Oral Swelling   HPI Shelly Farrell is a 68 y.o. female with past medical history reviewed below presents to the emergency department for evaluation of tongue swelling and difficulty swallowing.  She denies shortness of breath.  Patient awoke this morning with symptoms primarily with swelling underneath her tongue.  She does take the blood pressure medication lisinopril with no changes recently.  No rash or itching.  No lightheadedness, chest pain, abdominal discomfort.  She feels that her symptoms are gradually worsening throughout the day.  In the last hour or 2 she has had decreased swelling under her tongue but continues to have difficulty swallowing.  She is not feeling subjectively short of breath.  No prior symptoms like this in the past.  Past Medical History:  Diagnosis Date  . Anxiety   . Arthritis   . Depression   . GERD (gastroesophageal reflux disease)   . Hyperlipidemia   . Hypertension   . Neuromuscular disorder [facility])     Patient Active Problem List   Diagnosis Date Noted  . Skull fracture with cerebral contusion, closed, initial encounter ([place]) 03/02/2017    Past Surgical History:  Procedure Laterality Date  . ABDOMINAL HYSTERECTOMY    . TUBAL LIGATION      Allergies Lisinopril  Family History  Problem Relation Age of Onset  . Cancer Father   . Hepatitis C Daughter     Social History Social History   Tobacco Use  . Smoking status: Former Research scientist (life sciences)  . Smokeless tobacco: Never Used  Substance Use Topics  . Alcohol use: No    Alcohol/week: 0.0 standard drinks  . Drug use: No    Review of Systems  Constitutional: No fever/chills Eyes: No visual changes. ENT: No sore throat. Positive tongue swelling and throat tightness.  Cardiovascular: Denies chest pain. Respiratory: Denies shortness of breath. Gastrointestinal: No  abdominal pain.  No nausea, no vomiting.  No diarrhea.  No constipation. Genitourinary: Negative for dysuria. Musculoskeletal: Negative for back pain. Skin: Negative for rash. Neurological: Negative for headaches, focal weakness or numbness.  10-point ROS otherwise negative.  ____________________________________________   PHYSICAL EXAM:  VITAL SIGNS: ED Triage Vitals  Enc Vitals Group     BP 07/26/18 1449 122/74     Pulse Rate 07/26/18 1449 86     Resp 07/26/18 1449 16     Temp 07/26/18 1449 98.3 F (36.8 C)     Temp Source 07/26/18 1449 Oral     SpO2 07/26/18 1449 99 %     Weight 07/26/18 1448 140 lb (63.5 kg)     Height 07/26/18 1448 5\' 2"  (1.575 m)   Constitutional: Alert and oriented. Well appearing and in no acute distress. Eyes: Conjunctivae are normal.  Head: Atraumatic. Nose: No congestion/rhinnorhea. Mouth/Throat: Mucous membranes are moist.  Oropharynx non-erythematous.  Oropharynx is widely patent.  There is mild swelling over the left side of the tongue and some mild fullness underneath the tongue.  Patient is speaking in a clear voice, managing oral secretions.  No trismus.  Neck: No stridor.   Cardiovascular: Normal rate, regular rhythm. Good peripheral circulation. Grossly normal heart sounds.   Respiratory: Normal respiratory effort.  No retractions. Lungs CTAB. Gastrointestinal: Soft and nontender. No distention.  Musculoskeletal: No lower extremity tenderness nor edema. No gross deformities of extremities. Neurologic:  Normal speech and language. No gross focal neurologic  deficits are appreciated.  Skin:  Skin is warm, dry and intact. No rash noted.  ____________________________________________   LABS (all labs ordered are listed, but only abnormal results are displayed)  Labs Reviewed  BASIC METABOLIC PANEL - Abnormal; Notable for the following components:      Result Value   Glucose, Bld 103 (*)    All other components within normal limits  CBC  WITH DIFFERENTIAL/PLATELET - Abnormal; Notable for the following components:   WBC 11.2 (*)    All other components within normal limits  SARS CORONAVIRUS 2 (HOSPITAL ORDER, [facility])   ____________________________________________  RADIOLOGY  None  ____________________________________________   PROCEDURES  Procedure(s) performed:   Procedures  None  ____________________________________________   INITIAL IMPRESSION / ASSESSMENT AND PLAN / ED COURSE  Pertinent labs & imaging results that were available during my care of the patient were reviewed by me and considered in my medical decision making (see chart for details).   Patient presents to the emergency department for evaluation of tongue swelling and difficulty swallowing.  No shortness of breath.  Normal vital signs.  Visible swelling in the oropharynx is relatively mild but patient is describing some difficulty swallowing.  She is on lisinopril and I suspect this is an acute angioedema reaction.  I discussed this with her.  I will list lisinopril as an allergy.  Plan for steroid and Benadryl here.   03:55 PM  Patient with no worsening symptoms but continues to have some difficulty swallowing.  No shortness of breath or hypoxemia.  Her tongue continues to have some mild edema noted on exam.  Not significantly worse from my initial evaluation.  Her blood work is normal.  I am sending COVID-19 testing and anticipate transfer to hospital for observation.  I do not have FFP, TXA, or other interventions aside from airway management to deal with angioedema at this facility.  I do not feel that the patient requires airway protection at this time but will continue to observe her closely.   04:25 PM  Discussed patient's case with Hospitalist, Dr. Sloan Leiter to request admission. Patient and family (if present) updated with plan. Care transferred to Hospitalist service.  I reviewed all nursing notes, vitals,  pertinent old records, EKGs, labs, imaging (as available).  No symptom worsening or distress at time of transport. COVID negative.   ____________________________________________  FINAL CLINICAL IMPRESSION(S) / ED DIAGNOSES  Final diagnoses:  Angioedema, initial encounter     MEDICATIONS GIVEN DURING THIS VISIT:  Medications  methylPREDNISolone sodium succinate (SOLU-MEDROL) 125 mg/2 mL injection 125 mg (125 mg Intravenous Given 07/26/18 1532)  diphenhydrAMINE (BENADRYL) injection 50 mg (50 mg Intravenous Given 07/26/18 1531)    Note:  This document was prepared using Dragon voice recognition software and may include unintentional dictation errors.  Nanda Quinton, MD Emergency Medicine    Maddy Graham, [facility], MD 07/26/18 [phone]

## 2018-07-27 DIAGNOSIS — T464X5A Adverse effect of angiotensin-converting-enzyme inhibitors, initial encounter: Secondary | ICD-10-CM

## 2018-07-27 DIAGNOSIS — I10 Essential (primary) hypertension: Secondary | ICD-10-CM | POA: Diagnosis not present

## 2018-07-27 DIAGNOSIS — F329 Major depressive disorder, single episode, unspecified: Secondary | ICD-10-CM

## 2018-07-27 DIAGNOSIS — E785 Hyperlipidemia, unspecified: Secondary | ICD-10-CM

## 2018-07-27 DIAGNOSIS — T783XXA Angioneurotic edema, initial encounter: Secondary | ICD-10-CM

## 2018-07-27 LAB — CBC
HCT: 43.4 % (ref 36.0–46.0)
Hemoglobin: 13.5 g/dL (ref 12.0–15.0)
MCH: 29.3 pg (ref 26.0–34.0)
MCHC: 31.1 g/dL (ref 30.0–36.0)
MCV: 94.1 fL (ref 80.0–100.0)
Platelets: 304 10*3/uL (ref 150–400)
RBC: 4.61 MIL/uL (ref 3.87–5.11)
RDW: 13.4 % (ref 11.5–15.5)
WBC: 8.5 10*3/uL (ref 4.0–10.5)
nRBC: 0 % (ref 0.0–0.2)

## 2018-07-27 LAB — BASIC METABOLIC PANEL
Anion gap: 12 (ref 5–15)
BUN: 22 mg/dL (ref 8–23)
CO2: 23 mmol/L (ref 22–32)
Calcium: 9.5 mg/dL (ref 8.9–10.3)
Chloride: 103 mmol/L (ref 98–111)
Creatinine, Ser: 0.84 mg/dL (ref 0.44–1.00)
GFR calc Af Amer: >60 mL/min (ref >60)
GFR calc non Af Amer: >60 mL/min (ref >60)
Glucose, Bld: 149 mg/dL — ABNORMAL HIGH (ref 70–99)
Potassium: 3.4 mmol/L — ABNORMAL LOW (ref 3.5–5.1)
Sodium: 138 mmol/L (ref 135–145)

## 2018-07-27 LAB — HIV ANTIBODY (ROUTINE TESTING W REFLEX): HIV Screen 4th Generation wRfx: NONREACTIVE

## 2018-07-27 MED ORDER — AMLODIPINE BESYLATE 5 MG PO TABS: 5.0000 mg | ORAL_TABLET | Freq: Every day | ORAL | 11 refills | Status: DC

## 2018-07-27 MED ORDER — PREDNISONE 20 MG PO TABS: 40.0000 mg | ORAL_TABLET | Freq: Every day | ORAL | 0 refills | Status: AC
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
## 2019-03-08 ENCOUNTER — Encounter (HOSPITAL_COMMUNITY): Payer: Self-pay | Admitting: Emergency Medicine

## 2019-03-08 ENCOUNTER — Emergency Department (HOSPITAL_COMMUNITY): Payer: Medicare Other

## 2019-03-08 ENCOUNTER — Other Ambulatory Visit: Payer: Self-pay

## 2019-03-08 ENCOUNTER — Inpatient Hospital Stay (HOSPITAL_COMMUNITY): Payer: Medicare Other

## 2019-03-08 ENCOUNTER — Inpatient Hospital Stay (HOSPITAL_COMMUNITY)
Admission: EM | Admit: 2019-03-08 | Discharge: 2019-03-11 | DRG: 065 | Disposition: A | Discharge status: Home / Self Care | Payer: Medicare Other | Attending: Neurology | Admitting: Neurology

## 2019-03-08 ENCOUNTER — Other Ambulatory Visit (HOSPITAL_COMMUNITY): Payer: Medicare Other

## 2019-03-08 DIAGNOSIS — F0390 Unspecified dementia without behavioral disturbance: Secondary | ICD-10-CM | POA: Diagnosis present

## 2019-03-08 DIAGNOSIS — I672 Cerebral atherosclerosis: Secondary | ICD-10-CM | POA: Diagnosis present

## 2019-03-08 DIAGNOSIS — I6389 Other cerebral infarction: Secondary | ICD-10-CM | POA: Diagnosis not present

## 2019-03-08 DIAGNOSIS — R29701 NIHSS score 1: Secondary | ICD-10-CM | POA: Diagnosis present

## 2019-03-08 DIAGNOSIS — F329 Major depressive disorder, single episode, unspecified: Secondary | ICD-10-CM | POA: Diagnosis present

## 2019-03-08 DIAGNOSIS — I609 Nontraumatic subarachnoid hemorrhage, unspecified: Secondary | ICD-10-CM | POA: Diagnosis present

## 2019-03-08 DIAGNOSIS — I639 Cerebral infarction, unspecified: Secondary | ICD-10-CM

## 2019-03-08 DIAGNOSIS — E854 Organ-limited amyloidosis: Secondary | ICD-10-CM | POA: Diagnosis present

## 2019-03-08 DIAGNOSIS — E876 Hypokalemia: Secondary | ICD-10-CM | POA: Diagnosis present

## 2019-03-08 DIAGNOSIS — Z20822 Contact with and (suspected) exposure to covid-19: Secondary | ICD-10-CM | POA: Diagnosis present

## 2019-03-08 DIAGNOSIS — Z87820 Personal history of traumatic brain injury: Secondary | ICD-10-CM

## 2019-03-08 DIAGNOSIS — E785 Hyperlipidemia, unspecified: Secondary | ICD-10-CM | POA: Diagnosis present

## 2019-03-08 DIAGNOSIS — K219 Gastro-esophageal reflux disease without esophagitis: Secondary | ICD-10-CM | POA: Diagnosis present

## 2019-03-08 DIAGNOSIS — I10 Essential (primary) hypertension: Secondary | ICD-10-CM | POA: Diagnosis present

## 2019-03-08 DIAGNOSIS — Z87891 Personal history of nicotine dependence: Secondary | ICD-10-CM | POA: Diagnosis not present

## 2019-03-08 DIAGNOSIS — M1990 Unspecified osteoarthritis, unspecified site: Secondary | ICD-10-CM | POA: Diagnosis present

## 2019-03-08 DIAGNOSIS — R4701 Aphasia: Secondary | ICD-10-CM | POA: Diagnosis present

## 2019-03-08 DIAGNOSIS — I680 Cerebral amyloid angiopathy: Secondary | ICD-10-CM | POA: Diagnosis present

## 2019-03-08 DIAGNOSIS — R4189 Other symptoms and signs involving cognitive functions and awareness: Secondary | ICD-10-CM | POA: Diagnosis present

## 2019-03-08 DIAGNOSIS — N390 Urinary tract infection, site not specified: Secondary | ICD-10-CM | POA: Diagnosis present

## 2019-03-08 LAB — APTT: aPTT: 28 seconds (ref 24–36)

## 2019-03-08 LAB — URINALYSIS, ROUTINE W REFLEX MICROSCOPIC
Bilirubin Urine: NEGATIVE
Glucose, UA: NEGATIVE mg/dL
Hgb urine dipstick: NEGATIVE
Ketones, ur: 20 mg/dL — AB
Nitrite: NEGATIVE
Protein, ur: NEGATIVE mg/dL
Specific Gravity, Urine: 1.021 (ref 1.005–1.030)
pH: 5 (ref 5.0–8.0)

## 2019-03-08 LAB — PROTIME-INR
INR: 1 (ref 0.8–1.2)
Prothrombin Time: 13.2 seconds (ref 11.4–15.2)

## 2019-03-08 LAB — RESPIRATORY PANEL BY RT PCR (FLU A&B, COVID)
Influenza A by PCR: NEGATIVE
Influenza B by PCR: NEGATIVE
SARS Coronavirus 2 by RT PCR: NEGATIVE

## 2019-03-08 LAB — COMPREHENSIVE METABOLIC PANEL
ALT: 23 U/L (ref 0–44)
AST: 23 U/L (ref 15–41)
Albumin: 3.8 g/dL (ref 3.5–5.0)
Alkaline Phosphatase: 95 U/L (ref 38–126)
Anion gap: 13 (ref 5–15)
BUN: 12 mg/dL (ref 8–23)
CO2: 23 mmol/L (ref 22–32)
Calcium: 9.1 mg/dL (ref 8.9–10.3)
Chloride: 103 mmol/L (ref 98–111)
Creatinine, Ser: 0.77 mg/dL (ref 0.44–1.00)
GFR calc Af Amer: >60 mL/min (ref >60)
GFR calc non Af Amer: >60 mL/min (ref >60)
Glucose, Bld: 119 mg/dL — ABNORMAL HIGH (ref 70–99)
Potassium: 3.6 mmol/L (ref 3.5–5.1)
Sodium: 139 mmol/L (ref 135–145)
Total Bilirubin: 0.8 mg/dL (ref 0.3–1.2)
Total Protein: 6.4 g/dL — ABNORMAL LOW (ref 6.5–8.1)

## 2019-03-08 LAB — CBC
HCT: 42 % (ref 36.0–46.0)
Hemoglobin: 13.8 g/dL (ref 12.0–15.0)
MCH: 30 pg (ref 26.0–34.0)
MCHC: 32.9 g/dL (ref 30.0–36.0)
MCV: 91.3 fL (ref 80.0–100.0)
Platelets: 294 10*3/uL (ref 150–400)
RBC: 4.6 MIL/uL (ref 3.87–5.11)
RDW: 12.9 % (ref 11.5–15.5)
WBC: 9.7 10*3/uL (ref 4.0–10.5)
nRBC: 0 % (ref 0.0–0.2)

## 2019-03-08 LAB — LIPASE, BLOOD: Lipase: 27 U/L (ref 11–51)

## 2019-03-08 LAB — MRSA PCR SCREENING: MRSA by PCR: NEGATIVE

## 2019-03-08 MED ORDER — IOHEXOL 350 MG/ML SOLN: 50.0000 mL | Freq: Once | INTRAVENOUS | Status: DC | PRN

## 2019-03-08 MED ORDER — ACETAMINOPHEN 650 MG RE SUPP: 650.0000 mg | RECTAL | Status: DC | PRN

## 2019-03-08 MED ORDER — SODIUM CHLORIDE 0.9% FLUSH
3.0000 mL | Freq: Once | INTRAVENOUS | Status: AC
  Administered 2019-03-08: 11:00:00 3 mL via INTRAVENOUS

## 2019-03-08 MED ORDER — ONDANSETRON HCL 4 MG/2ML IJ SOLN
4.0000 mg | Freq: Once | INTRAMUSCULAR | Status: AC
  Administered 2019-03-08: 4 mg via INTRAVENOUS
  Filled 2019-03-08: qty 2

## 2019-03-08 MED ORDER — STROKE: EARLY STAGES OF RECOVERY BOOK
Freq: Once | Status: AC
  Filled 2019-03-08: qty 1

## 2019-03-08 MED ORDER — SENNOSIDES-DOCUSATE SODIUM 8.6-50 MG PO TABS
1.0000 | ORAL_TABLET | Freq: Two times a day (BID) | ORAL | Status: DC
  Administered 2019-03-08 – 2019-03-11 (×6): 1 via ORAL
  Filled 2019-03-08 (×5): qty 1

## 2019-03-08 MED ORDER — PROMETHAZINE HCL 25 MG/ML IJ SOLN
12.5000 mg | Freq: Once | INTRAMUSCULAR | Status: AC
  Administered 2019-03-08: 12.5 mg via INTRAVENOUS
  Filled 2019-03-08: qty 1

## 2019-03-08 MED ORDER — CHLORHEXIDINE GLUCONATE CLOTH 2 % EX PADS
6.0000 | MEDICATED_PAD | Freq: Every day | CUTANEOUS | Status: DC
  Administered 2019-03-08 – 2019-03-09 (×2): 6 via TOPICAL

## 2019-03-08 MED ORDER — HYDRALAZINE HCL 20 MG/ML IJ SOLN
10.0000 mg | Freq: Once | INTRAMUSCULAR | Status: AC
  Administered 2019-03-08: 10 mg via INTRAVENOUS
  Filled 2019-03-08: qty 1

## 2019-03-08 MED ORDER — BUTALBITAL-APAP-CAFFEINE 50-325-40 MG PO TABS: 1.0000 | ORAL_TABLET | ORAL | Status: DC | PRN

## 2019-03-08 MED ORDER — BUTALBITAL-APAP-CAFFEINE 50-325-40 MG PO TABS
1.0000 | ORAL_TABLET | Freq: Four times a day (QID) | ORAL | Status: DC | PRN
  Administered 2019-03-08 – 2019-03-11 (×6): 1 via ORAL
  Filled 2019-03-08 (×6): qty 1

## 2019-03-08 MED ORDER — ACETAMINOPHEN 325 MG PO TABS
650.0000 mg | ORAL_TABLET | ORAL | Status: DC | PRN
  Administered 2019-03-08 – 2019-03-10 (×4): 650 mg via ORAL
  Filled 2019-03-08 (×5): qty 2

## 2019-03-08 MED ORDER — PANTOPRAZOLE SODIUM 40 MG IV SOLR
40.0000 mg | Freq: Every day | INTRAVENOUS | Status: DC
  Administered 2019-03-08: 40 mg via INTRAVENOUS

## 2019-03-08 MED ORDER — ACETAMINOPHEN 160 MG/5ML PO SOLN: 650.0000 mg | ORAL | Status: DC | PRN

## 2019-03-08 MED ORDER — CLEVIDIPINE BUTYRATE 0.5 MG/ML IV EMUL
0.0000 mg/h | INTRAVENOUS | Status: DC
  Administered 2019-03-08: 1 mg/h via INTRAVENOUS
  Administered 2019-03-08 – 2019-03-09 (×2): 4 mg/h via INTRAVENOUS
  Filled 2019-03-08 (×2): qty 50
  Filled 2019-03-08: qty 100
  Filled 2019-03-08: qty 50

## 2019-03-08 MED ORDER — IOHEXOL 350 MG/ML SOLN
100.0000 mL | Freq: Once | INTRAVENOUS | Status: AC | PRN
  Administered 2019-03-08: 100 mL via INTRAVENOUS

## 2019-03-08 NOTE — ED Notes (Signed)
MRI called this RN to come & give pt something for her vomiting so she can better tolerate her scan. Zofran was given upon arrival to MRI dept.
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
## 2019-03-08 NOTE — ED Notes (Signed)
Pt vomiting & showing ST on the monitor.

## 2019-03-08 NOTE — ED Notes (Signed)
Daughter Clearnce Sorrel would like an update [phone]

## 2019-03-08 NOTE — ED Notes (Signed)
Pt transported to MRI, echo is ready for pt when she gets finished.

## 2019-03-08 NOTE — ED Provider Notes (Signed)
[facility] EMERGENCY DEPARTMENT Provider Note   CSN: [id number] Arrival date & time: 03/08/19  [id number]     History Chief Complaint  Patient presents with  . Headache  . Emesis  . Generalized Body Aches  . Chills    Shelly Farrell is a 69 y.o. female.  HPI Patient presents with headache.  On the top of her head.  States severe.  States she has been primary because of it.  States she has been vomiting.  No diarrhea.  States nose is not been running.  No fevers abdominal pain diarrhea.  No known sick contacts.  Nurses note states body aches and runny nose although patient states she does not take anywhere except her head and states her nose was only running when she was vomiting.    Past Medical History:  Diagnosis Date  . Anxiety   . Arthritis   . Depression   . GERD (gastroesophageal reflux disease)   . Hyperlipidemia   . Hypertension   . Neuromuscular disorder [facility])     Patient Active Problem List   Diagnosis Date Noted  . ACE inhibitor-aggravated angioedema, initial encounter 07/26/2018  . ACE inhibitor-aggravated angioedema 07/26/2018  . Depression   . Hypertension   . GERD (gastroesophageal reflux disease)   . Hyperlipidemia   . Skull fracture with cerebral contusion, closed, initial encounter ([facility]) 03/02/2017    Past Surgical History:  Procedure Laterality Date  . ABDOMINAL HYSTERECTOMY    . TUBAL LIGATION       OB History   No obstetric history on file.     Family History  Problem Relation Age of Onset  . Cancer Father   . Hepatitis C Daughter     Social History   Tobacco Use  . Smoking status: Former Research scientist (life sciences)  . Smokeless tobacco: Never Used  Substance Use Topics  . Alcohol use: No    Alcohol/week: 0.0 standard drinks  . Drug use: No    Home Medications Prior to Admission medications   Medication Sig Start Date End Date Taking? Authorizing Provider  amitriptyline (ELAVIL) 50 MG tablet Take 50 mg by mouth at bedtime.     [provider]  amLODipine (NORVASC) 5 MG tablet Take 1 tablet (5 mg total) by mouth daily. 07/27/18 07/27/19  Marcell Anger, MD  cholecalciferol (VITAMIN D) 1000 units tablet Take 1,000 Units by mouth daily.    [provider]  DULoxetine (CYMBALTA) 60 MG capsule TAKE 1 CAPSULE BY MOUTH ONCE DAILY 02/14/18   [provider]  hydrochlorothiazide (HYDRODIURIL) 25 MG tablet Take 1 tablet by mouth once daily 10/24/16   [provider]  Multiple Vitamin (MULTIVITAMIN WITH MINERALS) TABS tablet Take 1 tablet by mouth daily.    [provider]  omega-3 acid ethyl esters (LOVAZA) 1 g capsule Take 1 g by mouth daily.    [provider]  Omega-3 Fatty Acids (FISH OIL) 1000 MG CAPS Take 1 capsule by mouth daily.    [provider]  omeprazole (PRILOSEC) 20 MG capsule Take 20 mg by mouth daily.    [provider]  OVER THE COUNTER MEDICATION Heartburn/"ulcer" relief.  Take as directed.    [provider]  Probiotic Product (PROBIOTIC PO) Take 1 tablet by mouth 2 (two) times daily.    [provider]  simvastatin (ZOCOR) 20 MG tablet TAKE 1 TABLET BY MOUTH AT BEDTIME 04/21/16   [provider]    Allergies    Lisinopril  Review of Systems   Review of Systems  Constitutional: Positive for appetite change. Negative for chills and fever.  HENT: Negative for congestion.   Respiratory: Negative for shortness of breath.   Cardiovascular: Negative for chest pain.  Gastrointestinal: Positive for nausea.  Musculoskeletal: Negative for back pain.  Skin: Negative for rash.  Neurological: Positive for headaches.  Hematological: Negative for adenopathy.  Psychiatric/Behavioral: Negative for confusion.    Physical Exam Updated Vital Signs BP (!) 184/71   Pulse 61   Temp 99 F (37.2 C) (Oral)   Resp 16   Ht 5' (1.524 m)   Wt 61.2 kg   SpO2 96%   BMI 26.37 kg/m   Physical Exam Vitals reviewed.    Constitutional:      Appearance: She is well-developed.  HENT:     Head: Atraumatic.  Eyes:     Pupils: Pupils are equal, round, and reactive to light.  Cardiovascular:     Rate and Rhythm: Regular rhythm.  Pulmonary:     Breath sounds: No wheezing, rhonchi or rales.  Abdominal:     Tenderness: There is no abdominal tenderness.  Musculoskeletal:     Cervical back: Neck supple.  Neurological:     Mental Status: She is alert. Mental status is at baseline.  Psychiatric:        Behavior: Behavior normal.     ED Results / Procedures / Treatments   Labs (all labs ordered are listed, but only abnormal results are displayed) Labs Reviewed  COMPREHENSIVE METABOLIC PANEL - Abnormal; Notable for the following components:      Result Value   Glucose, Bld 119 (*)    Total Protein 6.4 (*)    All other components within normal limits  URINALYSIS, ROUTINE W REFLEX MICROSCOPIC - Abnormal; Notable for the following components:   Ketones, ur 20 (*)    Leukocytes,Ua MODERATE (*)    Bacteria, UA FEW (*)    All other components within normal limits  RESPIRATORY PANEL BY RT PCR (FLU A&B, COVID)  LIPASE, BLOOD  CBC    EKG None  Radiology CT Head Wo Contrast  Result Date: 03/08/2019 CLINICAL DATA:  69 year old female with history of headache. EXAM: CT HEAD WITHOUT CONTRAST TECHNIQUE: Contiguous axial images were obtained from the base of the skull through the vertex without intravenous contrast. COMPARISON:  Head CT 03/02/2017. FINDINGS: Brain: There is a small amount of extra-axial high attenuation in multiple sulci bilaterally, compatible with subarachnoid hemorrhage. This is best appreciated in the right parietal region on axial images 21-24 of series 3 and in the left frontal region on axial image 26 of series 3. No evidence of acute infarction, hydrocephalus, extra-axial collection or mass lesion/mass effect. Vascular: No hyperdense vessel or unexpected calcification. Skull: Old healed  left occipital skull fracture. Negative for fracture or focal lesion. Sinuses/Orbits: No acute finding. Other: None. IMPRESSION: 1. Small amount of subarachnoid hemorrhage bilaterally, as above. This is typically seen in the setting of trauma. In the absence of trauma, correlation for signs and symptoms of underlying coagulopathy would be suggested. Additional differential considerations would include cortical vein thrombosis, reversible cerebral vasoconstriction syndrome, dural venous malformation. Correlation with head CTA is suggested. Critical Value/emergent results were called by telephone at the time of interpretation on 03/08/2019 at 11:11 am to provider [facility], who verbally acknowledged these results. Electronically Signed   By: Vinnie Langton M.D.   On: 03/08/2019 11:19    Procedures Procedures (including critical care time)  Medications  Ordered in ED Medications  iohexol (OMNIPAQUE) 350 MG/ML injection 100 mL (has no administration in time range)  sodium chloride flush (NS) 0.9 % injection 3 mL (3 mLs Intravenous Given 03/08/19 1042)  promethazine (PHENERGAN) injection 12.5 mg (12.5 mg Intravenous Given 03/08/19 1041)    ED Course  I have reviewed the triage vital signs and the nursing notes.  Pertinent labs & imaging results that were available during my care of the patient were reviewed by me and considered in my medical decision making (see chart for details).    MDM Rules/Calculators/A&P                      Patient presents with a headache.  No trauma.  Has subarachnoid hemorrhage bilaterally on noncontrast head CT.  Discussed with radiology.  Recommended CTA.  CTA done.  Discussed with Dr. Marcello Moores from neurosurgery.  Recommended adding on CT venogram, however CT had already been done and could not have the venogram.  He recommends admission to neurology.  Will discuss with neurology.  CRITICAL CARE Performed by: Davonna Belling Total critical care time: 30  minutes Critical care time was exclusive of separately billable procedures and treating other patients. Critical care was necessary to treat or prevent imminent or life-threatening deterioration. Critical care was time spent personally by me on the following activities: development of treatment plan with patient and/or surrogate as well as nursing, discussions with consultants, evaluation of patient's response to treatment, examination of patient, obtaining history from patient or surrogate, ordering and performing treatments and interventions, ordering and review of laboratory studies, ordering and review of radiographic studies, pulse oximetry and re-evaluation of patient's condition.  Final Clinical Impression(s) / ED Diagnoses Final diagnoses:  Subarachnoid hemorrhage [facility])    Rx / DC Orders ED Discharge Orders    None       Davonna Belling, MD 03/08/19 1222

## 2019-03-08 NOTE — H&P (Addendum)
NEURO HOSPITALIST  H&P   Requesting Physician: Dr. Alvino Chapel      Chief Complaint: SAH  History obtained from:  Chart review/ family   HPI:                                                                                                                                         Shelly Farrell is an 69 y.o. female  With PMH HTN, HLD, neuromuscular disorder presented to [facility] ED  With c/o HA, vomiting, chills and body ache. Neurology consulted for [facility].   Patient presented to the hospital c/o HA on the top of her head that was severe. Endorses vomiting. Denies fevers, diarrhea. Denies any falls.   ED course:  BP: 184/71  Given hydralazine and cleviprex started to keep SBP < 140, Afebrile, BG; 119 CTH: small amount SAH CTA: Negative for aneurysm/AVM CTV: negative CTV UA: + leukocytes and ketones NIHSS: 1 for aphasia   Intracerebral Hemorrhage (ICH) Score Total:  0  Past Medical History:  Diagnosis Date  . Anxiety   . Arthritis   . Depression   . GERD (gastroesophageal reflux disease)   . Hyperlipidemia   . Hypertension   . Neuromuscular disorder [facility])     Past Surgical History:  Procedure Laterality Date  . ABDOMINAL HYSTERECTOMY    . TUBAL LIGATION      Family History  Problem Relation Age of Onset  . Cancer Father   . Hepatitis C Daughter          Social History:  reports that she has quit smoking. She has never used smokeless tobacco. She reports that she does not drink alcohol or use drugs.  Allergies:  Allergies  Allergen Reactions  . Lisinopril Swelling    Angioedema    Medications:                                                                                                                           Current Facility-Administered Medications  Medication Dose Route Frequency Provider Last Rate Last Admin  .  stroke: mapping our early stages of recovery  book   Does not apply Once Vonzella Nipple, NP      .  acetaminophen (TYLENOL) tablet 650 mg  650 mg Oral Q4H PRN Vonzella Nipple, NP       Or  . acetaminophen (TYLENOL) 160 MG/5ML solution 650 mg  650 mg Per Tube Q4H PRN Vonzella Nipple, NP       Or  . acetaminophen (TYLENOL) suppository 650 mg  650 mg Rectal Q4H PRN Vonzella Nipple, NP      . butalbital-acetaminophen-caffeine (FIORICET) 50-325-40 MG per tablet 1 tablet  1 tablet Oral Q6H PRN Vonzella Nipple, NP      . clevidipine (CLEVIPREX) infusion 0.5 mg/mL  0-21 mg/hr Intravenous Continuous Davonna Belling, MD 2 mL/hr at 03/08/19 1332 1 mg/hr at 03/08/19 1332  . iohexol (OMNIPAQUE) 350 MG/ML injection 50 mL  50 mL Intravenous Once PRN Davonna Belling, MD      . pantoprazole (PROTONIX) injection 40 mg  40 mg Intravenous QHS Vonzella Nipple, NP      . senna-docusate (Senokot-S) tablet 1 tablet  1 tablet Oral BID Vonzella Nipple, NP       Current Outpatient Medications  Medication Sig Dispense Refill  . amLODipine (NORVASC) 5 MG tablet Take 1 tablet (5 mg total) by mouth daily. 30 tablet 11  . ascorbic Acid (VITAMIN C) 500 MG CPCR Take 1 capsule by mouth daily.    . cholecalciferol (VITAMIN D) 1000 units tablet Take 1,000 Units by mouth daily.    Marland Kitchen donepezil (ARICEPT) 5 MG tablet Take 5 mg by mouth at bedtime.    . DULoxetine (CYMBALTA) 60 MG capsule Take 60 mg by mouth daily.     . hydrochlorothiazide (HYDRODIURIL) 25 MG tablet Take 1 tablet by mouth once daily    . losartan (COZAAR) 50 MG tablet Take 50 mg by mouth daily.    . meclizine (ANTIVERT) 25 MG tablet Take 25 mg by mouth 2 (two) times daily as needed for dizziness.    . meloxicam (MOBIC) 7.5 MG tablet Take 7.5 mg by mouth daily.    . Multiple Vitamin (MULTIVITAMIN WITH MINERALS) TABS tablet Take 1 tablet by mouth daily.    Marland Kitchen omeprazole (PRILOSEC) 20 MG capsule Take 20 mg by mouth daily.    . Probiotic Product (PROBIOTIC PO) Take 1 tablet by mouth 2 (two) times daily.    . propranolol (INDERAL) 10 MG  tablet Take 10 mg by mouth 2 (two) times daily.    . simvastatin (ZOCOR) 20 MG tablet Take 20 mg by mouth at bedtime.        ROS:                                                                                                                                       History obtained from chart review     General Examination:  Blood pressure (!) 184/71, pulse 61, temperature 99 F (37.2 C), temperature source Oral, resp. rate 16, height 5' (1.524 m), weight 61.2 kg, SpO2 96 %.  Physical Exam  Constitutional: Appears well-developed and well-nourished.  Psych: Affect appropriate to situation Eyes: Normal external eye and conjunctiva. HENT: Normocephalic, no lesions, without obvious abnormality.   Musculoskeletal-no joint tenderness, deformity or swelling Cardiovascular: Normal rate and regular rhythm.  Respiratory: Effort normal, non-labored breathing saturations WNL GI: Soft.  No distension. There is no tenderness.  Skin: WDI  Neurological Examination Mental Status: Alert, oriented, thought content appropriate.  Mild aphasia noted. Difficulty with naming and trouble with commands intermittently.. repetition/ comprehension impaired Cranial Nerves: II: Visual fields grossly normal,  III,IV, VI: ptosis not present, extra-ocular motions intact bilaterally, pupils equal, round, reactive to light and accommodation V,VII: smile symmetric, facial light touch sensation normal bilaterally VIII: hearing normal bilaterally IX,X: uvula rises midline XI: bilateral shoulder shrug XII: midline tongue extension Motor: Right : Upper extremity   5/5  Left:     Upper extremity   5/5  Lower extremity   5/5   Lower extremity   5/5 Tone and bulk:normal tone throughout; no atrophy noted Sensory:  light touch intact throughout, bilaterally Plantars: Right: downgoing   Left: downgoing Cerebellar: No  gross ataxia noted Gait: deferred   Lab Results: Basic Metabolic Panel: Recent Labs  Lab 03/08/19 0729  NA 139  K 3.6  CL 103  CO2 23  GLUCOSE 119*  BUN 12  CREATININE 0.77  CALCIUM 9.1    CBC: Recent Labs  Lab 03/08/19 0729  WBC 9.7  HGB 13.8  HCT 42.0  MCV 91.3  PLT 294    Lipid Panel: No results for input(s): CHOL, TRIG, HDL, CHOLHDL, VLDL, LDLCALC in the last 168 hours.  CBG: No results for input(s): GLUCAP in the last 168 hours.  Imaging: CT ANGIO HEAD W OR WO CONTRAST  Result Date: 03/08/2019 CLINICAL DATA:  Headache with subarachnoid hemorrhage. EXAM: CT ANGIOGRAPHY HEAD CT VENOGRAM HEAD TECHNIQUE: Multidetector CT imaging of the head was performed using the standard protocol during bolus administration of intravenous contrast. Multiplanar CT image reconstructions and MIPs were obtained to evaluate the vascular anatomy. CONTRAST:  157mL OMNIPAQUE IOHEXOL 350 MG/ML SOLN COMPARISON:  None. FINDINGS: CTA HEAD Anterior circulation: The internal carotid arteries are patent from skull base to carotid termini with mild calcified atherosclerosis which is greater on the right not and does not result in significant stenosis. ACAs and MCAs are patent without evidence of proximal branch occlusion or significant proximal stenosis. No aneurysm or vascular malformation is identified. Posterior circulation: The visualized distal vertebral arteries are widely patent to the basilar with the left being slightly dominant. Patent right PICA, left AICA, and bilateral SCAs are visualized. The basilar artery is patent with slight irregularity but no stenosis. There is a fetal origin of the right PCA. Both PCAs are patent without evidence of significant stenosis. No aneurysm or vascular malformation is identified. Anatomic variants: Fetal right PCA. Delayed phase: Bilateral subarachnoid hemorrhage as seen on earlier noncontrast head CT, greatest in the right parietal lobe. No abnormal brain  parenchymal enhancement. CTV HEAD The superior sagittal sinus, internal cerebral veins, vein of Galen, straight sinus, transverse sinuses, and sigmoid sinuses are patent without evidence of thrombus. The left transverse and sigmoid sinuses are hypoplastic. Cortical veins are grossly symmetric. IMPRESSION: 1. Mild intracranial atherosclerosis without medium or large vessel occlusion, significant stenosis, aneurysm, or vascular malformation. 2. Negative CT venogram.  Electronically Signed   By: Logan Bores M.D.   On: 03/08/2019 13:28   CT Head Wo Contrast  Result Date: 03/08/2019 CLINICAL DATA:  69 year old female with history of headache. EXAM: CT HEAD WITHOUT CONTRAST TECHNIQUE: Contiguous axial images were obtained from the base of the skull through the vertex without intravenous contrast. COMPARISON:  Head CT 03/02/2017. FINDINGS: Brain: There is a small amount of extra-axial high attenuation in multiple sulci bilaterally, compatible with subarachnoid hemorrhage. This is best appreciated in the right parietal region on axial images 21-24 of series 3 and in the left frontal region on axial image 26 of series 3. No evidence of acute infarction, hydrocephalus, extra-axial collection or mass lesion/mass effect. Vascular: No hyperdense vessel or unexpected calcification. Skull: Old healed left occipital skull fracture. Negative for fracture or focal lesion. Sinuses/Orbits: No acute finding. Other: None. IMPRESSION: 1. Small amount of subarachnoid hemorrhage bilaterally, as above. This is typically seen in the setting of trauma. In the absence of trauma, correlation for signs and symptoms of underlying coagulopathy would be suggested. Additional differential considerations would include cortical vein thrombosis, reversible cerebral vasoconstriction syndrome, dural venous malformation. Correlation with head CTA is suggested. Critical Value/emergent results were called by telephone at the time of interpretation on  03/08/2019 at 11:11 am to provider [facility], who verbally acknowledged these results. Electronically Signed   By: Vinnie Langton M.D.   On: 03/08/2019 11:19   CT VENOGRAM HEAD  Result Date: 03/08/2019 CLINICAL DATA:  Headache with subarachnoid hemorrhage. EXAM: CT ANGIOGRAPHY HEAD CT VENOGRAM HEAD TECHNIQUE: Multidetector CT imaging of the head was performed using the standard protocol during bolus administration of intravenous contrast. Multiplanar CT image reconstructions and MIPs were obtained to evaluate the vascular anatomy. CONTRAST:  181mL OMNIPAQUE IOHEXOL 350 MG/ML SOLN COMPARISON:  None. FINDINGS: CTA HEAD Anterior circulation: The internal carotid arteries are patent from skull base to carotid termini with mild calcified atherosclerosis which is greater on the right not and does not result in significant stenosis. ACAs and MCAs are patent without evidence of proximal branch occlusion or significant proximal stenosis. No aneurysm or vascular malformation is identified. Posterior circulation: The visualized distal vertebral arteries are widely patent to the basilar with the left being slightly dominant. Patent right PICA, left AICA, and bilateral SCAs are visualized. The basilar artery is patent with slight irregularity but no stenosis. There is a fetal origin of the right PCA. Both PCAs are patent without evidence of significant stenosis. No aneurysm or vascular malformation is identified. Anatomic variants: Fetal right PCA. Delayed phase: Bilateral subarachnoid hemorrhage as seen on earlier noncontrast head CT, greatest in the right parietal lobe. No abnormal brain parenchymal enhancement. CTV HEAD The superior sagittal sinus, internal cerebral veins, vein of Galen, straight sinus, transverse sinuses, and sigmoid sinuses are patent without evidence of thrombus. The left transverse and sigmoid sinuses are hypoplastic. Cortical veins are grossly symmetric. IMPRESSION: 1. Mild intracranial  atherosclerosis without medium or large vessel occlusion, significant stenosis, aneurysm, or vascular malformation. 2. Negative CT venogram. Electronically Signed   By: Logan Bores M.D.   On: 03/08/2019 13:28       Laurey Morale, MSN, NP-C [facility] [phone]  03/08/2019, 1:42 PM   Attending physician note to follow with Assessment and plan .   Assessment: 69 y.o. female  PMH HTN, HLD, neuromuscular disorder presented to [facility] ED for c/o HA. CTH  Showed bilateral small SAH. Patient denies falling or any head trauma. Cleviprex was started to maintain  SBP < 140. On exam patient having mild aphasia.  CTA was negative for aneurysm, AVM, or RCVS.  CT venogram negative for sinus venous thrombosis. Will obtain ECHO, MRI and blood cultures to evaluate for cause of SAH.     SAH: -- BP goal : SBP < 140 -- cleviprex drip to keep SBP < 140  --MRI Brain  --Echocardiogram -- High intensity Statin if LDL > 70 -- HgbA1c, fasting lipid panel -- PT consult, OT consult, Speech consult --Telemetry monitoring --Frequent neuro checks --Stroke swallow screen  -- blood cultures  Headache: Fioricet q6hrs PRN for HA  UTI: Urine culture Start antibiotics    Hyperlipidemia; hold statin in setting of hemorrhage Hold SSRI for depression    --please page stroke NP  Or  PA  Or MD from 8am -4 pm  as this patient from this time will be  followed by the stroke.   You can look them up on [email or web address]  Password [email or web address]    NEUROHOSPITALIST ADDENDUM Performed a face to face diagnostic evaluation.   I have reviewed the contents of history and physical exam as documented by PA/ARNP/Resident and agree with above documentation.  I have discussed and formulated the above plan as documented. Edits to the note have been made as needed.   69 year old female with past medical history of hypertension, hyperlipidemia, prior history of vascular skull fracture in the left occiput and tiny  cerebellar contusion presents with severe headache, nausea and vomiting.  Started sometime yesterday however patient is aphasic and having trouble describing onset of symptoms.  Patient denies history of fall/head trauma.   CT head showed bilateral small convexity subarachnoid hemorrhages= Neurosurgery was consulted, recommended CT angiogram and CT venogram.  Reviewed CT angiogram-no aneurysm/AVM-no evidence of vasoconstriction.  CT venogram also negative.  On examination, patient does not have any cranial nerve deficits.  She has mild expressive greater than receptive aphasia with difficulty naming repetition.  Motor strength 5 x 5 in all 4 extremities.  Reflexes normal plantars down going.  Coordination and sensory exam is normal.  Admitted to neurology for suspected nonaneurysmal subarachnoid hemorrhage.  Appreciate neurosurgery recommendations.  Will be admitted in ICU for blood pressure control.  Fioricet/Tylenol as needed for headache.  Will avoid NSAIDs. MRI also obtained-shows no obvious amyloid angiopathy.   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  CTV does not show evidence of sinus venous thrombosis.   Impression -Acute bilateral subarachnoid hemorrhage of unclear etiology -Hypertension   Plan -Admit to neuro ICU -BP less than [id number] systolic blood pressure -Close monitoring with frequent neurochecks -No aspirin/antiplatelet -PT/APTT : not ordered, will order  -Hold SSRIs/statins for now -Transcranial Doppler  Inform neurology MD if there is any neurological change.  Karena Addison Odelia Graciano MD [facility] [id number]   If 7pm to 7am, please call on call as listed on AMION.    This patient is neurologically critically ill due to bilateral subarachnoid hemorrhage.  She is at risk for significant risk of neurological worsening from cerebral edema,  death from brain herniation, heart  failure, , infection, respiratory failure and seizure. This patient's care requires constant monitoring of vital signs, hemodynamics, respiratory and cardiac monitoring, review of multiple databases, neurological assessment, discussion with family, other specialists and medical decision making of high complexity.  I spent 55  minutes of neurocritical time in the care of this patient.

## 2019-03-08 NOTE — ED Notes (Signed)
Pt's daughter Anderson Malta would like to be notified with updates [phone]

## 2019-03-08 NOTE — ED Triage Notes (Signed)
Pt reports generalized body aches, headache, vomiting x1, chills, runny nose that started yesterday. No fevers, abd pain or diarrhea. Pt is HOH. Unknown of any covid contacts.

## 2019-03-08 NOTE — ED Notes (Signed)
Report was called while pt was getting her MRI, this RN switched the cleviprex drip back over from MRI's IV pumps. MRI will be transporting pt up to her assigned bed now.

## 2019-03-08 NOTE — Progress Notes (Signed)
Patient requested to get up and use the restroom, RN explained to patient why she was on bedrest and that she would need to use the bed pan. Patient complied but became very upset and screamed and complained at RN about how uneccessary it was to be on bedrest. RN notified neurology, neurology explained to patient on phone why she needed to be on bedrest and patient verbalized understanding. Will continue to monitor.

## 2019-03-09 ENCOUNTER — Inpatient Hospital Stay (HOSPITAL_COMMUNITY): Payer: Medicare Other

## 2019-03-09 DIAGNOSIS — I609 Nontraumatic subarachnoid hemorrhage, unspecified: Principal | ICD-10-CM

## 2019-03-09 LAB — RAPID URINE DRUG SCREEN, HOSP PERFORMED
Amphetamines: NOT DETECTED
Barbiturates: POSITIVE — AB
Benzodiazepines: NOT DETECTED
Cocaine: NOT DETECTED
Opiates: NOT DETECTED
Tetrahydrocannabinol: POSITIVE — AB

## 2019-03-09 MED ORDER — VITAMIN D 25 MCG (1000 UNIT) PO TABS
1000.0000 [IU] | ORAL_TABLET | Freq: Every day | ORAL | Status: DC
  Administered 2019-03-09 – 2019-03-11 (×3): 1000 [IU] via ORAL
  Filled 2019-03-09 (×3): qty 1

## 2019-03-09 MED ORDER — MECLIZINE HCL 12.5 MG PO TABS
25.0000 mg | ORAL_TABLET | Freq: Two times a day (BID) | ORAL | Status: DC | PRN
  Administered 2019-03-10: 25 mg via ORAL
  Filled 2019-03-09: qty 1
  Filled 2019-03-09: qty 2

## 2019-03-09 MED ORDER — LOSARTAN POTASSIUM 50 MG PO TABS
50.0000 mg | ORAL_TABLET | Freq: Every day | ORAL | Status: DC
  Administered 2019-03-09 – 2019-03-11 (×3): 50 mg via ORAL
  Filled 2019-03-09 (×3): qty 1

## 2019-03-09 MED ORDER — DONEPEZIL HCL 5 MG PO TABS
5.0000 mg | ORAL_TABLET | Freq: Every day | ORAL | Status: DC
  Administered 2019-03-09 – 2019-03-10 (×2): 5 mg via ORAL
  Filled 2019-03-09 (×2): qty 1

## 2019-03-09 MED ORDER — AMLODIPINE BESYLATE 5 MG PO TABS
5.0000 mg | ORAL_TABLET | Freq: Every day | ORAL | Status: DC
  Administered 2019-03-09 – 2019-03-11 (×3): 5 mg via ORAL
  Filled 2019-03-09 (×3): qty 1

## 2019-03-09 MED ORDER — ASCORBIC ACID 500 MG PO TABS
500.0000 mg | ORAL_TABLET | Freq: Every day | ORAL | Status: DC
  Administered 2019-03-09 – 2019-03-11 (×3): 500 mg via ORAL
  Filled 2019-03-09 (×3): qty 1
  Filled 2019-03-09: qty 2

## 2019-03-09 MED ORDER — DULOXETINE HCL 60 MG PO CPEP
60.0000 mg | ORAL_CAPSULE | Freq: Every day | ORAL | Status: DC
  Administered 2019-03-09 – 2019-03-11 (×3): 60 mg via ORAL
  Filled 2019-03-09 (×3): qty 1

## 2019-03-09 MED ORDER — HYDROCHLOROTHIAZIDE 25 MG PO TABS
25.0000 mg | ORAL_TABLET | Freq: Every day | ORAL | Status: DC
  Administered 2019-03-09 – 2019-03-11 (×3): 25 mg via ORAL
  Filled 2019-03-09 (×3): qty 1

## 2019-03-09 MED ORDER — PANTOPRAZOLE SODIUM 40 MG PO TBEC
40.0000 mg | DELAYED_RELEASE_TABLET | Freq: Every day | ORAL | Status: DC
  Administered 2019-03-09 – 2019-03-11 (×3): 40 mg via ORAL
  Filled 2019-03-09 (×3): qty 1

## 2019-03-09 MED ORDER — ADULT MULTIVITAMIN W/MINERALS CH
1.0000 | ORAL_TABLET | Freq: Every day | ORAL | Status: DC
  Administered 2019-03-09 – 2019-03-11 (×3): 1 via ORAL
  Filled 2019-03-09 (×3): qty 1

## 2019-03-09 NOTE — Progress Notes (Signed)
EEG complete - results pending 

## 2019-03-09 NOTE — Progress Notes (Signed)
STROKE TEAM PROGRESS NOTE   HISTORY OF PRESENT ILLNESS (per record) Shelly Farrell is an 69 y.o. female  With PMH HTN, HLD, neuromuscular disorder presented to [facility] ED  With c/o HA, vomiting, chills and body ache. Neurology consulted for [facility]  Patient presented to the hospital c/o HA on the top of her head that was severe. Endorses vomiting. Denies fevers, diarrhea. Denies any falls.  ED course:  BP: 184/71  Given hydralazine and cleviprex started to keep SBP < 140, Afebrile, BG; 119 CTH: small amount SAH CTA: Negative for aneurysm/AVM CTV: negative CTV UA: + leukocytes and ketones NIHSS: 1 for aphasia Intracerebral Hemorrhage (ICH) Score Total:  0  INTERVAL HISTORY I have reviewed history of presenting illness in detail with the patient, electronic medical records and imaging films in PACS.  She is sitting up in bed.  She states headache is improved.  She continues to have aphasia and expressive language difficulties.  Blood pressure adequately controlled.  MRI scan shows bilateral convexity subarachnoid hemorrhage without any parenchymal changes.  No changes of PRES or any microhemorrhages on gradient echo sequences.  She however has a history of memory loss and is on Aricept    OBJECTIVE Vitals:   03/09/19 0600 03/09/19 0615 03/09/19 0630 03/09/19 0700  BP: (!) 117/49 (!) 122/44 (!) 108/43 125/74  Pulse: 62 64 64 65  Resp: 14 16 14 18   Temp:      TempSrc:      SpO2: 95% 95% 96% 95%  Weight:      Height:        CBC:  Recent Labs  Lab 03/08/19 0729  WBC 9.7  HGB 13.8  HCT 42.0  MCV 91.3  PLT [id number]    Basic Metabolic Panel:  Recent Labs  Lab 03/08/19 0729  NA 139  K 3.6  CL 103  CO2 23  GLUCOSE 119*  BUN 12  CREATININE 0.77  CALCIUM 9.1    Lipid Panel: No results found for: CHOL, TRIG, HDL, CHOLHDL, VLDL, LDLCALC HgbA1c: No results found for: HGBA1C Urine Drug Screen:     Component Value Date/Time   LABOPIA NONE DETECTED 03/09/2019 0517   COCAINSCRNUR NONE  DETECTED 03/09/2019 0517   LABBENZ NONE DETECTED 03/09/2019 0517   AMPHETMU NONE DETECTED 03/09/2019 0517   THCU POSITIVE (A) 03/09/2019 0517   LABBARB POSITIVE (A) 03/09/2019 0517    Alcohol Level No results found for: [facility]  IMAGING  CT ANGIO HEAD W OR WO CONTRAST 03/08/2019 IMPRESSION:  1. Mild intracranial atherosclerosis without medium or large vessel occlusion, significant stenosis, aneurysm, or vascular malformation.  2. Negative CT venogram.   CT Head Wo Contrast 03/08/2019 IMPRESSION:  1. Small amount of subarachnoid hemorrhage bilaterally, as above. This is typically seen in the setting of trauma. In the absence of trauma, correlation for signs and symptoms of underlying coagulopathy would be suggested. Additional differential considerations would include cortical vein thrombosis, reversible cerebral vasoconstriction syndrome, dural venous malformation. Correlation with head CTA is suggested.   CT Head Wo Contrast - pending 03/09/2019  MR BRAIN WO CONTRAST 03/08/2019 IMPRESSION:  1. 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  2. Mild chronic small vessel ischemia.   CT VENOGRAM HEAD 03/08/2019 IMPRESSION:  1. Mild intracranial atherosclerosis without medium or large vessel occlusion, significant stenosis, aneurysm, or vascular malformation.  2. Negative CT venogram.   Transthoracic Echocardiogram  00/00/2021 Pending  Bilateral Carotid Dopplers - not ordered  ECG - not ordered  EEG - pending   PHYSICAL EXAM Blood pressure 125/74, pulse 65, temperature 98.5 F (36.9 C), temperature source Axillary, resp. rate 18, height 5' (1.524 m), weight 61.2 kg, SpO2 95 %. Pleasant middle aged caucasian lady not in distress.  . Afebrile. Head is nontraumatic. Neck is supple without bruit.    Cardiac exam no murmur or gallop. Lungs are clear to auscultation. Distal pulses are well felt. Neurological Exam ;  Awake  Alert oriented  x 2.  Moderate expressive aphasia with clear word finding difficulties and paraphasic errors nonfluent speech.  Comprehension seems quite well preserved.  Difficulty with naming and repetition also present.  Eye movements full without nystagmus.fundi were not visualized. Vision acuity and fields appear normal. Hearing is normal. Palatal movements are normal. Face symmetric. Tongue midline. Normal strength, tone, reflexes and coordination. Normal sensation. Gait deferred.     ASSESSMENT/PLAN Ms. Shelly Farrell is a 69 y.o. female with history of HTN, HLD, neuromuscular disorder presented to [facility] ED with c/o HA, vomiting, chills and body ache. Neurology consulted for [facility] - BP: 184/71.  She did not receive IV t-PA due to [place].  SAH - etiology unclear - possibly htn versus amyloid angiopathy given history of dementia  Resultant expressive aphasia code Stroke CT Head - not ordered  CT head - Small amount of subarachnoid hemorrhage bilaterally. This is typically seen in the setting of trauma.  CT Head repeat - 03/09/2019 - pending  CT Venogram Head - negative  MRI head - Sulcal subarachnoid hemorrhage in the bilateral frontoparietal convexities, similar to prior CT. Mild chronic small vessel ischemia.   MRA head - not ordered  CTA Head -  Mild intracranial atherosclerosis without medium or large vessel occlusion, significant stenosis, aneurysm, or vascular malformation.   CT Perfusion - not ordered  Carotid Doppler - not ordered  2D Echo - pending  EEG - pending  [employer] Virus 2 - negative  LDL - not ordered  HgbA1c - not indicated  UDS - positive for THCU and LABBARB  VTE prophylaxis - SCDs Diet  Diet Order            Diet heart healthy/carb modified Room service appropriate? Yes with Assist; Fluid consistency: Thin  Diet effective now               No antithrombotic prior to admission, now on No antithrombotic  Ongoing aggressive stroke risk factor  management  Therapy recommendations:  pending  Disposition:  Pending  Hypertension  Home BP meds: Norvasc ; HCTZ ; Cozaar ; Inderal  Current BP meds: Cleviprex  Stable . SBP goal < 140 mm Hg initially . 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  Recommend strict blood pressure control with systolic goal 0000000 for 24 hours and then below 160.  Close neurological monitoring.  Repeat CT scan of the head today.  Appreciate neurosurgery help.  I spoke to the patient's husband over the phone and gave him an update about the patient's condition.  He informs me that patient has had memory loss and speech and word finding difficulties for the last to 3 years and has been started on Aricept. This patient is critically ill and at significant risk of neurological worsening, death and care requires constant monitoring of vital signs, hemodynamics,respiratory and cardiac monitoring, extensive review of multiple databases, frequent neurological assessment, discussion with family, other specialists and medical decision making of high complexity.I have made any additions or clarifications directly to the above note.This critical care time does not reflect procedure time, or teaching time or supervisory time of PA/NP/Med Resident etc but  could involve care discussion time.  I spent 30 minutes of neurocritical care time  in the care of  this patient.    Antony Contras, MD To contact Stroke Continuity provider, please refer to [email or web address] After hours, contact General Neurology

## 2019-03-09 NOTE — Procedures (Signed)
Patient Name: Shelly Farrell  MRN: [id number]  Epilepsy Attending: Lora Havens  Referring Physician/Provider: Dr Antony Contras Date: 03/09/2019 Duration: 2  Patient history: 69yo F who presented with headache, T head showed sub arachnoid hemorrhage. EEG to evaluate for seizure.   Level of alertness: awake  AEDs during EEG study: None  Technical aspects: This EEG study was done with scalp electrodes positioned according to the 10-20 International system of electrode placement. Electrical activity was acquired at a sampling rate of 500Hz  and reviewed with a high frequency filter of 70Hz  and a low frequency filter of 1Hz . EEG data were recorded continuously and digitally stored.   DESCRIPTION: The posterior dominant rhythm consists of 10-11 Hz activity of moderate voltage (25-35 uV) seen predominantly in posterior head regions, symmetric and reactive to eye opening and eye closing.        There is continuous let frontotemporal 5-6hz  theta slowing as well as 15 to 18 Hz, 2-3 uV beta activity with irregular morphology distributed symmetrically and diffusely.  Hyperventilation and photic stimulation were not performed.  ABNORMALITY - Continuous slow, generalized   IMPRESSION: This study is suggestive of cortical dysfunction in left frontotemporal region likely secondary to underlying structural abnormality. No seizures or epileptiform discharges were seen throughout the recording.  Haizley Cannella Barbra Sarks

## 2019-03-09 NOTE — Progress Notes (Signed)
Subjective: Patient reports headache improved  Objective: Vital signs in last 24 hours: Temp:  [98.4 F (36.9 C)-98.6 F (37 C)] 98.5 F (36.9 C) (03/06 0400) Pulse Rate:  [58-114] 63 (03/06 0800) Resp:  [12-31] 14 (03/06 0800) BP: (95-205)/(40-102) 138/75 (03/06 0800) SpO2:  [92 %-100 %] 96 % (03/06 0800)  Intake/Output from previous day: 03/05 0701 - 03/06 0700 In: 53.8 [I.V.:53.8] Out: 1500 [Urine:1500] Intake/Output this shift: Total I/O In: 1.6 [I.V.:1.6] Out: -  A+Ox3 FC x 4 No drift  Lab Results: Recent Labs    03/08/19 0729  WBC 9.7  HGB 13.8  HCT 42.0  PLT 294   BMET Recent Labs    03/08/19 0729  NA 139  K 3.6  CL 103  CO2 23  GLUCOSE 119*  BUN 12  CREATININE 0.77  CALCIUM 9.1    Studies/Results: CT ANGIO HEAD W OR WO CONTRAST  Result Date: 03/08/2019 CLINICAL DATA:  Headache with subarachnoid hemorrhage. EXAM: CT ANGIOGRAPHY HEAD CT VENOGRAM HEAD TECHNIQUE: Multidetector CT imaging of the head was performed using the standard protocol during bolus administration of intravenous contrast. Multiplanar CT image reconstructions and MIPs were obtained to evaluate the vascular anatomy. CONTRAST:  150mL OMNIPAQUE IOHEXOL 350 MG/ML SOLN COMPARISON:  None. FINDINGS: CTA HEAD Anterior circulation: The internal carotid arteries are patent from skull base to carotid termini with mild calcified atherosclerosis which is greater on the right not and does not result in significant stenosis. ACAs and MCAs are patent without evidence of proximal branch occlusion or significant proximal stenosis. No aneurysm or vascular malformation is identified. Posterior circulation: The visualized distal vertebral arteries are widely patent to the basilar with the left being slightly dominant. Patent right PICA, left AICA, and bilateral SCAs are visualized. The basilar artery is patent with slight irregularity but no stenosis. There is a fetal origin of the right PCA. Both PCAs are patent  without evidence of significant stenosis. No aneurysm or vascular malformation is identified. Anatomic variants: Fetal right PCA. Delayed phase: Bilateral subarachnoid hemorrhage as seen on earlier noncontrast head CT, greatest in the right parietal lobe. No abnormal brain parenchymal enhancement. CTV HEAD The superior sagittal sinus, internal cerebral veins, vein of Galen, straight sinus, transverse sinuses, and sigmoid sinuses are patent without evidence of thrombus. The left transverse and sigmoid sinuses are hypoplastic. Cortical veins are grossly symmetric. IMPRESSION: 1. Mild intracranial atherosclerosis without medium or large vessel occlusion, significant stenosis, aneurysm, or vascular malformation. 2. Negative CT venogram. Electronically Signed   By: Logan Bores M.D.   On: 03/08/2019 13:28   CT Head Wo Contrast  Result Date: 03/08/2019 CLINICAL DATA:  69 year old female with history of headache. EXAM: CT HEAD WITHOUT CONTRAST TECHNIQUE: Contiguous axial images were obtained from the base of the skull through the vertex without intravenous contrast. COMPARISON:  Head CT 03/02/2017. FINDINGS: Brain: There is a small amount of extra-axial high attenuation in multiple sulci bilaterally, compatible with subarachnoid hemorrhage. This is best appreciated in the right parietal region on axial images 21-24 of series 3 and in the left frontal region on axial image 26 of series 3. No evidence of acute infarction, hydrocephalus, extra-axial collection or mass lesion/mass effect. Vascular: No hyperdense vessel or unexpected calcification. Skull: Old healed left occipital skull fracture. Negative for fracture or focal lesion. Sinuses/Orbits: No acute finding. Other: None. IMPRESSION: 1. Small amount of subarachnoid hemorrhage bilaterally, as above. This is typically seen in the setting of trauma. In the absence of trauma, correlation for signs  and symptoms of underlying coagulopathy would be suggested. Additional  differential considerations would include cortical vein thrombosis, reversible cerebral vasoconstriction syndrome, dural venous malformation. Correlation with head CTA is suggested. Critical Value/emergent results were called by telephone at the time of interpretation on 03/08/2019 at 11:11 am to provider [facility], who verbally acknowledged these results. Electronically Signed   By: Vinnie Langton M.D.   On: 03/08/2019 11:19   MR BRAIN WO CONTRAST  Result Date: 03/08/2019 CLINICAL DATA:  Stroke follow-up. EXAM: MRI HEAD WITHOUT CONTRAST TECHNIQUE: Multiplanar, multiecho pulse sequences of the brain and surrounding structures were obtained without intravenous contrast. COMPARISON:  CT/CT angiogram March 08, 2019 FINDINGS: The study is degraded by motion. Brain: No acute infarction, hydrocephalus or mass lesion. Again seen is cortical subarachnoid hemorrhage in the bilateral frontoparietal regions with distribution similar to prior CT. Scattered foci of T2 hyperintensity are seen within the white matter of the cerebral hemispheres, nonspecific, most likely related to chronic small vessel ischemia. Vascular: Normal flow voids. Skull and upper cervical spine: Normal marrow signal. Sinuses/Orbits: Mild mucosal thickening of the ethmoid cells. The orbits are grossly. Other: None. IMPRESSION: 1. 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 2. Mild chronic small vessel ischemia. Electronically Signed   By: Pedro Earls M.D.   On: 03/08/2019 15:53   CT VENOGRAM HEAD  Result Date: 03/08/2019 CLINICAL DATA:  Headache with subarachnoid hemorrhage. EXAM: CT ANGIOGRAPHY HEAD CT VENOGRAM HEAD TECHNIQUE: Multidetector CT imaging of the head was performed using the standard protocol during bolus administration of intravenous contrast. Multiplanar CT image reconstructions and MIPs were obtained to evaluate the vascular anatomy. CONTRAST:  110mL  OMNIPAQUE IOHEXOL 350 MG/ML SOLN COMPARISON:  None. FINDINGS: CTA HEAD Anterior circulation: The internal carotid arteries are patent from skull base to carotid termini with mild calcified atherosclerosis which is greater on the right not and does not result in significant stenosis. ACAs and MCAs are patent without evidence of proximal branch occlusion or significant proximal stenosis. No aneurysm or vascular malformation is identified. Posterior circulation: The visualized distal vertebral arteries are widely patent to the basilar with the left being slightly dominant. Patent right PICA, left AICA, and bilateral SCAs are visualized. The basilar artery is patent with slight irregularity but no stenosis. There is a fetal origin of the right PCA. Both PCAs are patent without evidence of significant stenosis. No aneurysm or vascular malformation is identified. Anatomic variants: Fetal right PCA. Delayed phase: Bilateral subarachnoid hemorrhage as seen on earlier noncontrast head CT, greatest in the right parietal lobe. No abnormal brain parenchymal enhancement. CTV HEAD The superior sagittal sinus, internal cerebral veins, vein of Galen, straight sinus, transverse sinuses, and sigmoid sinuses are patent without evidence of thrombus. The left transverse and sigmoid sinuses are hypoplastic. Cortical veins are grossly symmetric. IMPRESSION: 1. Mild intracranial atherosclerosis without medium or large vessel occlusion, significant stenosis, aneurysm, or vascular malformation. 2. Negative CT venogram. Electronically Signed   By: Logan Bores M.D.   On: 03/08/2019 13:28    Assessment/Plan: 69 yo F with sulcal SAH, no structural cause identified - recommend repeat CT this morning; if stable, can be downgraded and pharmacologic DVT prophylaxis can be started.  Vallarie Mare 03/09/2019, 10:22 AM

## 2019-03-09 NOTE — Progress Notes (Signed)
OT Cancellation Note  Patient Details Name: Shelly Farrell MRN: [id number] DOB: 09/13/1950   Cancelled Treatment:    Reason Eval/Treat Not Completed: Medical issues which prohibited therapy- elevated BP with activity during PT.  Will defer OT eval this date.  [name]., OTR/L Acute Rehabilitation Services Pager [phone] Office [phone]   [name] M 03/09/2019, 3:20 PM

## 2019-03-09 NOTE — Evaluation (Signed)
Physical Therapy Evaluation Patient Details Name: Shelly Farrell MRN: [id number] DOB: [birth year] Today's Date: 03/09/2019   History of Present Illness  69 y.o. female  With PMH HTN, HLD, neuromuscular disorder, memory loss on Aricept presented to [facility] ED  With c/o HA, vomiting,aphasia, chills and body ache. MRI scan shows bilateral convexity subarachnoid hemorrhage without any parenchymal changes. Repeat head CT 03/09/19 unchanged.   Clinical Impression   Pt admitted with above diagnosis. Mobility assessment limited due to elevated BP with supine to sit to stand. RN in to adjust BP meds, however remained elevated and pt returned to supine. Patient with limitations in mobility due to h/o imbalance (denies "furniture walking" however reports feeling unsteady) and generalized weakness. Pt currently with functional limitations due to the deficits listed below (see PT Problem List). Pt will benefit from skilled PT to increase their independence and safety with mobility to allow discharge to the venue listed below.       Follow Up Recommendations Outpatient PT;Supervision/Assistance - 24 hour(with anticipated/normal progression; may need CIR/SNF)    Equipment Recommendations  None recommended by PT    Recommendations for Other Services OT consult;Speech consult     Precautions / Restrictions Precautions Precautions: Fall;Other (comment) Precaution Comments: SBP <140      Mobility  Bed Mobility Overal bed mobility: Needs Assistance Bed Mobility: Supine to Sit;Sit to Supine     Supine to sit: Supervision Sit to supine: Supervision   General bed mobility comments: on ICU air bed; supervision for safety  Transfers Overall transfer level: Needs assistance Equipment used: 1 person hand held assist Transfers: Sit to/from Stand;Stand Pivot Transfers Sit to Stand: Min guard Stand pivot transfers: Min guard       General transfer comment: states she feels weak and shakey from lying  around  Ambulation/Gait             General Gait Details: unable to assess due to elevated BP with transfer to [place]            Wheelchair Mobility    Modified Rankin (Stroke Patients Only) Modified Rankin (Stroke Patients Only) Pre-Morbid Rankin Score: Slight disability Modified Rankin: Moderately severe disability     Balance Overall balance assessment: Mild deficits observed, not formally tested(unable to fully assess due to elevated BP)                                           Pertinent Vitals/Pain Pain Assessment: 0-10 Pain Score: 6  Pain Location: head Pain Descriptors / Indicators: Headache Pain Intervention(s): Limited activity within patient's tolerance;Monitored during session;Repositioned    Home Living Family/patient expects to be discharged to:: Private residence Living Arrangements: Spouse/significant other;Children(daughter) Available Help at Discharge: Family;Available 24 hours/day(dtr works 3-9 pm; husband comes/goes; pt alone 2-5 hr/day) Type of Home: House Home Access: Stairs to enter Entrance Stairs-Rails: Psychiatric nurse of Steps: 4 Home Layout: One level Home Equipment: None Additional Comments: information per daughter, Anderson Malta    Prior Function Level of Independence: Needs assistance   Gait / Transfers Assistance Needed: independent, however has fallen on steps previously so daughter likes for pt to hold onto someone, plus railing  ADL's / Homemaking Assistance Needed: very seldom drives; daughter sets up pill box  Comments: information per daughter Anderson Malta     Hand Dominance   Dominant Hand: Right    Extremity/Trunk Assessment   Upper  Extremity Assessment Upper Extremity Assessment: Defer to OT evaluation    Lower Extremity Assessment Lower Extremity Assessment: Generalized weakness    Cervical / Trunk Assessment Cervical / Trunk Assessment: Normal  Communication    Communication: HOH;Expressive difficulties  Cognition Arousal/Alertness: Awake/alert Behavior During Therapy: WFL for tasks assessed/performed Overall Cognitive Status: Impaired/Different from baseline Area of Impairment: Orientation;Attention;Memory                 Orientation Level: Time(could not state her DOB) Current Attention Level: Sustained Memory: Decreased recall of precautions;Decreased short-term memory         General Comments: states she was having memory issues before, however she is worse now than before      General Comments General comments (skin integrity, edema, etc.): see flowsheet for BPs with changes in position    Exercises     Assessment/Plan    PT Assessment Patient needs continued PT services  PT Problem List Decreased strength;Decreased activity tolerance;Decreased balance;Decreased mobility;Decreased cognition;Decreased knowledge of use of DME;Decreased knowledge of precautions;Cardiopulmonary status limiting activity       PT Treatment Interventions DME instruction;Gait training;Stair training;Functional mobility training;Therapeutic activities;Balance training;Neuromuscular re-education;Cognitive remediation;Patient/family education    PT Goals (Current goals can be found in the Care Plan section)  Acute Rehab PT Goals Patient Stated Goal: go home soon PT Goal Formulation: With patient Time For Goal Achievement: 03/23/19 Potential to Achieve Goals: Good    Frequency Min 4X/week   Barriers to discharge Decreased caregiver support per daughter, pt is alone for 2-5 hrs/day    Co-evaluation               AM-PAC PT "6 Clicks" Mobility  Outcome Measure Help needed turning from your back to your side while in a flat bed without using bedrails?: None Help needed moving from lying on your back to sitting on the side of a flat bed without using bedrails?: None Help needed moving to and from a bed to a chair (including a wheelchair)?:  A Little Help needed standing up from a chair using your arms (e.g., wheelchair or bedside chair)?: A Little Help needed to walk in hospital room?: Total Help needed climbing 3-5 steps with a railing? : Total 6 Click Score: 16    End of Session   Activity Tolerance: Treatment limited secondary to medical complications (Comment) Patient left: in bed;with call bell/phone within reach Nurse Communication: Mobility status;Other (comment)(elevated BPs) PT Visit Diagnosis: Other symptoms and signs involving the nervous system [id number])    Time: [id number] PT Time Calculation (min) (ACUTE ONLY): 34 min   Charges:   PT Evaluation $PT Eval High Complexity: 1 High PT Treatments $Therapeutic Activity: 8-22 mins         Arby Barrette, PT Pager [phone]   Rexanne Mano 03/09/2019, 3:16 PM

## 2019-03-10 ENCOUNTER — Inpatient Hospital Stay (HOSPITAL_COMMUNITY): Payer: Medicare Other

## 2019-03-10 DIAGNOSIS — I6389 Other cerebral infarction: Secondary | ICD-10-CM

## 2019-03-10 NOTE — Evaluation (Signed)
Speech Language Pathology Evaluation Patient Details Name: Shelly Farrell MRN: [id number] DOB: 1950-09-03 Today's Date: 03/10/2019 Time: [id number] SLP Time Calculation (min) (ACUTE ONLY): 22 min  Problem List:  Patient Active Problem List   Diagnosis Date Noted  . SAH (subarachnoid hemorrhage) ([place]) 03/08/2019  . ACE inhibitor-aggravated angioedema, initial encounter 07/26/2018  . ACE inhibitor-aggravated angioedema 07/26/2018  . Depression   . Hypertension   . GERD (gastroesophageal reflux disease)   . Hyperlipidemia   . Skull fracture with cerebral contusion, closed, initial encounter ([place]) 03/02/2017   Past Medical History:  Past Medical History:  Diagnosis Date  . Anxiety   . Arthritis   . Depression   . GERD (gastroesophageal reflux disease)   . Hyperlipidemia   . Hypertension   . Neuromuscular disorder [facility])    Past Surgical History:  Past Surgical History:  Procedure Laterality Date  . ABDOMINAL HYSTERECTOMY    . TUBAL LIGATION     HPI:  Pt is a 69 y.o. female with PMH HTN, HLD, neuromuscular disorder presented to [facility] ED  with c/o HA, vomiting, chills and body ache.  MRI of the brain revealed sulcal subarachnoid hemorrhage in the bilateral frontoparietal convexities. Neurology consulted for [facility]. Neurosurgery recommended that patient f/u in neurosurgery clinic in 4 weeks. EEG: Cortical dysfunction in left frontotemporal region likely secondary to underlying structural abnormality.   Assessment / Plan / Recommendation Clinical Impression  Pt reported that she is currently retired and endorsed having some difficulty with memory and word retrieval at baseline. She inconsistently stated that these deficits are currently worse. Pt presents with mild non-fluent aphasia characterized by impairments in receptive and expressive language with more notable difficulty with verbal expression. She was able to follow 2-step commands and answer simple yes/no questions but exhibited  increased difficulty beyond this level. She was able to participate in conversation but word retrieval difficulty was frequently demonstrated. Sentence repetition was challenging and additional processing time was needed during naming tasks. No motor speech deficits were noted during the evaluation and a formal cognition-linguistic assessment was deferred due to pt's language deficits. She demonstrated some difficulty in the areas of awareness, attention, memory, and orientation to time. However, the impact of her hearing and language impairments on her performance is considered. Skilled SLP services are clinically indicated at this time to improve aphasia.     SLP Assessment  SLP Recommendation/Assessment: Patient needs continued Speech Lanaguage Pathology Services SLP Visit Diagnosis: Aphasia (R47.01)    Follow Up Recommendations  Outpatient SLP    Frequency and Duration min 2x/week  2 weeks      SLP Evaluation Cognition  Overall Cognitive Status: Difficult to assess Arousal/Alertness: Awake/alert Orientation Level: Oriented to person;Oriented to place;Disoriented to time;Oriented to situation Attention: Focused;Sustained Focused Attention: Appears intact Sustained Attention: Impaired Sustained Attention Impairment: Verbal complex Memory: Impaired Memory Impairment: Storage deficit;Retrieval deficit;Decreased short term memory Decreased Short Term Memory: Verbal complex Awareness: Impaired Awareness Impairment: Emergent impairment       Comprehension  Auditory Comprehension Overall Auditory Comprehension: Impaired Yes/No Questions: Impaired Basic Immediate Environment Questions: (5/5) Complex Questions: (2/5) Paragraph Comprehension (via yes/no questions): (4/4) Two Step Basic Commands: (4/4) Multistep Basic Commands: (0/4) Conversation: Simple    Expression Expression Primary Mode of Expression: Verbal Verbal Expression Overall Verbal Expression: Impaired Initiation:  Impaired Automatic Speech: Counting;Day of week;Month of year(Additional processing time needed) Level of Generative/Spontaneous Verbalization: Conversation Repetition: Impaired Level of Impairment: Sentence level(2/5) Naming: Impairment Responsive: (4/5) Confrontation: (10/10 with repeated attempts and additional time. )  Convergent: (5/5) Divergent: Not tested Pragmatics: No impairment Interfering Components: Attention   Oral / Motor  Motor Speech Overall Motor Speech: Appears within functional limits for tasks assessed Respiration: Within functional limits Phonation: Normal Resonance: Within functional limits Articulation: Within functional limitis Intelligibility: Intelligible Motor Planning: Witnin functional limits Motor Speech Errors: Not applicable   Noelene Gang I. Hardin Negus, [place], [place] Office number [phone] Pager [place] 03/10/2019, 10:07 AM

## 2019-03-10 NOTE — Progress Notes (Signed)
*  PRELIMINARY RESULTS* Echocardiogram 2D Echocardiogram with bubble study has been performed.  Leavy Cella 03/10/2019, 9:14 AM

## 2019-03-10 NOTE — Progress Notes (Signed)
  Speech Language Pathology Treatment: Cognitive-Linquistic  Patient Details Name: Shelly Farrell MRN: [id number] DOB: Feb 24, 1950 Today's Date: 03/10/2019 Time: [id number] SLP Time Calculation (min) (ACUTE ONLY): 20 min  Assessment / Plan / Recommendation Clinical Impression  Pt was seen for aphasia treatment and was cooperative throughout the session. She required repetition of questions possibly due to her impaired hearing combined with auditory comprehension deficits. She achieved 90% accuracy with confrontational naming increasing to 100% with min cues. She demonstrated 60% accuracy with sentence completion increasing to 100% with mod phonemic and orthographic cues. She completed a picture description task with 60% accuracy increasing to 100% accuracy with min-mod verbal cues. She provided 5-8 items per concrete category within a minute during divergent naming tasks. SLP will continue to follow pt.    HPI HPI: Pt is a 69 y.o. female with PMH HTN, HLD, neuromuscular disorder presented to [facility] ED  with c/o HA, vomiting, chills and body ache.  MRI of the brain revealed sulcal subarachnoid hemorrhage in the bilateral frontoparietal convexities. Neurology consulted for [facility]. Neurosurgery recommended that patient f/u in neurosurgery clinic in 4 weeks. EEG: Cortical dysfunction in left frontotemporal region likely secondary to underlying structural abnormality.      SLP Plan  Continue with current plan of care  Patient needs continued Speech Lanaguage Pathology Services    Recommendations                   Follow up Recommendations: Outpatient SLP SLP Visit Diagnosis: Aphasia (R47.01) Plan: Continue with current plan of care       Shelly Farrell I. Hardin Negus, [place], [place] Office number [phone] Pager [place] 03/10/2019, 10:16 AM

## 2019-03-10 NOTE — Progress Notes (Signed)
Neurosurgery  Repeat CT head without contrast reviewed.  Improving small SAH.  Patient can f/u in neurosurgery clinic in 4 weeks.

## 2019-03-10 NOTE — Progress Notes (Signed)
STROKE TEAM PROGRESS NOTE     INTERVAL HISTORY   She is sitting up in bed.  She states headache is improved.  She continues to have aphasia and expressive language difficulties.  Blood pressure adequately controlled even off Cleviprex drip now.  She continues to have expressive language difficulties but can understand almost everything.  Vital signs stable.  No changes.  EEG shows focal left hemispheric slowing but no definite epileptiform activity.  Follow-up CT scan of the head yesterday showed improving subarachnoid hemorrhage and no new or worrisome findings    OBJECTIVE Vitals:   03/10/19 0515 03/10/19 0530 03/10/19 0545 03/10/19 0600  BP: 119/73 130/68 131/72 127/68  Pulse: 66 68 72 69  Resp: 17 15 13 16   Temp:      TempSrc:      SpO2: 95% 96% 96% 96%  Weight:      Height:        CBC:  Recent Labs  Lab 03/08/19 0729  WBC 9.7  HGB 13.8  HCT 42.0  MCV 91.3  PLT [id number]    Basic Metabolic Panel:  Recent Labs  Lab 03/08/19 0729  NA 139  K 3.6  CL 103  CO2 23  GLUCOSE 119*  BUN 12  CREATININE 0.77  CALCIUM 9.1    Lipid Panel: No results found for: CHOL, TRIG, HDL, CHOLHDL, VLDL, LDLCALC HgbA1c: No results found for: HGBA1C Urine Drug Screen:     Component Value Date/Time   LABOPIA NONE DETECTED 03/09/2019 0517   COCAINSCRNUR NONE DETECTED 03/09/2019 0517   LABBENZ NONE DETECTED 03/09/2019 0517   AMPHETMU NONE DETECTED 03/09/2019 0517   THCU POSITIVE (A) 03/09/2019 0517   LABBARB POSITIVE (A) 03/09/2019 0517    Alcohol Level No results found for: [facility]  IMAGING  CT ANGIO HEAD W OR WO CONTRAST 03/08/2019 IMPRESSION:  1. Mild intracranial atherosclerosis without medium or large vessel occlusion, significant stenosis, aneurysm, or vascular malformation.  2. Negative CT venogram.   CT Head Wo Contrast 03/08/2019 IMPRESSION:  1. Small amount of subarachnoid hemorrhage bilaterally, as above. This is typically seen in the setting of trauma. In the absence of  trauma, correlation for signs and symptoms of underlying coagulopathy would be suggested. Additional differential considerations would include cortical vein thrombosis, reversible cerebral vasoconstriction syndrome, dural venous malformation. Correlation with head CTA is suggested.   CT Head Wo Contrast  03/09/2019 IMPRESSION: Decreased volume of sulcal subarachnoid hemorrhage reflecting interval redistribution. No new hemorrhage. No hydrocephalus.  MR BRAIN WO CONTRAST 03/08/2019 IMPRESSION:  1. 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  2. Mild chronic small vessel ischemia.   CT VENOGRAM HEAD 03/08/2019 IMPRESSION:  1. Mild intracranial atherosclerosis without medium or large vessel occlusion, significant stenosis, aneurysm, or vascular malformation.  2. Negative CT venogram.   Transthoracic Echocardiogram  00/00/2021 Pending  Bilateral Carotid Dopplers - not ordered  ECG - not ordered - will order  EEG 03/09/2019 ABNORMALITY - Continuous slow, generalized  IMPRESSION: This study is suggestive of cortical dysfunction in left frontotemporal region likely secondary to underlying structural abnormality. No seizures or epileptiform discharges were seen throughout the recording.   PHYSICAL EXAM Blood pressure 127/68, pulse 69, temperature 98.3 F (36.8 C), temperature source Oral, resp. rate 16, height 5' (1.524 m), weight 61.2 kg, SpO2 96 %. Pleasant middle aged caucasian lady not in distress.  . Afebrile. Head is nontraumatic. Neck is supple without bruit.    Cardiac exam no murmur or gallop. Lungs  are clear to auscultation. Distal pulses are well felt. Neurological Exam ;  Awake  Alert oriented x 2.  Moderate expressive aphasia with clear word finding difficulties and paraphasic errors nonfluent speech.  Comprehension seems quite well preserved.  Difficulty with naming and repetition also present.  Eye movements full without  nystagmus.fundi were not visualized. Vision acuity and fields appear normal. Hearing is normal. Palatal movements are normal. Face symmetric. Tongue midline. Normal strength, tone, reflexes and coordination. Normal sensation. Gait deferred.     ASSESSMENT/PLAN Ms. Jennavecia Kravetz is a 69 y.o. female with history of HTN, HLD, neuromuscular disorder presented to [facility] ED with c/o HA, vomiting, chills and body ache. Neurology consulted for [facility] - BP: 184/71.  She did not receive IV t-PA due to [place].  SAH - etiology unclear - possibly htn versus amyloid angiopathy given history of dementia  Resultant expressive aphasia code Stroke CT Head - not ordered  CT head - Small amount of subarachnoid hemorrhage bilaterally. This is typically seen in the setting of trauma.  CT Head repeat - 03/09/2019 - Decreased volume of sulcal subarachnoid hemorrhage reflecting interval redistribution. No new hemorrhage. No hydrocephalus.   CT Venogram Head - negative  MRI head - Sulcal subarachnoid hemorrhage in the bilateral frontoparietal convexities, similar to prior CT. Mild chronic small vessel ischemia.   MRA head - not ordered  CTA Head -  Mild intracranial atherosclerosis without medium or large vessel occlusion, significant stenosis, aneurysm, or vascular malformation.   CT Perfusion - not ordered  Carotid Doppler - not ordered  2D Echo - pending  EEG - This study is suggestive of cortical dysfunction in left frontotemporal region likely secondary to underlying structural abnormality. No seizures or epileptiform discharges were seen throughout the recording.  [employer] Virus 2 - negative  LDL - not ordered  HgbA1c - not indicated  UDS - positive for THCU and LABBARB  VTE prophylaxis - SCDs Diet  Diet Order            Diet heart healthy/carb modified Room service appropriate? Yes with Assist; Fluid consistency: Thin  Diet effective now              No antithrombotic prior to admission, now  on No antithrombotic  Ongoing aggressive stroke risk factor management  Therapy recommendations:  Outpt PT recommended  Disposition:  Pending  Hypertension  Home BP meds: Norvasc ; HCTZ ; Cozaar ; Inderal  Current BP meds: Cleviprex  Stable . SBP goal < 140 mm Hg initially . Long-term BP goal normotensive  Hyperlipidemia  Home Lipid lowering medication: Zocor 20 mg daily  LDL - not ordered, goal < 70  Current lipid lowering medication: None (statin contraindicated with ICH)  Continue statin at discharge  Other Stroke Risk Factors  Advanced age  Former cigarette smoker - quit  Other Active Problems  Code status - Full Code  NS consult - Vallarie Mare, MD -> repeat CT Head - OK for pharmacologic DVT prophylaxis    Hospital day # 2  She presented with headache and speech difficulties secondary to bilateral convexity aneurysm negative subarachnoid hemorrhage etiology indeterminate possibly hypertension versus amyloid angiopathy given history of cognitive impairment.  Recommend strict blood pressure control with systolic goal below 0000000.  Mobilize out of bed.  Therapy consults.  Transfer to neurology floor bed.  Hopefully discharge home in the next few days if remains stable.  Discussed with patient and husband and answered questions.  Greater than 50% time  during this 35-minute visit was spent in counseling and coordination of care about the stroke and discussion with care team and answering questions Antony Contras, MD    To contact Stroke Continuity provider, please refer to [email or web address] After hours, contact General Neurology

## 2019-03-11 ENCOUNTER — Inpatient Hospital Stay (HOSPITAL_COMMUNITY): Payer: Medicare Other

## 2019-03-11 DIAGNOSIS — E876 Hypokalemia: Secondary | ICD-10-CM | POA: Diagnosis present

## 2019-03-11 DIAGNOSIS — E785 Hyperlipidemia, unspecified: Secondary | ICD-10-CM

## 2019-03-11 DIAGNOSIS — I10 Essential (primary) hypertension: Secondary | ICD-10-CM

## 2019-03-11 DIAGNOSIS — R4701 Aphasia: Secondary | ICD-10-CM | POA: Diagnosis present

## 2019-03-11 DIAGNOSIS — I680 Cerebral amyloid angiopathy: Secondary | ICD-10-CM

## 2019-03-11 DIAGNOSIS — E854 Organ-limited amyloidosis: Secondary | ICD-10-CM | POA: Diagnosis present

## 2019-03-11 DIAGNOSIS — R4189 Other symptoms and signs involving cognitive functions and awareness: Secondary | ICD-10-CM | POA: Diagnosis present

## 2019-03-11 LAB — CBC
HCT: 41.6 % (ref 36.0–46.0)
Hemoglobin: 13.8 g/dL (ref 12.0–15.0)
MCH: 30.4 pg (ref 26.0–34.0)
MCHC: 33.2 g/dL (ref 30.0–36.0)
MCV: 91.6 fL (ref 80.0–100.0)
Platelets: 272 10*3/uL (ref 150–400)
RBC: 4.54 MIL/uL (ref 3.87–5.11)
RDW: 13.4 % (ref 11.5–15.5)
WBC: 9.4 10*3/uL (ref 4.0–10.5)
nRBC: 0 % (ref 0.0–0.2)

## 2019-03-11 LAB — BASIC METABOLIC PANEL
Anion gap: 10 (ref 5–15)
BUN: 16 mg/dL (ref 8–23)
CO2: 26 mmol/L (ref 22–32)
Calcium: 9.2 mg/dL (ref 8.9–10.3)
Chloride: 102 mmol/L (ref 98–111)
Creatinine, Ser: 0.88 mg/dL (ref 0.44–1.00)
GFR calc Af Amer: >60 mL/min (ref >60)
GFR calc non Af Amer: >60 mL/min (ref >60)
Glucose, Bld: 107 mg/dL — ABNORMAL HIGH (ref 70–99)
Potassium: 3.2 mmol/L — ABNORMAL LOW (ref 3.5–5.1)
Sodium: 138 mmol/L (ref 135–145)

## 2019-03-11 LAB — LIPID PANEL
Cholesterol: 225 mg/dL — ABNORMAL HIGH (ref 0–200)
HDL: 42 mg/dL (ref >40)
LDL Cholesterol: 158 mg/dL — ABNORMAL HIGH (ref 0–99)
Total CHOL/HDL Ratio: 5.4 RATIO
Triglycerides: 127 mg/dL (ref <150)
VLDL: 25 mg/dL (ref 0–40)

## 2019-03-11 LAB — HEMOGLOBIN A1C
Hgb A1c MFr Bld: 5.5 % (ref 4.8–5.6)
Mean Plasma Glucose: 111.15 mg/dL

## 2019-03-11 MED ORDER — BUTALBITAL-APAP-CAFFEINE 50-325-40 MG PO TABS: 1.0000 | ORAL_TABLET | Freq: Four times a day (QID) | ORAL | 0 refills | Status: AC | PRN

## 2019-03-11 MED ORDER — POTASSIUM CHLORIDE CRYS ER 20 MEQ PO TBCR
40.0000 meq | EXTENDED_RELEASE_TABLET | ORAL | Status: AC
  Administered 2019-03-11 (×2): 40 meq via ORAL
  Filled 2019-03-11 (×2): qty 2

## 2019-03-11 MED ORDER — SIMVASTATIN 40 MG PO TABS: 40.0000 mg | ORAL_TABLET | Freq: Every day | ORAL | 2 refills | Status: DC

## 2019-03-11 NOTE — TOC Transition Note (Signed)
Transition of Care [facility]) - CM/SW Discharge Note   Patient Details  Name: Shelly Farrell MRN: [id number] Date of Birth: [birth year]  [facility] CM/SW Contact:  Pollie Friar, RN Phone Number: 03/11/2019, 3:50 PM   Clinical Narrative:    Pt discharging home with outpatient therapy. CM met with the patient and she is in agreement with attending [facility]. Orders in Epic and information on the AVS.  Pt states she has transportation to outpatient therapy.  Pt has transportation home today.    Final next level of care: OP Rehab Barriers to Discharge: No Barriers Identified   Patient Goals and CMS Choice     Choice offered to / list presented to : Patient  Discharge Placement                       Discharge Plan and Services                                     Social Determinants of Health (SDOH) Interventions     Readmission Risk Interventions No flowsheet data found.

## 2019-03-11 NOTE — Progress Notes (Signed)
Discharged in w/c to daughter's vehicle after IV access removed and discharge instructions given to pt and daughter.  Daughter also went over all instructions with doctor as well.

## 2019-03-11 NOTE — Evaluation (Signed)
Occupational Therapy Evaluation and Discharge Patient Details Name: Shelly Farrell MRN: [id number] DOB: 16-Apr-1950 Today's Date: 03/11/2019    History of Present Illness 69 y.o. female  With PMH HTN, HLD, neuromuscular disorder, memory loss on Aricept presented to [facility] ED  With c/o HA, vomiting,aphasia, chills and body ache. MRI scan shows bilateral convexity subarachnoid hemorrhage without any parenchymal changes. Repeat head CT 03/09/19 unchanged.    Clinical Impression   This 69 yo female admitted with above presents to acute OT at a Mod I to independent level with basic ADLs. Did not pt with some cognitive issues with getting her thoughts and words out as well as memory (pt with issues pta per chart and patient) and HOH. Pt has support of dtr and husband at home with her, no further OT needs identified, we will sign off.  Supine BP 130/80   HR 72 Sitting BP 124/77  HR 75     Follow Up Recommendations  No OT follow up;Supervision - Intermittent    Equipment Recommendations  None recommended by OT       Precautions / Restrictions Precautions Precautions: None Precaution Comments: SBP <140 Restrictions Weight Bearing Restrictions: No      Mobility Bed Mobility Overal bed mobility: Modified Independent Bed Mobility: Supine to Sit     Supine to sit: Modified independent (Device/Increase time);HOB elevated        Transfers Overall transfer level: Modified independent Equipment used: None Transfers: Sit to/from Stand   Stand pivot transfers: Modified independent (Device/Increase time)       General transfer comment: increased time upon initial standing but then once she got going she was moving at a normal pace    Balance Overall balance assessment: Mild deficits observed, not formally tested                                         ADL either performed or assessed with clinical judgement   ADL                                          General ADL Comments: Independent to Mod I with all basic ADLs in room     Vision Patient Visual Report: No change from baseline              Pertinent Vitals/Pain Pain Assessment: 0-10 Pain Score: 5  Pain Location: head Pain Descriptors / Indicators: Headache Pain Intervention(s): Limited activity within patient's tolerance;Monitored during session(pre-medicated per pt)     Hand Dominance Right   Extremity/Trunk Assessment Upper Extremity Assessment Upper Extremity Assessment: Overall WFL for tasks assessed           Communication Communication Communication: HOH;Expressive difficulties(pt reports these are not new)   Cognition Arousal/Alertness: Awake/alert Behavior During Therapy: WFL for tasks assessed/performed Overall Cognitive Status: History of cognitive impairments - at baseline                                 General Comments: states she was having memory issues before and word finding before              [place] expects to be discharged to:: Private residence Living Arrangements: Spouse/significant other;Children(daughter) Available Help at Discharge: Family;Available  24 hours/day Type of Home: House Home Access: Stairs to enter [employer] of Steps: 4 Entrance Stairs-Rails: Right;Left Home Layout: One level     Bathroom Shower/Tub: Corporate investment banker: Standard     Home Equipment: None   Additional Comments: information per daughter, Shelly Farrell      Prior Functioning/Environment Level of Independence: Needs assistance  Gait / Transfers Assistance Needed: independent, however has fallen on steps previously so daughter likes for pt to hold onto someone, plus railing ADL's / Homemaking Assistance Needed: very seldom drives; daughter sets up pill box Communication / Swallowing Assistance Needed: word-finding issues noticed Comments: information per daughter Shelly Farrell                  OT Goals(Current goals can be found in the care plan section) Acute Rehab OT Goals Patient Stated Goal: to go home and see her dog  OT Frequency:                AM-PAC OT "6 Clicks" Daily Activity     Outcome Measure Help from another person eating meals?: None Help from another person taking care of personal grooming?: None Help from another person toileting, which includes using toliet, bedpan, or urinal?: None Help from another person bathing (including washing, rinsing, drying)?: None Help from another person to put on and taking off regular upper body clothing?: None Help from another person to put on and taking off regular lower body clothing?: None 6 Click Score: 24   End of Session Equipment Utilized During Treatment: Gait belt Nurse Communication: Mobility status  Activity Tolerance: Patient tolerated treatment well Patient left: in chair;with call bell/phone within reach;with chair alarm set  OT Visit Diagnosis: Unsteadiness on feet (R26.81);Pain Pain - part of body: (headache)                Time: 0830-0900 OT Time Calculation (min): 30 min Charges:  OT General Charges $OT Visit: 1 Visit OT Evaluation $OT Eval Moderate Complexity: 1 Mod OT Treatments $Self Care/Home Management : 8-22 mins  CathyOTR/L Acute Rehab Services Pager [phone] Office [phone]     03/11/2019, 2:05 PM

## 2019-03-11 NOTE — Progress Notes (Addendum)
Physical Therapy Treatment Patient Details Name: Shelly Farrell MRN: [id number] DOB: 1950/06/07 Today's Date: 03/11/2019    History of Present Illness 69 y.o. female  With PMH HTN, HLD, neuromuscular disorder, memory loss on Aricept presented to [facility] ED  With c/o HA, vomiting,aphasia, chills and body ache. MRI scan shows bilateral convexity subarachnoid hemorrhage without any parenchymal changes. Repeat head CT 03/09/19 unchanged.     PT Comments    Pt performed gt training and functional mobility.  She is progressing well functionally and will likely perform better in her home environment.  She reports she has all necessary DME for home use.  PTA educated on need for RW use for support when daughter is not present as her balance is impaired.  Pt to follow up with out patient therapies at d/c.   Pt to d/c home today with her daughter.   Follow Up Recommendations  Outpatient PT;Supervision/Assistance - 24 hour     Equipment Recommendations  None recommended by PT    Recommendations for Other Services       Precautions / Restrictions Precautions Precautions: None Precaution Comments: SBP <140 Restrictions Weight Bearing Restrictions: No    Mobility  Bed Mobility Overal bed mobility: Modified Independent Bed Mobility: Supine to Sit     Supine to sit: Modified independent (Device/Increase time)        Transfers Overall transfer level: Needs assistance Equipment used: None Transfers: Sit to/from Stand Sit to Stand: Min guard Stand pivot transfers: Modified independent (Device/Increase time)       General transfer comment: Pt able to rise to standing with minor instability, min guard for safety.  Ambulation/Gait Ambulation/Gait assistance: Min guard Gait Distance (Feet): 200 Feet Assistive device: Rolling walker (2 wheeled) Gait Pattern/deviations: Step-through pattern;Staggering right;Ataxic     General Gait Details: Mild ataxic pattern, Cues for increased reciprocal  armswing.  Pt staggering to the R.   Stairs Stairs: Yes Stairs assistance: Min guard Stair Management: One rail Right Number of Stairs: 4 General stair comments: Cues for sequencing and hand placment on unilateral rail support.  Poor foot clearance x 1 but able to correct balance with use of rail.   Wheelchair Mobility    Modified Rankin (Stroke Patients Only) Modified Rankin (Stroke Patients Only) Pre-Morbid Rankin Score: Slight disability Modified Rankin: Moderately severe disability     Balance Overall balance assessment: Mild deficits observed, not formally tested                                          Cognition Arousal/Alertness: Awake/alert Behavior During Therapy: WFL for tasks assessed/performed Overall Cognitive Status: History of cognitive impairments - at baseline Area of Impairment: Orientation;Attention;Memory                 Orientation Level: Time Current Attention Level: Sustained Memory: Decreased recall of precautions;Decreased short-term memory         General Comments: states she was having memory issues before and word finding before      Exercises      General Comments        Pertinent Vitals/Pain Pain Assessment: 0-10 Pain Score: 4  Pain Location: head Pain Descriptors / Indicators: Headache Pain Intervention(s): Monitored during session;Repositioned    Home Living Family/patient expects to be discharged to:: Private residence Living Arrangements: Spouse/significant other;Children(daughter) Available Help at Discharge: Family;Available 24 hours/day Type of Home: House Home Access: Stairs  to enter Entrance Stairs-Rails: Right;Left Home Layout: One level Home Equipment: None Additional Comments: information per daughter, Shelly Farrell    Prior Function Level of Independence: Needs assistance  Gait / Transfers Assistance Needed: independent, however has fallen on steps previously so daughter likes for pt to  hold onto someone, plus railing ADL's / Homemaking Assistance Needed: very seldom drives; daughter sets up pill box Comments: information per daughter Shelly Farrell   PT Goals (current goals can now be found in the care plan section) Acute Rehab PT Goals Patient Stated Goal: to go home and see her dog Potential to Achieve Goals: Good Progress towards PT goals: Progressing toward goals    Frequency    Min 4X/week      PT Plan Current plan remains appropriate    Co-evaluation              AM-PAC PT "6 Clicks" Mobility   Outcome Measure  Help needed turning from your back to your side while in a flat bed without using bedrails?: None Help needed moving from lying on your back to sitting on the side of a flat bed without using bedrails?: None Help needed moving to and from a bed to a chair (including a wheelchair)?: A Little Help needed standing up from a chair using your arms (e.g., wheelchair or bedside chair)?: A Little Help needed to walk in hospital room?: A Little Help needed climbing 3-5 steps with a railing? : A Little 6 Click Score: 20    End of Session Equipment Utilized During Treatment: Gait belt Activity Tolerance: Treatment limited secondary to medical complications (Comment) Patient left: in chair; with call bell/phone within reach, chair alarm activated.  Nurse Communication: Mobility status;Other (comment)(elevated BPs.) PT Visit Diagnosis: Other symptoms and signs involving the nervous system [id number])     Time: [id number] PT Time Calculation (min) (ACUTE ONLY): 20 min  Charges:  $Gait Training: 8-22 mins                     Erasmo Leventhal , PTA Acute Rehabilitation Services Pager [phone] Office [phone]     Jarrod Bodkins Eli Hose 03/11/2019, 4:58 PM

## 2019-03-11 NOTE — Discharge Summary (Addendum)
Stroke Discharge Summary  Patient ID: Kathryne Youtz   MRN: [id number]      DOB: October 17, 1950  Date of Admission: 03/08/2019 Date of Discharge: 03/11/2019  Attending Physician:  Rosalin Hawking, MD, Stroke MD Consultant(s):     Duffy Rhody, MD (neurosurgery) Patient's PCP:  Medicine, [facility]  DISCHARGE DIAGNOSIS:  Principal Problem:   SAH (subarachnoid hemorrhage) ([place]) d/t ?? amyloid angiopathy Active Problems:   Hypertension   Hyperlipidemia   ?? Cerebral amyloid angiopathy (HCC)   Cognitive decline   Hypokalemia   Allergies as of 03/11/2019      Reactions   Lisinopril Swelling   Angioedema      Medication List    STOP taking these medications   meloxicam 7.5 MG tablet Commonly known as: MOBIC     TAKE these medications   amLODipine 5 MG tablet Commonly known as: NORVASC Take 1 tablet (5 mg total) by mouth daily.   ascorbic Acid 500 MG Cpcr Commonly known as: VITAMIN C Take 1 capsule by mouth daily.   butalbital-acetaminophen-caffeine 50-325-40 MG tablet Commonly known as: FIORICET Take 1 tablet by mouth every 6 (six) hours as needed for headache.   cholecalciferol 1000 units tablet Commonly known as: VITAMIN D Take 1,000 Units by mouth daily.   donepezil 5 MG tablet Commonly known as: ARICEPT Take 5 mg by mouth at bedtime.   DULoxetine 60 MG capsule Commonly known as: CYMBALTA Take 60 mg by mouth daily.   hydrochlorothiazide 25 MG tablet Commonly known as: HYDRODIURIL Take 1 tablet by mouth once daily   losartan 50 MG tablet Commonly known as: COZAAR Take 50 mg by mouth daily.   meclizine 25 MG tablet Commonly known as: ANTIVERT Take 25 mg by mouth 2 (two) times daily as needed for dizziness.   multivitamin with minerals Tabs tablet Take 1 tablet by mouth daily.   omeprazole 20 MG capsule Commonly known as: PRILOSEC Take 20 mg by mouth daily.   PROBIOTIC PO Take 1 tablet by mouth 2 (two) times daily.   propranolol  10 MG tablet Commonly known as: INDERAL Take 10 mg by mouth 2 (two) times daily.   simvastatin 40 MG tablet Commonly known as: ZOCOR Take 1 tablet (40 mg total) by mouth at bedtime. What changed:   medication strength  how much to take       LABORATORY STUDIES CBC    Component Value Date/Time   WBC 9.4 03/11/2019 0414   RBC 4.54 03/11/2019 0414   HGB 13.8 03/11/2019 0414   HCT 41.6 03/11/2019 0414   PLT 272 03/11/2019 0414   MCV 91.6 03/11/2019 0414   MCH 30.4 03/11/2019 0414   MCHC 33.2 03/11/2019 0414   RDW 13.4 03/11/2019 0414   LYMPHSABS 3.5 07/26/2018 1506   MONOABS 0.8 07/26/2018 1506   EOSABS 0.2 07/26/2018 1506   BASOSABS 0.1 07/26/2018 1506   CMP    Component Value Date/Time   NA 138 03/11/2019 0414   K 3.2 (L) 03/11/2019 0414   CL 102 03/11/2019 0414   CO2 26 03/11/2019 0414   GLUCOSE 107 (H) 03/11/2019 0414   BUN 16 03/11/2019 0414   CREATININE 0.88 03/11/2019 0414   CALCIUM 9.2 03/11/2019 0414   PROT 6.4 (L) 03/08/2019 0729   ALBUMIN 3.8 03/08/2019 0729   AST 23 03/08/2019 0729   ALT 23 03/08/2019 0729   ALKPHOS 95 03/08/2019 0729   BILITOT 0.8 03/08/2019 0729   GFRNONAA >60 03/11/2019 0414  GFRAA >60 03/11/2019 0414   COAGS Lab Results  Component Value Date   INR 1.0 03/08/2019   INR 0.92 03/02/2017   Lipid Panel    Component Value Date/Time   CHOL 225 (H) 03/11/2019 0414   TRIG 127 03/11/2019 0414   HDL 42 03/11/2019 0414   CHOLHDL 5.4 03/11/2019 0414   VLDL 25 03/11/2019 0414   LDLCALC 158 (H) 03/11/2019 0414   HgbA1C  Lab Results  Component Value Date   HGBA1C 5.5 03/11/2019   Urinalysis    Component Value Date/Time   COLORURINE YELLOW 03/08/2019 0956   APPEARANCEUR CLEAR 03/08/2019 0956   LABSPEC 1.021 03/08/2019 0956   PHURINE 5.0 03/08/2019 0956   GLUCOSEU NEGATIVE 03/08/2019 0956   HGBUR NEGATIVE 03/08/2019 0956   BILIRUBINUR NEGATIVE 03/08/2019 0956   KETONESUR 20 (A) 03/08/2019 0956   PROTEINUR NEGATIVE  03/08/2019 0956   NITRITE NEGATIVE 03/08/2019 0956   LEUKOCYTESUR MODERATE (A) 03/08/2019 0956   Urine Drug Screen     Component Value Date/Time   LABOPIA NONE DETECTED 03/09/2019 0517   COCAINSCRNUR NONE DETECTED 03/09/2019 0517   LABBENZ NONE DETECTED 03/09/2019 0517   AMPHETMU NONE DETECTED 03/09/2019 0517   THCU POSITIVE (A) 03/09/2019 0517   LABBARB POSITIVE (A) 03/09/2019 0517    SIGNIFICANT DIAGNOSTIC STUDIES CT Head Wo Contrast 03/08/2019 1. Small amount of subarachnoid hemorrhage bilaterally, as above. This is typically seen in the setting of trauma. In the absence of trauma, correlation for signs and symptoms of underlying coagulopathy would be suggested. Additional differential considerations would include cortical vein thrombosis, reversible cerebral vasoconstriction syndrome, dural venous malformation. Correlation with head CTA is suggested.   CT ANGIO HEAD W OR WO CONTRAST 03/08/2019 1. Mild intracranial atherosclerosis without medium or large vessel occlusion, significant stenosis, aneurysm, or vascular malformation.  2. Negative CT venogram.   CT VENOGRAM HEAD 03/08/2019 1. Mild intracranial atherosclerosis without medium or large vessel occlusion, significant stenosis, aneurysm, or vascular malformation.  2. Negative CT venogram.   MR BRAIN WO CONTRAST 03/08/2019 1. 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  2. Mild chronic small vessel ischemia.   CT Head Wo Contrast  03/09/2019 Decreased volume of sulcal subarachnoid hemorrhage reflecting interval redistribution. No new hemorrhage. No hydrocephalus.  Transthoracic Echocardiogram w/ bubble 03/10/2019 1. Left ventricular ejection fraction, by estimation, is 60 to 65%. The  left ventricle has normal function. The left ventricle has no regional wall motion abnormalities. Left ventricular diastolic parameters were normal.  2. Right ventricular systolic function is  normal. The right ventricular size is normal. There is mildly elevated pulmonary artery systolic pressure.  3. The mitral valve is normal in structure and function. No evidence of mitral valve regurgitation. No evidence of mitral stenosis.  4. The aortic valve is normal in structure and function. Aortic valve regurgitation is not visualized. No aortic stenosis is present.  5. The inferior vena cava is normal in size with greater than 50% respiratory variability, suggesting right atrial pressure of 3 mmHg.  6. Agitated saline contrast bubble study was negative, with no evidence of any interatrial shunt.   EEG 03/09/2019 This study is suggestive of cortical dysfunction in left frontotemporal region likely secondary to underlying structural abnormality. No seizures or epileptiform discharges were seen throughout the recording.     HISTORY OF PRESENT ILLNESS Keydi Hunteris an 69 y.o.female with PMH HTN, HLD, neuromuscular disorder presented to [facility] ED With c/o HA, vomiting, chills and body ache. Neurology consulted  for [facility].  Patient presented to the hospital c/o HA on the top of her head that was severe. Endorses vomiting. Denies fevers, diarrhea. Denies any falls.  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 Amirrah Savarese is a 69 y.o. female with history of HTN, HLD, neuromuscular disorder presented to [facility] ED with c/o HA, vomiting, chills and body ache. Neurology consulted for [facility] - BP: 184/71.  She did not receive IV t-PA due to [place].  SAH - concerning secondary to amyloid angiopathy given history of cognitive impairment  Resultant expressive aphasia   NS consult -> no surgical intervention indicated  CT head - Small amount of subarachnoid  hemorrhage bilaterally.   CT Head 03/09/2019 - Decreased volume of sulcal subarachnoid hemorrhage reflecting interval redistribution. No new hemorrhage. No hydrocephalus.   CTA Head -  Mild intracranial atherosclerosis without medium or large vessel occlusion, significant stenosis, aneurysm, or vascular malformation.   CT Venogram Head - negative  MRI head - Sulcal subarachnoid hemorrhage in the bilateral frontoparietal convexities, similar to prior CT. Mild chronic small vessel ischemia.   2D Echo - EF 60-65%. No source of embolus    EEG - cortical dysfunction in left frontotemporal region likely secondary to underlying structural abnormality.No seizures or epileptiform discharges.  Sars Corona Virus 2 - negative  LDL - 158  HgbA1c - 5.5  UDS - positive for THC  No antithrombotic prior to admission, now on No antithrombotic given hemorrhage. No aspirin, antithrombotics life-long d/t ?? CAA with bleeding risk.  Therapy recommendations:  Outpt PT, SLP. No OT  Disposition:  return home w/ family   ?? CAA  Bilateral vertex SAH  Hx of cognitive impairment  Exam consistent with cognitive impairment  No ASA or other antiplatelet or anticoagulation  Strict BP control  Hypertension  Home BP meds: Norvasc ; HCTZ ; Cozaar ; Inderal  Treated w/ Cleviprex in the ICU w/ SBP goal <140  Current BP meds: norvasc 5,  HCTZ 25, cozaar 50  BP Stable  BP goal normotensive. Needs ongoing strict control long-term  Hyperlipidemia  Home Lipid lowering medication: Zocor 20 mg daily  LDL - 158, goal < 70  Not treated with lipid lowering medication in hospital as statin contraindicated with ICH  Will increase zocor to 40 at discharge  Other Stroke Risk Factors  Advanced age  Former cigarette smoker - quit  Other Active Problems  Code status - Full Code  Hypokalemia, replaced  HA secondary to [place], prn Fioricet    DISCHARGE EXAM Blood pressure (!) 130/59, pulse 70,  temperature 97.9 F (36.6 C), temperature source Oral, resp. rate 20, height 5' (1.524 m), weight 61.2 kg, SpO2 95 %.  Pleasant middle aged caucasian lady not in distress. Afebrile. Head is nontraumatic. Neck is supple without bruit.    Cardiac exam no murmur or gallop. Lungs are clear to auscultation. Distal pulses are well felt.  Neurological Exam ;  Awake  Alert oriented x 2.  intermittent word finding difficulties and paraphasic errors Comprehension seems quite well preserved.  Difficulty with naming and repetition also present. Registration 1-2/3 with delayed recall 0/3. Eye movements full without nystagmus.fundi were not visualized. Vision acuity and fields appear normal. Hearing is normal. Palatal movements are normal. Face symmetric. Tongue midline. Normal strength, tone,  reflexes and coordination. Normal sensation. Gait deferred.  Discharge Diet   Heart healthy/carb modified thin liquids  DISCHARGE PLAN  Disposition:  Return home w/ family  Outpatient SLP, PT  Due to hemorrhage and risk of bleeding, do not take aspirin, aspirin-containing medications, or ibuprofen products - life-long d/t bleeding risk from possible amyloid angiopathy  Aggressive BP control with normotensive BP goal given amyloid angiopathy and risk of bleeding  Ongoing stroke risk factor control by Primary Care Physician at time of discharge  Follow-up PCP Medicine, [facility] in 2 weeks.  Follow-up in [facility] in 4 weeks, office to schedule an appointment.   35 minutes were spent preparing discharge.  Rosalin Hawking, MD PhD Stroke Neurology 03/11/2019 8:18 PM

## 2019-03-13 LAB — CULTURE, BLOOD (ROUTINE X 2)
Culture: NO GROWTH
Culture: NO GROWTH
Special Requests: ADEQUATE
Special Requests: ADEQUATE

## 2019-04-10 ENCOUNTER — Inpatient Hospital Stay: Payer: No Typology Code available for payment source | Admitting: Adult Health

## 2019-04-10 NOTE — Progress Notes (Deleted)
[facility] [address] [place]. [place] [address] [phone]       Shelly Farrell Date of Birth:  1950-12-16 Medical Record Number:  [id number]   Reason for Referral:  hospital stroke follow up    CHIEF COMPLAINT:  No chief complaint on file.   HPI: Shelly Farrell being seen today for in office hospital follow-up regarding ***.  History obtained from *** and chart review. Reviewed all radiology images and labs personally.   Shelly Farrell a 69 y.o.femalewith history of HTN, HLD, neuromuscular disorderpresented on 03/08/2019 to [facility] ED with c/o HA, vomiting, chills and body ache. Evaluated by stroke team and Dr. Leonie Man with stroke work-up revealing SAH in the bilateral frontoparietal convexities with the etiology concerning for amyloid angiopathy given history of cognitive impairment.  CTA head/neck and 2D echo unremarkable.  Antithrombotics not recommended lifelong due to questionable CAA with increased bleeding risk. Hx of HTN and recommended continuation of Norvasc, hydrochlorothiazide and Cozaar and discontinued Inderal with importance of strict control of BP long-term with elevated BP on admission.  LDL 158 and recommended increasing simvastatin from 20 mg to 40 mg daily.  No history of DM.  Other stroke risk factors include former tobacco use but no prior history of stroke.  Evaluated by therapies and recommended outpatient PT/ST and discharged home with family in stable condition.  SAH - concerning secondary to amyloid angiopathy given history of cognitive impairment  Resultant expressive aphasia   NS consult ->no surgical intervention indicated  CT head-Small amount of subarachnoid hemorrhage bilaterally.   CT Head 03/09/2019 -Decreased volume of sulcal subarachnoid hemorrhage reflecting interval redistribution. No new hemorrhage. No hydrocephalus.  CTA Head - Mild intracranial atherosclerosis  without medium or large vessel occlusion, significant stenosis, aneurysm, or vascular malformation.   CT Venogram Head - negative  MRI head - Sulcal subarachnoid hemorrhage in the bilateral frontoparietal convexities, similar to prior CT. Mild chronic small vessel ischemia.   2D Echo- EF 60-65%. No source of embolus    EEG -cortical dysfunction in left frontotemporal region likely secondary to underlying structural abnormality.No seizures or epileptiform discharges.  Sars Corona Virus 2 - negative  LDL - 158  HgbA1c- 5.5  UDS -positive for THC  No antithromboticprior to admission, now on No antithrombotic given hemorrhage. No aspirin, antithrombotics life-long d/t ?? CAA with bleeding risk.  Therapy recommendations:Outpt PT, SLP. No OT  Disposition: return home w/ family         ROS:   14 system review of systems performed and negative with exception of ***  PMH:  Past Medical History:  Diagnosis Date  . Anxiety   . Arthritis   . Depression   . GERD (gastroesophageal reflux disease)   . Hyperlipidemia   . Hypertension   . Neuromuscular disorder (HCC)     PSH:  Past Surgical History:  Procedure Laterality Date  . ABDOMINAL HYSTERECTOMY    . TUBAL LIGATION      Social History:  Social History   Socioeconomic History  . Marital status: Married    Spouse name: Not on file  . Number of children: Not on file  . Years of education: Not on file  . Highest education level: Not on file  Occupational History  . Not on file  Tobacco Use  . Smoking status: Former Research scientist (life sciences)  . Smokeless tobacco: Never Used  Substance and Sexual Activity  . Alcohol use: No    Alcohol/week: 0.0 standard  drinks  . Drug use: No  . Sexual activity: Not on file  Other Topics Concern  . Not on file  Social History Narrative  . Not on file   Social Determinants of Health   Financial Resource Strain:   . Difficulty of Paying Living Expenses:   Food Insecurity:   .  Worried About Charity fundraiser in the Last Year:   . Arboriculturist in the Last Year:   Transportation Needs:   . Film/video editor (Medical):   Marland Kitchen Lack of Transportation (Non-Medical):   Physical Activity:   . Days of Exercise per Week:   . Minutes of Exercise per Session:   Stress:   . Feeling of Stress :   Social Connections:   . Frequency of Communication with Friends and Family:   . Frequency of Social Gatherings with Friends and Family:   . Attends Religious Services:   . Active Member of Clubs or Organizations:   . Attends Archivist Meetings:   Marland Kitchen Marital Status:   Intimate Partner Violence:   . Fear of Current or Ex-Partner:   . Emotionally Abused:   Marland Kitchen Physically Abused:   . Sexually Abused:     Family History:  Family History  Problem Relation Age of Onset  . Cancer Father   . Hepatitis C Daughter     Medications:   Current Outpatient Medications on File Prior to Visit  Medication Sig Dispense Refill  . amLODipine (NORVASC) 5 MG tablet Take 1 tablet (5 mg total) by mouth daily. 30 tablet 11  . ascorbic Acid (VITAMIN C) 500 MG CPCR Take 1 capsule by mouth daily.    . butalbital-acetaminophen-caffeine (FIORICET) 50-325-40 MG tablet Take 1 tablet by mouth every 6 (six) hours as needed for headache. 14 tablet 0  . cholecalciferol (VITAMIN D) 1000 units tablet Take 1,000 Units by mouth daily.    Marland Kitchen donepezil (ARICEPT) 5 MG tablet Take 5 mg by mouth at bedtime.    . DULoxetine (CYMBALTA) 60 MG capsule Take 60 mg by mouth daily.     . hydrochlorothiazide (HYDRODIURIL) 25 MG tablet Take 1 tablet by mouth once daily    . losartan (COZAAR) 50 MG tablet Take 50 mg by mouth daily.    . meclizine (ANTIVERT) 25 MG tablet Take 25 mg by mouth 2 (two) times daily as needed for dizziness.    . Multiple Vitamin (MULTIVITAMIN WITH MINERALS) TABS tablet Take 1 tablet by mouth daily.    Marland Kitchen omeprazole (PRILOSEC) 20 MG capsule Take 20 mg by mouth daily.    . Probiotic  Product (PROBIOTIC PO) Take 1 tablet by mouth 2 (two) times daily.    . propranolol (INDERAL) 10 MG tablet Take 10 mg by mouth 2 (two) times daily.    . simvastatin (ZOCOR) 40 MG tablet Take 1 tablet (40 mg total) by mouth at bedtime. 30 tablet 2   No current facility-administered medications on file prior to visit.    Allergies:   Allergies  Allergen Reactions  . Lisinopril Swelling    Angioedema     Physical Exam  There were no vitals filed for this visit. There is no height or weight on file to calculate BMI. No exam data present  Depression screen [facility] 2/9 12/29/2014  Decreased Interest 2  Down, Depressed, Hopeless 2  PHQ - 2 Score 4  Altered sleeping 0  Tired, decreased energy 2  Change in appetite 1  Feeling bad or failure  about yourself  3  Trouble concentrating 1  Moving slowly or fidgety/restless 1  Suicidal thoughts 1  PHQ-9 Score 13  Difficult doing work/chores Somewhat difficult     General: well developed, well nourished, seated, in no evident distress Head: head normocephalic and atraumatic.   Neck: supple with no carotid or supraclavicular bruits Cardiovascular: regular rate and rhythm, no murmurs Musculoskeletal: no deformity Skin:  no rash/petichiae Vascular:  Normal pulses all extremities   Neurologic Exam Mental Status: Awake and fully alert. Oriented to place and time. Recent and remote memory intact. Attention span, concentration and fund of knowledge appropriate. Mood and affect appropriate.  Cranial Nerves: Fundoscopic exam reveals sharp disc margins. Pupils equal, briskly reactive to light. Extraocular movements full without nystagmus. Visual fields full to confrontation. Hearing intact. Facial sensation intact. Face, tongue, palate moves normally and symmetrically.  Motor: Normal bulk and tone. Normal strength in all tested extremity muscles. Sensory.: intact to touch , pinprick , position and vibratory sensation.  Coordination: Rapid  alternating movements normal in all extremities. Finger-to-nose and heel-to-shin performed accurately bilaterally. Gait and Station: Arises from chair without difficulty. Stance is normal. Gait demonstrates normal stride length and balance Reflexes: 1+ and symmetric. Toes downgoing.     NIHSS  *** Modified Rankin  ***     ASSESSMENT: Shelly Farrell is a 69 y.o. year old female presented with HA, vomiting, chills and body aches on 03/08/2019 with stroke work-up revealing SAH etiology concerning for amyloid angiopathy with bilateral vertex SAH and history of cognitive impairment. Vascular risk factors include HTN and HLD.    PLAN:  1. Bilateral SAH:  -Avoidance of aspirin, aspirin-containing products and ibuprofen products lifelong due to increased bleeding risk for possible amyloid angiopathy.   -Continue simvastatin 40 mg daily for secondary stroke prevention.  -Maintain strict control of hypertension with blood pressure goal below 130/90, diabetes with hemoglobin A1c goal below 6.5% and cholesterol with LDL cholesterol (bad cholesterol) goal below 70 mg/dL.  I also advised the patient to eat a healthy diet with plenty of whole grains, cereals, fruits and vegetables, exercise regularly with at least 30 minutes of continuous activity daily and maintain ideal body weight. 2. HTN: Advised to continue current treatment regimen.  Today's BP ***.  Advised to continue to monitor at home along with continued follow-up with PCP for management 3. HLD: Advised to continue current treatment regimen along with continued follow-up with PCP for future prescribing and monitoring of lipid panel 4.     Follow up in *** or call earlier if needed   Greater than 50% of time during this 45 minute visit was spent on counseling, explanation of diagnosis of ***, reviewing risk factor management of ***, planning of further management along with potential future management, and discussion with patient and  family answering all questions.    Frann Rider, AGNP-BC  [facility] [address] [place], [place] [address]  Phone [phone] Fax [phone] Note: This document was prepared with digital dictation and possible smart phrase technology. Any transcriptional errors that result from this process are unintentional.

## 2019-04-11 ENCOUNTER — Encounter: Payer: Self-pay | Admitting: Adult Health

## 2019-04-23 ENCOUNTER — Ambulatory Visit: Payer: Medicare Other | Admitting: Speech Pathology

## 2019-04-23 ENCOUNTER — Ambulatory Visit: Payer: Medicare Other

## 2019-05-06 ENCOUNTER — Ambulatory Visit: Payer: Medicare Other

## 2019-05-06 ENCOUNTER — Ambulatory Visit: Payer: Medicare Other | Admitting: Speech Pathology

## 2019-07-03 ENCOUNTER — Encounter (HOSPITAL_COMMUNITY): Payer: Self-pay

## 2019-07-03 ENCOUNTER — Emergency Department (HOSPITAL_COMMUNITY): Payer: Medicare Other

## 2019-07-03 ENCOUNTER — Other Ambulatory Visit: Payer: Self-pay

## 2019-07-03 ENCOUNTER — Inpatient Hospital Stay (HOSPITAL_COMMUNITY)
Admission: EM | Admit: 2019-07-03 | Discharge: 2019-07-05 | DRG: 065 | Disposition: A | Discharge status: Home / Self Care | Payer: Medicare Other | Attending: Internal Medicine | Admitting: Internal Medicine

## 2019-07-03 DIAGNOSIS — F329 Major depressive disorder, single episode, unspecified: Secondary | ICD-10-CM

## 2019-07-03 DIAGNOSIS — Z20822 Contact with and (suspected) exposure to covid-19: Secondary | ICD-10-CM | POA: Diagnosis present

## 2019-07-03 DIAGNOSIS — I609 Nontraumatic subarachnoid hemorrhage, unspecified: Secondary | ICD-10-CM | POA: Diagnosis not present

## 2019-07-03 DIAGNOSIS — I10 Essential (primary) hypertension: Secondary | ICD-10-CM | POA: Diagnosis not present

## 2019-07-03 DIAGNOSIS — D3500 Benign neoplasm of unspecified adrenal gland: Secondary | ICD-10-CM | POA: Diagnosis present

## 2019-07-03 DIAGNOSIS — R410 Disorientation, unspecified: Secondary | ICD-10-CM | POA: Diagnosis present

## 2019-07-03 DIAGNOSIS — F1910 Other psychoactive substance abuse, uncomplicated: Secondary | ICD-10-CM

## 2019-07-03 DIAGNOSIS — Z9861 Coronary angioplasty status: Secondary | ICD-10-CM

## 2019-07-03 DIAGNOSIS — H9190 Unspecified hearing loss, unspecified ear: Secondary | ICD-10-CM | POA: Diagnosis present

## 2019-07-03 DIAGNOSIS — F32A Depression, unspecified: Secondary | ICD-10-CM | POA: Diagnosis present

## 2019-07-03 DIAGNOSIS — I680 Cerebral amyloid angiopathy: Secondary | ICD-10-CM | POA: Diagnosis present

## 2019-07-03 DIAGNOSIS — Z888 Allergy status to other drugs, medicaments and biological substances status: Secondary | ICD-10-CM

## 2019-07-03 DIAGNOSIS — E854 Organ-limited amyloidosis: Secondary | ICD-10-CM | POA: Diagnosis present

## 2019-07-03 DIAGNOSIS — Z87891 Personal history of nicotine dependence: Secondary | ICD-10-CM

## 2019-07-03 DIAGNOSIS — E785 Hyperlipidemia, unspecified: Secondary | ICD-10-CM | POA: Diagnosis present

## 2019-07-03 DIAGNOSIS — F418 Other specified anxiety disorders: Secondary | ICD-10-CM | POA: Diagnosis present

## 2019-07-03 DIAGNOSIS — R4182 Altered mental status, unspecified: Secondary | ICD-10-CM

## 2019-07-03 DIAGNOSIS — R55 Syncope and collapse: Secondary | ICD-10-CM

## 2019-07-03 DIAGNOSIS — K219 Gastro-esophageal reflux disease without esophagitis: Secondary | ICD-10-CM | POA: Diagnosis present

## 2019-07-03 DIAGNOSIS — Z79899 Other long term (current) drug therapy: Secondary | ICD-10-CM

## 2019-07-03 LAB — URINALYSIS, ROUTINE W REFLEX MICROSCOPIC
Bilirubin Urine: NEGATIVE
Glucose, UA: NEGATIVE mg/dL
Hgb urine dipstick: NEGATIVE
Ketones, ur: 5 mg/dL — AB
Nitrite: NEGATIVE
Protein, ur: NEGATIVE mg/dL
Specific Gravity, Urine: 1.009 (ref 1.005–1.030)
pH: 8 (ref 5.0–8.0)

## 2019-07-03 LAB — CBC
HCT: 43.3 % (ref 36.0–46.0)
Hemoglobin: 13.8 g/dL (ref 12.0–15.0)
MCH: 30.3 pg (ref 26.0–34.0)
MCHC: 31.9 g/dL (ref 30.0–36.0)
MCV: 95.2 fL (ref 80.0–100.0)
Platelets: 234 10*3/uL (ref 150–400)
RBC: 4.55 MIL/uL (ref 3.87–5.11)
RDW: 13.3 % (ref 11.5–15.5)
WBC: 8.1 10*3/uL (ref 4.0–10.5)
nRBC: 0 % (ref 0.0–0.2)

## 2019-07-03 LAB — DIFFERENTIAL
Abs Immature Granulocytes: 0.04 10*3/uL (ref 0.00–0.07)
Basophils Absolute: 0.1 10*3/uL (ref 0.0–0.1)
Basophils Relative: 1 %
Eosinophils Absolute: 0.1 10*3/uL (ref 0.0–0.5)
Eosinophils Relative: 1 %
Immature Granulocytes: 1 %
Lymphocytes Relative: 17 %
Lymphs Abs: 1.4 10*3/uL (ref 0.7–4.0)
Monocytes Absolute: 0.3 10*3/uL (ref 0.1–1.0)
Monocytes Relative: 3 %
Neutro Abs: 6.3 10*3/uL (ref 1.7–7.7)
Neutrophils Relative %: 77 %

## 2019-07-03 LAB — RAPID URINE DRUG SCREEN, HOSP PERFORMED
Amphetamines: NOT DETECTED
Barbiturates: NOT DETECTED
Benzodiazepines: NOT DETECTED
Cocaine: NOT DETECTED
Opiates: NOT DETECTED
Tetrahydrocannabinol: POSITIVE — AB

## 2019-07-03 LAB — COMPREHENSIVE METABOLIC PANEL
ALT: 16 U/L (ref 0–44)
AST: 21 U/L (ref 15–41)
Albumin: 4.6 g/dL (ref 3.5–5.0)
Alkaline Phosphatase: 114 U/L (ref 38–126)
Anion gap: 9 (ref 5–15)
BUN: 12 mg/dL (ref 8–23)
CO2: 28 mmol/L (ref 22–32)
Calcium: 9.4 mg/dL (ref 8.9–10.3)
Chloride: 103 mmol/L (ref 98–111)
Creatinine, Ser: 0.73 mg/dL (ref 0.44–1.00)
GFR calc Af Amer: >60 mL/min (ref >60)
GFR calc non Af Amer: >60 mL/min (ref >60)
Glucose, Bld: 118 mg/dL — ABNORMAL HIGH (ref 70–99)
Potassium: 3.9 mmol/L (ref 3.5–5.1)
Sodium: 140 mmol/L (ref 135–145)
Total Bilirubin: 0.6 mg/dL (ref 0.3–1.2)
Total Protein: 8 g/dL (ref 6.5–8.1)

## 2019-07-03 LAB — ETHANOL: Alcohol, Ethyl (B): <10 mg/dL (ref <10)

## 2019-07-03 LAB — PROTIME-INR
INR: 1 (ref 0.8–1.2)
Prothrombin Time: 12.5 seconds (ref 11.4–15.2)

## 2019-07-03 LAB — ACETAMINOPHEN LEVEL: Acetaminophen (Tylenol), Serum: <10 ug/mL — ABNORMAL LOW (ref 10–30)

## 2019-07-03 LAB — CBG MONITORING, ED: Glucose-Capillary: 100 mg/dL — ABNORMAL HIGH (ref 70–99)

## 2019-07-03 LAB — APTT: aPTT: 28 seconds (ref 24–36)

## 2019-07-03 MED ORDER — BUTALBITAL-APAP-CAFFEINE 50-325-40 MG PO TABS
1.0000 | ORAL_TABLET | Freq: Four times a day (QID) | ORAL | Status: DC | PRN
  Administered 2019-07-04 (×2): 1 via ORAL
  Filled 2019-07-03 (×2): qty 1

## 2019-07-03 MED ORDER — LOSARTAN POTASSIUM 50 MG PO TABS
100.0000 mg | ORAL_TABLET | Freq: Every day | ORAL | Status: DC
  Administered 2019-07-04 – 2019-07-05 (×2): 100 mg via ORAL
  Filled 2019-07-03 (×3): qty 2

## 2019-07-03 MED ORDER — DULOXETINE HCL 30 MG PO CPEP: 30.0000 mg | ORAL_CAPSULE | Freq: Every day | ORAL | Status: DC

## 2019-07-03 MED ORDER — BUSPIRONE HCL 5 MG PO TABS
5.0000 mg | ORAL_TABLET | Freq: Two times a day (BID) | ORAL | Status: DC
  Administered 2019-07-03 – 2019-07-05 (×4): 5 mg via ORAL
  Filled 2019-07-03 (×4): qty 1

## 2019-07-03 MED ORDER — SIMVASTATIN 40 MG PO TABS
40.0000 mg | ORAL_TABLET | Freq: Every day | ORAL | Status: DC
  Administered 2019-07-04 (×2): 40 mg via ORAL
  Filled 2019-07-03: qty 1
  Filled 2019-07-03: qty 2

## 2019-07-03 MED ORDER — PANTOPRAZOLE SODIUM 40 MG PO TBEC
40.0000 mg | DELAYED_RELEASE_TABLET | Freq: Every day | ORAL | Status: DC
  Administered 2019-07-04 – 2019-07-05 (×2): 40 mg via ORAL
  Filled 2019-07-03 (×2): qty 1
  Filled 2019-07-03: qty 2

## 2019-07-03 MED ORDER — SODIUM CHLORIDE 0.9% FLUSH
3.0000 mL | Freq: Two times a day (BID) | INTRAVENOUS | Status: DC
  Administered 2019-07-03 – 2019-07-04 (×2): 3 mL via INTRAVENOUS

## 2019-07-03 MED ORDER — DULOXETINE HCL 60 MG PO CPEP
90.0000 mg | ORAL_CAPSULE | Freq: Every day | ORAL | Status: DC
  Administered 2019-07-04 – 2019-07-05 (×2): 90 mg via ORAL
  Filled 2019-07-03: qty 3
  Filled 2019-07-03 (×2): qty 1

## 2019-07-03 MED ORDER — MECLIZINE HCL 25 MG PO TABS
25.0000 mg | ORAL_TABLET | Freq: Two times a day (BID) | ORAL | Status: DC | PRN
  Administered 2019-07-04: 25 mg via ORAL
  Filled 2019-07-03: qty 1

## 2019-07-03 MED ORDER — SODIUM CHLORIDE 0.9% FLUSH: 3.0000 mL | INTRAVENOUS | Status: DC | PRN

## 2019-07-03 MED ORDER — SODIUM CHLORIDE 0.9% FLUSH: 3.0000 mL | Freq: Once | INTRAVENOUS | Status: DC

## 2019-07-03 MED ORDER — MEMANTINE HCL 10 MG PO TABS
10.0000 mg | ORAL_TABLET | Freq: Two times a day (BID) | ORAL | Status: DC
  Administered 2019-07-04 – 2019-07-05 (×4): 10 mg via ORAL
  Filled 2019-07-03 (×4): qty 1

## 2019-07-03 MED ORDER — PROPRANOLOL HCL 20 MG PO TABS
10.0000 mg | ORAL_TABLET | Freq: Two times a day (BID) | ORAL | Status: DC
  Administered 2019-07-04 – 2019-07-05 (×4): 10 mg via ORAL
  Filled 2019-07-03 (×7): qty 1

## 2019-07-03 MED ORDER — ENOXAPARIN SODIUM 40 MG/0.4ML ~~LOC~~ SOLN: 40.0000 mg | SUBCUTANEOUS | Status: DC

## 2019-07-03 MED ORDER — AMLODIPINE BESYLATE 5 MG PO TABS
5.0000 mg | ORAL_TABLET | Freq: Every day | ORAL | Status: DC
  Administered 2019-07-04 – 2019-07-05 (×2): 5 mg via ORAL
  Filled 2019-07-03 (×3): qty 1

## 2019-07-03 MED ORDER — SODIUM CHLORIDE 0.9 % IV SOLN: 250.0000 mL | INTRAVENOUS | Status: DC | PRN

## 2019-07-03 NOTE — ED Triage Notes (Signed)
Pt is disoriented. Pt is unsure of the year and president. Grips and strength equal bilaterally. Pt states that she has had lots of things happen to her head.  Pt doesn't remember her daughter's name. We cannot locate daughter at this time. Marijuana is smelt. Denies injury or trauma.  Last known well unknown.  No code stroke per [facility].

## 2019-07-03 NOTE — ED Provider Notes (Signed)
[place] DEPT Provider Note   CSN: [id number] Arrival date & time: 07/03/19  1425     History Chief Complaint  Patient presents with  . Altered Mental Status    Shelly Farrell is a 69 y.o. female.  Daughter reports pt is having episodes where she become sweaty and stops talking. Pt become flushed and stop responding.  Pt currently denies any complaints.  Pt has a history of a subarachnoid in 2019.  Pt has chronic mental decline.    The history is provided by the patient. No language interpreter was used.  Altered Mental Status Presenting symptoms: partial responsiveness   Severity:  Severe Most recent episode:  Today Episode history:  Multiple Timing:  Constant Progression:  Waxing and waning Chronicity:  New Associated symptoms: nausea        Past Medical History:  Diagnosis Date  . Anxiety   . Arthritis   . Depression   . GERD (gastroesophageal reflux disease)   . Hyperlipidemia   . Hypertension   . Neuromuscular disorder [facility])     Patient Active Problem List   Diagnosis Date Noted  . 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 Cognitive decline 03/11/2019  . Hypokalemia 03/11/2019  . Expressive aphasia 03/11/2019  . 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 ACE inhibitor-aggravated angioedema, initial encounter 07/26/2018  . ACE inhibitor-aggravated angioedema 07/26/2018  . Depression   . Hypertension   . GERD (gastroesophageal reflux disease)   . Hyperlipidemia   . Skull fracture with cerebral contusion, closed, initial encounter ([place]) 03/02/2017    Past Surgical History:  Procedure Laterality Date  . ABDOMINAL HYSTERECTOMY    . TUBAL LIGATION       OB History   No obstetric history on file.     Family History  Problem Relation Age of Onset  . Cancer Father   . Hepatitis C Daughter     Social History   Tobacco Use  . Smoking status: Former Research scientist (life sciences)  . Smokeless  tobacco: Never Used  Vaping Use  . Vaping Use: Never used  Substance Use Topics  . Alcohol use: No    Alcohol/week: 0.0 standard drinks  . Drug use: No    Home Medications Prior to Admission medications   Medication Sig Start Date End Date Taking? Authorizing Provider  amLODipine (NORVASC) 10 MG tablet Take 10 mg by mouth daily. 06/11/19  Yes [provider]  ascorbic Acid (VITAMIN C) 500 MG CPCR Take 1 capsule by mouth daily.   Yes [provider]  busPIRone (BUSPAR) 5 MG tablet Take 5 mg by mouth 2 (two) times daily as needed for anxiety. 03/20/19  Yes [provider]  butalbital-acetaminophen-caffeine (FIORICET) 50-325-40 MG tablet Take 1 tablet by mouth every 6 (six) hours as needed for headache. 03/11/19  Yes Donzetta Starch, NP  cholecalciferol (VITAMIN D) 1000 units tablet Take 1,000 Units by mouth daily.   Yes [provider]  DULoxetine (CYMBALTA) 30 MG capsule Take 30 mg by mouth daily. 06/17/19  Yes [provider]  DULoxetine (CYMBALTA) 60 MG capsule Take 60 mg by mouth daily.  02/14/18  Yes [provider]  losartan (COZAAR) 50 MG tablet Take 50 mg by mouth daily. 02/28/19  Yes [provider]  meclizine (ANTIVERT) 25 MG tablet Take 25 mg by mouth 2 (two) times daily as needed for dizziness. 11/23/18  Yes [provider]  memantine (NAMENDA) 10 MG tablet Take 10 mg by  mouth 2 (two) times daily. 05/21/19  Yes [provider]  Multiple Vitamin (MULTIVITAMIN WITH MINERALS) TABS tablet Take 1 tablet by mouth daily.   Yes [provider]  omeprazole (PRILOSEC) 20 MG capsule Take 20 mg by mouth daily.   Yes [provider]  propranolol (INDERAL) 10 MG tablet Take 10 mg by mouth 2 (two) times daily. 02/25/19  Yes [provider]  simvastatin (ZOCOR) 40 MG tablet Take 1 tablet (40 mg total) by mouth at bedtime. 03/11/19  Yes Donzetta Starch, NP    Allergies    Lisinopril and  Donepezil  Review of Systems   Review of Systems  Gastrointestinal: Positive for nausea.  All other systems reviewed and are negative.   Physical Exam Updated Vital Signs BP (!) 150/55   Pulse (!) 53   Temp 97.9 F (36.6 C) (Oral)   Resp 18   SpO2 100%   Physical Exam Vitals and nursing note reviewed.  Constitutional:      Appearance: She is well-developed.  HENT:     Head: Normocephalic.     Nose: Nose normal.     Mouth/Throat:     Mouth: Mucous membranes are moist.  Eyes:     Extraocular Movements: Extraocular movements intact.     Pupils: Pupils are equal, round, and reactive to light.  Cardiovascular:     Rate and Rhythm: Normal rate and regular rhythm.  Pulmonary:     Effort: Pulmonary effort is normal.  Abdominal:     General: Abdomen is flat. There is no distension.  Musculoskeletal:        General: Normal range of motion.     Cervical back: Normal range of motion.  Skin:    General: Skin is warm.  Neurological:     General: No focal deficit present.     Mental Status: She is alert.     Comments: Confused, hard of hearing  Psychiatric:        Mood and Affect: Mood normal.     ED Results / Procedures / Treatments   Labs (all labs ordered are listed, but only abnormal results are displayed) Labs Reviewed  COMPREHENSIVE METABOLIC PANEL - Abnormal; Notable for the following components:      Result Value   Glucose, Bld 118 (*)    All other components within normal limits  ACETAMINOPHEN LEVEL - Abnormal; Notable for the following components:   Acetaminophen (Tylenol), Serum <10 (*)    All other components within normal limits  RAPID URINE DRUG SCREEN, HOSP PERFORMED - Abnormal; Notable for the following components:   Tetrahydrocannabinol POSITIVE (*)    All other components within normal limits  URINALYSIS, ROUTINE W REFLEX MICROSCOPIC - Abnormal; Notable for the following components:   Ketones, ur 5 (*)    Leukocytes,Ua SMALL (*)    Bacteria, UA  RARE (*)    All other components within normal limits  CBG MONITORING, ED - Abnormal; Notable for the following components:   Glucose-Capillary 100 (*)    All other components within normal limits  CBC  ETHANOL  PROTIME-INR  APTT  DIFFERENTIAL    EKG None  Radiology CT Head Wo Contrast  Result Date: 07/03/2019 CLINICAL DATA:  Mental status change, persistent or worsening. Additional history provided: Patient disoriented, unsure of ear and president, EXAM: CT HEAD WITHOUT CONTRAST TECHNIQUE: Contiguous axial images were obtained from the base of the skull through the vertex without intravenous contrast. COMPARISON:  Prior head CT 03/09/2019, brain MRI  03/08/2019 FINDINGS: Brain: Stable, mild generalized parenchymal atrophy. There is small volume acute subarachnoid hemorrhage overlying the mid to posterior right frontal lobe (for instance as seen on series 2, image 23) (series 5, image 16). Stable moderate patchy hypodensity within the cerebral white matter which is nonspecific, but consistent with chronic small vessel ischemic disease. No demarcated cortical infarct is identified. No evidence of intracranial mass. No midline shift. Vascular: No hyperdense vessel.  Atherosclerotic calcifications Skull: Chronic left occipital calvarial fracture. No acute calvarial fracture is identified. Sinuses/Orbits: Visualized orbits show no acute finding. Trace right sphenoid sinus mucosal thickening. No significant mastoid effusion. These results were called by telephone at the time of interpretation on 07/03/2019 at 5:12 pm to provider Dr. Roderic Palau, who verbally acknowledged these results. IMPRESSION: Small volume acute subarachnoid hemorrhage overlying the mid to posterior right frontal lobe. Stable mild generalized parenchymal atrophy and moderate chronic small vessel ischemic disease. Redemonstrated chronic left occipital calvarial fracture. Minimal paranasal sinus mucosal thickening. Electronically Signed    By: Kellie Simmering DO   On: 07/03/2019 17:12    Procedures Procedures (including critical care time)  Medications Ordered in ED Medications  sodium chloride flush (NS) 0.9 % injection 3 mL (3 mLs Intravenous Not Given 07/03/19 1519)    ED Course  I have reviewed the triage vital signs and the nursing notes.  Pertinent labs & imaging results that were available during my care of the patient were reviewed by me and considered in my medical decision making (see chart for details).    MDM Rules/Calculators/A&P                         Ct scan shows small subdural.  I spoke to Neurosurgery who advised no intervention or follow up needed.   I spoke with pt's daughter.  She  Syncopal/sweating episode observe by student lasted approx 1 minute, Pt unresponsive flushed and sweating above her lip.   Pt has marijuana in urine   Pt admits to use.  Hospitalist consulted for admission Final Clinical Impression(s) / ED Diagnoses Final diagnoses:  Altered mental status, unspecified altered mental status type  Syncope, unspecified syncope type    Rx / DC Orders ED Discharge Orders    None       Sidney Ace 07/03/19 2145    Milton Ferguson, MD 07/03/19 2209

## 2019-07-03 NOTE — ED Notes (Signed)
MD aware of delay in IV access. CT changed to w/o. IV team consulted.

## 2019-07-03 NOTE — ED Notes (Signed)
Nara Visa on Patient.

## 2019-07-03 NOTE — ED Notes (Signed)
Pt ambulatory to RR 

## 2019-07-03 NOTE — ED Notes (Signed)
Called pts daughter to update on condition. Pt daughter otw for visitation

## 2019-07-03 NOTE — H&P (Signed)
History and Physical    Shelly Farrell:[id number] DOB: 1950/06/11 DOA: 07/03/2019  PCP: [facility] (Confirm with patient/family/NH records and if not entered, this has to be entered at [facility] point of entry) Patient coming from: home  I have personally briefly reviewed patient's old medical records in [place]  Chief Complaint: HA, increased confusion and episodes of changed LOC  HPI: Shelly Farrell is a 69 y.o. female with medical history significant of HTN, chronic HA, Neuromuscular disorder, Depression with anxiety. 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 She has been seen as an outpatient by neurosurgery and neurology. She presents to the [facility], brought by her daughter, for increased confusion and because she has had multiple episodes where she will flush, become sweaty and be unresponsive for a short period of time - several minutes - followed by return to normal with no described post-ictal symptoms.   ED Course: VSS, patient had an unremarkable exam but was witnessed having an episode of flushing, sweating and decreased consciousness. Initial labs including CMet, CBC, U/A were negative; CT head revealed a small SAH. Because of verified episodes of flushing, sweating and decreased LOC she is referred to [facility] for observation and further evaluation.  Review of Systems: As per HPI otherwise 10 point review of systems negative.    Past Medical History:  Diagnosis Date   Anxiety    Arthritis    Depression    GERD (gastroesophageal reflux disease)    Hyperlipidemia    Hypertension    Neuromuscular disorder ([place])     Past Surgical History:  Procedure Laterality Date   ABDOMINAL HYSTERECTOMY     TUBAL LIGATION     Soc Hx -  Married x 2: 1st - she can't not recall how long she was married; 2nd - >30 years. She has One daughter, 1 son, 4 grandchildren. She worked but  cannot recall what she did. She lives with her husband and her daughter lives with them. She does admit to [facility] use.   reports that she has quit smoking. She has never used smokeless tobacco. She reports that she does not drink alcohol and does not use drugs.  Allergies  Allergen Reactions   Lisinopril Swelling    Angioedema   Donepezil     Other reaction(s): Agitation nightmares    Family History  Problem Relation Age of Onset   Cancer Father    Hepatitis C Daughter      Prior to Admission medications   Medication Sig Start Date End Date Taking? Authorizing Provider  amLODipine (NORVASC) 10 MG tablet Take 10 mg by mouth daily. 06/11/19  Yes [provider]  ascorbic Acid (VITAMIN C) 500 MG CPCR Take 1 capsule by mouth daily.   Yes [provider]  busPIRone (BUSPAR) 5 MG tablet Take 5 mg by mouth 2 (two) times daily as needed for anxiety. 03/20/19  Yes [provider]  butalbital-acetaminophen-caffeine (FIORICET) 50-325-40 MG tablet Take 1 tablet by mouth every 6 (six) hours as needed for headache. 03/11/19  Yes Donzetta Starch, NP  cholecalciferol (VITAMIN D) 1000 units tablet Take 1,000 Units by mouth daily.   Yes [provider]  DULoxetine (CYMBALTA) 30 MG capsule Take 30 mg by mouth daily. 06/17/19  Yes [provider]  DULoxetine (CYMBALTA) 60 MG capsule Take 60 mg by mouth daily.  02/14/18  Yes [provider]  losartan (COZAAR) 50  MG tablet Take 50 mg by mouth daily. 02/28/19  Yes [provider]  meclizine (ANTIVERT) 25 MG tablet Take 25 mg by mouth 2 (two) times daily as needed for dizziness. 11/23/18  Yes [provider]  memantine (NAMENDA) 10 MG tablet Take 10 mg by mouth 2 (two) times daily. 05/21/19  Yes [provider]  Multiple Vitamin (MULTIVITAMIN WITH MINERALS) TABS tablet Take 1 tablet by mouth daily.   Yes [provider]  omeprazole (PRILOSEC) 20 MG capsule Take 20 mg by mouth  daily.   Yes [provider]  propranolol (INDERAL) 10 MG tablet Take 10 mg by mouth 2 (two) times daily. 02/25/19  Yes [provider]  simvastatin (ZOCOR) 40 MG tablet Take 1 tablet (40 mg total) by mouth at bedtime. 03/11/19  Yes Donzetta Starch, NP    Physical Exam: Vitals:   07/03/19 2100 07/03/19 2130 07/03/19 2200 07/03/19 2230  BP:  118/70 127/62 (!) 115/95  Pulse:  79 77 83  Resp: 18 18 18 18   Temp:      TempSrc:      SpO2:  95% 96% 96%    Constitutional: NAD, calm, comfortable Vitals:   07/03/19 2100 07/03/19 2130 07/03/19 2200 07/03/19 2230  BP:  118/70 127/62 (!) 115/95  Pulse:  79 77 83  Resp: 18 18 18 18   Temp:      TempSrc:      SpO2:  95% 96% 96%   General -  WNWD woman who is HOH and has memory deficits. Sheech is minimally dysarthric. Eyes: PERRL, lids and conjunctivae normal ENMT: Mucous membranes are moist. Posterior pharynx clear of any exudate or lesions.Normal dentition.  Neck: normal, supple, no masses, no thyromegaly Respiratory: clear to auscultation bilaterally, no wheezing, no crackles. Normal respiratory effort. No accessory muscle use.  Cardiovascular: Regular rate and rhythm, no murmurs / rubs / gallops. No extremity edema. 2+ pedal pulses. No carotid bruits.  Abdomen: no tenderness, no masses palpated. No hepatosplenomegaly. Bowel sounds positive.  Musculoskeletal: no clubbing / cyanosis. No joint deformity upper and lower extremities. Good ROM, no contractures. Normal muscle tone.  Skin: no rashes, lesions, ulcers. No induration Neurologic: CN 2-12 grossly intact.  DTR normal. Strength 5/5 in all 4. Witnessed her to become flushed, sweaty and she would be distracted  With frequent sighs, increased confusion.  Psychiatric: Slow cognition compounded by hearing loss, normal mood    Labs on Admission: I have personally reviewed following labs and imaging studies  CBC: Recent Labs  Lab 07/03/19 1542  WBC 8.1  NEUTROABS 6.3  HGB  13.8  HCT 43.3  MCV 95.2  PLT 761   Basic Metabolic Panel: Recent Labs  Lab 07/03/19 1542  NA 140  K 3.9  CL 103  CO2 28  GLUCOSE 118*  BUN 12  CREATININE 0.73  CALCIUM 9.4   GFR: CrCl cannot be calculated (Unknown ideal weight.). Liver Function Tests: Recent Labs  Lab 07/03/19 1542  AST 21  ALT 16  ALKPHOS 114  BILITOT 0.6  PROT 8.0  ALBUMIN 4.6   No results for input(s): LIPASE, AMYLASE in the last 168 hours. No results for input(s): AMMONIA in the last 168 hours. Coagulation Profile: Recent Labs  Lab 07/03/19 1542  INR 1.0   Cardiac Enzymes: No results for input(s): CKTOTAL, CKMB, CKMBINDEX, TROPONINI in the last 168 hours. BNP (last 3 results) No results for input(s): PROBNP in the last 8760 hours. HbA1C: No results for input(s): HGBA1C in  the last 72 hours. CBG: Recent Labs  Lab 07/03/19 1517  GLUCAP 100*   Lipid Profile: No results for input(s): CHOL, HDL, LDLCALC, TRIG, CHOLHDL, LDLDIRECT in the last 72 hours. Thyroid Function Tests: No results for input(s): TSH, T4TOTAL, FREET4, T3FREE, THYROIDAB in the last 72 hours. Anemia Panel: No results for input(s): VITAMINB12, FOLATE, FERRITIN, TIBC, IRON, RETICCTPCT in the last 72 hours. Urine analysis:    Component Value Date/Time   COLORURINE YELLOW 07/03/2019 1809   APPEARANCEUR CLEAR 07/03/2019 1809   LABSPEC 1.009 07/03/2019 1809   PHURINE 8.0 07/03/2019 1809   GLUCOSEU NEGATIVE 07/03/2019 1809   HGBUR NEGATIVE 07/03/2019 1809   BILIRUBINUR NEGATIVE 07/03/2019 1809   KETONESUR 5 (A) 07/03/2019 1809   PROTEINUR NEGATIVE 07/03/2019 1809   NITRITE NEGATIVE 07/03/2019 1809   LEUKOCYTESUR SMALL (A) 07/03/2019 1809    Radiological Exams on Admission: CT Head Wo Contrast  Result Date: 07/03/2019 CLINICAL DATA:  Mental status change, persistent or worsening. Additional history provided: Patient disoriented, unsure of ear and president, EXAM: CT HEAD WITHOUT CONTRAST TECHNIQUE: Contiguous axial  images were obtained from the base of the skull through the vertex without intravenous contrast. COMPARISON:  Prior head CT 03/09/2019, brain MRI 03/08/2019 FINDINGS: Brain: Stable, mild generalized parenchymal atrophy. There is small volume acute subarachnoid hemorrhage overlying the mid to posterior right frontal lobe (for instance as seen on series 2, image 23) (series 5, image 16). Stable moderate patchy hypodensity within the cerebral white matter which is nonspecific, but consistent with chronic small vessel ischemic disease. No demarcated cortical infarct is identified. No evidence of intracranial mass. No midline shift. Vascular: No hyperdense vessel.  Atherosclerotic calcifications Skull: Chronic left occipital calvarial fracture. No acute calvarial fracture is identified. Sinuses/Orbits: Visualized orbits show no acute finding. Trace right sphenoid sinus mucosal thickening. No significant mastoid effusion. These results were called by telephone at the time of interpretation on 07/03/2019 at 5:12 pm to provider Dr. Roderic Palau, who verbally acknowledged these results. IMPRESSION: Small volume acute subarachnoid hemorrhage overlying the mid to posterior right frontal lobe. Stable mild generalized parenchymal atrophy and moderate chronic small vessel ischemic disease. Redemonstrated chronic left occipital calvarial fracture. Minimal paranasal sinus mucosal thickening. Electronically Signed   By: Kellie Simmering DO   On: 07/03/2019 17:12    EKG: Independently reviewed. NSR, LAE, old anteroseptal injury.  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 (For example, if patient is on BP meds at home and you resume or decide to hold them, it is a problem that needs to be her. Same for CAD, COPD, HLD and so on)   1. SAH - small. Previous workup with  diagnosis of angiopathy as source of bleeding. Current films reviewed by Dr. Arnoldo Morale on-call for NS who opined that no intervention was needed.  2. Confusion - patient with episodes of flushing, sweating and increased confusion. She does have THC on board but this doesn't explain symptoms. Question of adrenergic surge, e.g. pheochromocytosis with the triad of HA, sweating/flushing and HTN with episodes of poor control. Plan 24 hour urine for metanephrines and catecholamines  CT abdomen  Neurology consult  3. HTN - continue home meds.  4. Depression - continue home meds. Question of SNRIs interfering with above testing though usually SSRIs are problematic.   DVT prophylaxis: TED hose  Code Status: full code Family  Communication: Spoke with daughter during exam  Disposition Plan: Home 24-48 hrs  Consults called: Dr. Arnoldo Morale for NS was consulted by EDP  Admission status: obs    Adella Hare MD [facility] Pager [phone]  If 7PM-7AM, please contact night-coverage [email or web address] Password [email or web address]  07/03/2019, 10:59 PM

## 2019-07-03 NOTE — ED Notes (Signed)
Pt transported to imaging.

## 2019-07-03 NOTE — ED Provider Notes (Signed)
MSE was initiated and I personally evaluated the patient and placed orders (if any) at  2:44 PM on July 03, 2019.  The patient appears stable so that the remainder of the MSE may be completed by another provider.  Patient evaluated by me in triage.  Patient brought here by daughter.  Patient speaking okay.  States that she is getting these waves of headache.  Then now makes her confused.  Patient with good speech initially denied any visual changes.  Good movement of upper extremities lower extremities no evidence of any significant focal neuro deficit.  But not exactly clear what is ongoing.  Patient's blood pressure is normal at 120/79 no fever.  Heart rate 76.  Oxygen saturations room air 96%.  Were trying to locate her daughter to get more information in the meantime will activate stroke order set but patient is not a code stroke candidate at this time.   Fredia Sorrow, MD 07/03/19 [phone]

## 2019-07-04 ENCOUNTER — Observation Stay (HOSPITAL_COMMUNITY): Payer: Medicare Other

## 2019-07-04 DIAGNOSIS — Z20822 Contact with and (suspected) exposure to covid-19: Secondary | ICD-10-CM | POA: Diagnosis present

## 2019-07-04 DIAGNOSIS — R410 Disorientation, unspecified: Secondary | ICD-10-CM | POA: Diagnosis not present

## 2019-07-04 DIAGNOSIS — I10 Essential (primary) hypertension: Secondary | ICD-10-CM | POA: Diagnosis present

## 2019-07-04 DIAGNOSIS — Z87891 Personal history of nicotine dependence: Secondary | ICD-10-CM | POA: Diagnosis not present

## 2019-07-04 DIAGNOSIS — E854 Organ-limited amyloidosis: Secondary | ICD-10-CM | POA: Diagnosis present

## 2019-07-04 DIAGNOSIS — D3500 Benign neoplasm of unspecified adrenal gland: Secondary | ICD-10-CM | POA: Diagnosis present

## 2019-07-04 DIAGNOSIS — Z888 Allergy status to other drugs, medicaments and biological substances status: Secondary | ICD-10-CM | POA: Diagnosis not present

## 2019-07-04 DIAGNOSIS — E785 Hyperlipidemia, unspecified: Secondary | ICD-10-CM | POA: Diagnosis present

## 2019-07-04 DIAGNOSIS — F418 Other specified anxiety disorders: Secondary | ICD-10-CM | POA: Diagnosis present

## 2019-07-04 DIAGNOSIS — F329 Major depressive disorder, single episode, unspecified: Secondary | ICD-10-CM | POA: Diagnosis present

## 2019-07-04 DIAGNOSIS — F1910 Other psychoactive substance abuse, uncomplicated: Secondary | ICD-10-CM | POA: Diagnosis present

## 2019-07-04 DIAGNOSIS — I609 Nontraumatic subarachnoid hemorrhage, unspecified: Secondary | ICD-10-CM | POA: Diagnosis present

## 2019-07-04 DIAGNOSIS — Z79899 Other long term (current) drug therapy: Secondary | ICD-10-CM | POA: Diagnosis not present

## 2019-07-04 DIAGNOSIS — K219 Gastro-esophageal reflux disease without esophagitis: Secondary | ICD-10-CM | POA: Diagnosis present

## 2019-07-04 DIAGNOSIS — I680 Cerebral amyloid angiopathy: Secondary | ICD-10-CM | POA: Diagnosis present

## 2019-07-04 DIAGNOSIS — H9190 Unspecified hearing loss, unspecified ear: Secondary | ICD-10-CM | POA: Diagnosis present

## 2019-07-04 DIAGNOSIS — Z9861 Coronary angioplasty status: Secondary | ICD-10-CM | POA: Diagnosis not present

## 2019-07-04 LAB — SARS CORONAVIRUS 2 (TAT 6-24 HRS): SARS Coronavirus 2: NEGATIVE

## 2019-07-04 MED ORDER — IOHEXOL 9 MG/ML PO SOLN
500.0000 mL | ORAL | Status: AC
  Administered 2019-07-04 (×2): 500 mL via ORAL

## 2019-07-04 MED ORDER — IOHEXOL 9 MG/ML PO SOLN
ORAL | Status: AC
  Filled 2019-07-04: qty 1000

## 2019-07-04 NOTE — Progress Notes (Signed)
PROGRESS NOTE    Shelly Farrell  KNL:[id number] DOB: [birth year] DOA: 07/03/2019 PCP: [facility]   Brief Narrative: Patient is a 69 year old female with history of hypertension, chronic headache, neuromuscular disorder, depression, anxiety, subarachnoid hemorrhage in March 21 secondary to angiopathy with symptoms of expressive dysphagia currently following with neurosurgery/neurology presented to the emergency department for increased confusion, flushing/sweating, short period of unresponsiveness followed by return to normal with no postictal symptoms.  On presentation, she was hypertensive.  Lab works were nonreassuring.  CT head revealed a small subarachnoid hemorrhage.  Neurosurgery did not recommend any intervention.  Due to episodes of flushing/sweating/hypertension pheochromocytoma was suspected.  24-hour metanephrines/catecholamines ordered by admitting physician.  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  Neurology was consulted who did not recommend any intervention.  She had history of subarachnoid hemorrhage in the past which was attributed to angiopathy.  She was following with neurosurgery and neurology as an outpatient. She was also confused on presentation.  Currently alert and oriented.  Will request for PT evaluation.  Confusion: Alternating with episodes of flushing, sweating,confusion.  UDS was positive for THC.  There was suspicion for adrenergic surge/pheochromocytoma.  24-hour urine for metanephrines/catecholamines ordered.  CT abdomen did not show any intra-abdominal malignancy or neoplasm. Also  Showed cholelithiasis without any features of cholecystitis.  Hypertension: Home  medications have been resumed.  She was hypertensive on presentation.  Continue to monitor blood pressure on the background of subarachnoid hemorrhage  Headache: Continue supportive care, pain management.  Denies any headache this morning.  Depression: On home medications.          DVT prophylaxis:SCD Code Status: Full Family Communication: Husband on phone on 07/04/19 Status [id number]  Is to be inpatient due to ongoing work-up  Dispo: The patient is from: Home              Anticipated d/c is to: Home              Anticipated d/c date is: 1 day              Patient currently is not medically stable to d/c.    Consultants: None  Procedures:None  Antimicrobials:  Anti-infectives (From admission, onward)   None      Subjective: Patient seen and examined at the bedside this morning.  Hemodynamically stable today.  Very hard of hearing.  Alert and oriented.  Denies any complaints.  No headache today.  Objective: Vitals:   07/04/19 0100 07/04/19 0152 07/04/19 0335 07/04/19 0532  BP: (!) 143/74 (!) 141/64 101/86 140/62  Pulse: 72 67 70 66  Resp: 18 17 17 19   Temp:  (!) 97.4 F (36.3 C) 98.4 F (36.9 C) 98.2 F (36.8 C)  TempSrc:  Oral Oral Oral  SpO2: 96% 97% 98% 96%  Weight:  61.6 kg    Height:  5' (1.524 m)      Intake/Output Summary (Last 24 hours) at 07/04/2019 0801 Last data filed at 07/04/2019 0532 Gross per 24 hour  Intake 0 ml  Output 300 ml  Net -300 ml   Filed Weights   07/04/19 0152  Weight: 61.6 kg    Examination:  General exam: Appears calm and comfortable ,Not in distress, very hard  of hearing HEENT:PERRL,Oral mucosa moist, Ear/Nose normal on gross exam Respiratory system: Bilateral equal air entry, normal vesicular breath sounds, no wheezes or crackles  Cardiovascular system: S1 & S2 heard, RRR. No JVD, murmurs, rubs, gallops or clicks. No pedal edema. Gastrointestinal system: Abdomen is nondistended, soft and nontender. No organomegaly or  masses felt. Normal bowel sounds heard. Central nervous system: Alert and oriented. No focal neurological deficits. Extremities: No edema, no clubbing ,no cyanosis Skin: No rashes, lesions or ulcers,no icterus ,no pallor   Data Reviewed: I have personally reviewed following labs and imaging studies  CBC: Recent Labs  Lab 07/03/19 1542  WBC 8.1  NEUTROABS 6.3  HGB 13.8  HCT 43.3  MCV 95.2  PLT 433   Basic Metabolic Panel: Recent Labs  Lab 07/03/19 1542  NA 140  K 3.9  CL 103  CO2 28  GLUCOSE 118*  BUN 12  CREATININE 0.73  CALCIUM 9.4   GFR: Estimated Creatinine Clearance: 55.1 mL/min (by C-G formula based on SCr of 0.73 mg/dL). Liver Function Tests: Recent Labs  Lab 07/03/19 1542  AST 21  ALT 16  ALKPHOS 114  BILITOT 0.6  PROT 8.0  ALBUMIN 4.6   No results for input(s): LIPASE, AMYLASE in the last 168 hours. No results for input(s): AMMONIA in the last 168 hours. Coagulation Profile: Recent Labs  Lab 07/03/19 1542  INR 1.0   Cardiac Enzymes: No results for input(s): CKTOTAL, CKMB, CKMBINDEX, TROPONINI in the last 168 hours. BNP (last 3 results) No results for input(s): PROBNP in the last 8760 hours. HbA1C: No results for input(s): HGBA1C in the last 72 hours. CBG: Recent Labs  Lab 07/03/19 1517  GLUCAP 100*   Lipid Profile: No results for input(s): CHOL, HDL, LDLCALC, TRIG, CHOLHDL, LDLDIRECT in the last 72 hours. Thyroid Function Tests: No results for input(s): TSH, T4TOTAL, FREET4, T3FREE, THYROIDAB in the last 72 hours. Anemia Panel: No results for input(s): VITAMINB12, FOLATE, FERRITIN, TIBC, IRON, RETICCTPCT in the last 72 hours. Sepsis Labs: No results for input(s): PROCALCITON, LATICACIDVEN in the last 168 hours.  No results found for this or any previous visit (from the past 240 hour(s)).       Radiology Studies: CT Head Wo Contrast  Result Date: 07/03/2019 CLINICAL DATA:  Mental status change, persistent or worsening.  Additional history provided: Patient disoriented, unsure of ear and president, EXAM: CT HEAD WITHOUT CONTRAST TECHNIQUE: Contiguous axial images were obtained from the base of the skull through the vertex without intravenous contrast. COMPARISON:  Prior head CT 03/09/2019, brain MRI 03/08/2019 FINDINGS: Brain: Stable, mild generalized parenchymal atrophy. There is small volume acute subarachnoid hemorrhage overlying the mid to posterior right frontal lobe (for instance as seen on series 2, image 23) (series 5, image 16). Stable moderate patchy hypodensity within the cerebral white matter which is nonspecific, but consistent with chronic small vessel ischemic disease. No demarcated cortical infarct is identified. No evidence of intracranial mass. No midline shift. Vascular: No hyperdense vessel.  Atherosclerotic calcifications Skull: Chronic left occipital calvarial fracture. No acute calvarial fracture is identified. Sinuses/Orbits: Visualized orbits show no acute finding. Trace right sphenoid sinus mucosal thickening. No significant mastoid effusion. These results were called by telephone at the time of interpretation on 07/03/2019 at 5:12 pm to provider Dr. Roderic Palau, who verbally acknowledged these results. IMPRESSION: Small volume acute subarachnoid hemorrhage overlying the mid to posterior right frontal lobe. Stable mild generalized parenchymal atrophy and moderate chronic small vessel ischemic disease. Redemonstrated chronic left occipital calvarial  fracture. Minimal paranasal sinus mucosal thickening. Electronically Signed   By: Kellie Simmering DO   On: 07/03/2019 17:12        Scheduled Meds: . iohexol      . amLODipine  5 mg Oral Daily  . busPIRone  5 mg Oral BID  . DULoxetine  90 mg Oral Daily  . iohexol  500 mL Oral Q1H  . losartan  100 mg Oral Daily  . memantine  10 mg Oral BID  . pantoprazole  40 mg Oral Daily  . propranolol  10 mg Oral BID  . simvastatin  40 mg Oral QHS  . sodium chloride  flush  3 mL Intravenous Once  . sodium chloride flush  3 mL Intravenous Q12H   Continuous Infusions: . sodium chloride       LOS: 0 days    Time spent: More than 50% of that time was spent in counseling and/or coordination of care.      Shelly Coss, MD [facility] P7/01/2019, 8:01 AM

## 2019-07-04 NOTE — Progress Notes (Signed)
Pt admitted into 1531 at 0200, Her dtr Anderson Malta was called by me and informed of pts room #. Pt is able to follow commands and answer most questions but is oriented to name only. No other neuro deficit noted. Placed on tele, ambulated to BR with standby assist and gait appeared steady.

## 2019-07-05 DIAGNOSIS — I609 Nontraumatic subarachnoid hemorrhage, unspecified: Principal | ICD-10-CM

## 2019-07-05 NOTE — Discharge Summary (Signed)
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  On presentation, she was hypertensive.  Lab works were nonreassuring.  CT head revealed a small subarachnoid hemorrhage.  Neurosurgery did not recommend any intervention.  Due to episodes of flushing/sweating/hypertension pheochromocytoma , 24-hour metanephrines/catecholamines ordered by admitting physician,results not got yet.  She remains hemodynamically stable for discharge to home today.  We will follow-up on 24-hour metanephrine/catecholamine report and call the patient if found to be abnormal.  Following problems were addressed during her hospitalization:   Acute subarachnoid hemorrhage:CT head showed small volume acute subarachnoid hemorrhage overlying the mid to posterior right frontal lobe.  Neurology was consulted who did not recommend any intervention.  She had history of subarachnoid hemorrhage in the past which was attributed to angiopathy.  She will  following with neurosurgery  as an outpatient. She was also confused on presentation.  Currently alert and oriented.    Confusion: Alternating with episodes of flushing, sweating,confusion reported.  UDS was positive for THC.  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  24-hour urine for metanephrines/catecholamines ordered and collected.  CT abdomen did not show any intra-abdominal malignancy or neoplasm.  Hypertension: Home medications have been resumed.  She was hypertensive on presentation. BP stable at present  Headache: Continue supportive care, pain management.  Denies any headache this morning.  Depression: On home medications.   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 2)Follow up with neurosurgery in 1 to 2 weeks.  Name and number of the provider group has been attached.Call for appointment   Increase activity slowly   Complete by: As directed      Allergies as of 07/05/2019      Reactions   Lisinopril Swelling   Angioedema   Donepezil    Other reaction(s): Agitation nightmares      Medication List    TAKE these medications   amLODipine 10 MG tablet Commonly known as: NORVASC Take 10 mg by mouth daily.   ascorbic Acid 500 MG Cpcr Commonly known as: VITAMIN C Take 1 capsule by mouth daily.   busPIRone 5 MG tablet Commonly known as: BUSPAR Take 5 mg by mouth 2 (two) times daily as needed for anxiety.   butalbital-acetaminophen-caffeine 50-325-40 MG tablet Commonly known as: FIORICET Take 1 tablet by mouth every 6 (six) hours as needed for headache.   cholecalciferol 1000 units tablet Commonly known as: VITAMIN D Take 1,000 Units by mouth daily.   DULoxetine 60 MG capsule Commonly known as: CYMBALTA Take 60 mg by  mouth daily.   DULoxetine 30 MG capsule Commonly known as: CYMBALTA Take 30 mg  by mouth daily.   losartan 50 MG tablet Commonly known as: COZAAR Take 50 mg by mouth daily.   meclizine 25 MG tablet Commonly known as: ANTIVERT Take 25 mg by mouth 2 (two) times daily as needed for dizziness.   memantine 10 MG tablet Commonly known as: NAMENDA Take 10 mg by mouth 2 (two) times daily.   multivitamin with minerals Tabs tablet Take 1 tablet by mouth daily.   omeprazole 20 MG capsule Commonly known as: PRILOSEC Take 20 mg by mouth daily.   propranolol 10 MG tablet Commonly known as: INDERAL Take 10 mg by mouth 2 (two) times daily.   simvastatin 40 MG tablet Commonly known as: ZOCOR Take 1 tablet (40 mg total) by mouth at bedtime.       Follow-up Information    [facility]. Schedule an appointment as soon as possible for a visit in 1 week(s).   Specialty: Family Medicine Contact information: [place] [phone] [place], [facility]. Schedule an appointment as soon as possible for a visit in 1 week(s).   Specialty: Neurosurgery Contact information: [address] [place] [address] [phone]              Allergies  Allergen Reactions  . Lisinopril Swelling    Angioedema  . Donepezil     Other reaction(s): Agitation nightmares    Consultations:  None   Procedures/Studies: CT ABDOMEN WO CONTRAST  Result Date: 07/04/2019 CLINICAL DATA:  Multiple episodes of flushing, diaphoresis, hypertension and altered level of consciousness. Evaluate for adrenal neoplasm. EXAM: CT ABDOMEN WITHOUT CONTRAST TECHNIQUE: Multidetector CT imaging of the abdomen was performed following the standard protocol without IV contrast. COMPARISON:  02/13/2013 from [facility]: Lower chest: No acute findings. Hepatobiliary: No masses visualized on this unenhanced exam. Tiny calcified gallstone is noted, however there is no  evidence of cholecystitis or biliary ductal dilatation. Pancreas: No mass or inflammatory process visualized on this unenhanced exam. Spleen:  Within normal limits in size. Adrenals/Urinary tract: Normal appearance of both adrenal glands. No evidence of adrenal or other retroperitoneal mass. 4 cm fluid attenuation cyst noted in upper pole of left kidney. No evidence of nephrolithiasis or hydronephrosis. Stomach/Bowel: Visualized portion unremarkable. Vascular/Lymphatic: No pathologically enlarged lymph nodes identified. No evidence of abdominal aortic aneurysm. Aortic atherosclerosis noted. Other:  None. Musculoskeletal:  No suspicious bone lesions identified. IMPRESSION: 1. No evidence of adrenal mass or other abdominal neoplasm.1 2. Cholelithiasis. No radiographic evidence of cholecystitis. Aortic Atherosclerosis (ICD10-I70.0). Electronically Signed   By: Marlaine Hind M.D.   On: 07/04/2019 12:18   CT Head Wo Contrast  Result Date: 07/03/2019 CLINICAL DATA:  Mental status change, persistent or worsening. Additional history provided: Patient disoriented, unsure of ear and president, EXAM: CT HEAD WITHOUT CONTRAST TECHNIQUE: Contiguous axial images were obtained from the base of the skull through the vertex without intravenous contrast. COMPARISON:  Prior head CT 03/09/2019, brain MRI 03/08/2019 FINDINGS: Brain: Stable, mild generalized parenchymal atrophy. There is small volume acute subarachnoid hemorrhage overlying the mid to posterior right frontal lobe (for instance as seen on series 2, image 23) (series 5, image 16). Stable moderate patchy hypodensity within the cerebral white matter which is nonspecific, but consistent with chronic small vessel ischemic disease. No demarcated cortical infarct  is identified. No evidence of intracranial mass. No midline shift. Vascular: No hyperdense vessel.  Atherosclerotic calcifications Skull: Chronic left occipital calvarial fracture. No acute calvarial fracture is  identified. Sinuses/Orbits: Visualized orbits show no acute finding. Trace right sphenoid sinus mucosal thickening. No significant mastoid effusion. These results were called by telephone at the time of interpretation on 07/03/2019 at 5:12 pm to provider Dr. Roderic Palau, who verbally acknowledged these results. IMPRESSION: Small volume acute subarachnoid hemorrhage overlying the mid to posterior right frontal lobe. Stable mild generalized parenchymal atrophy and moderate chronic small vessel ischemic disease. Redemonstrated chronic left occipital calvarial fracture. Minimal paranasal sinus mucosal thickening. Electronically Signed   By: Kellie Simmering DO   On: 07/03/2019 17:12       Subjective:  Patient seen and examined at the bedside this morning.  Hemodynamically stable for discharge today.  Discharge Exam: Vitals:   07/04/19 2013 07/05/19 0554  BP: 131/68 106/60  Pulse: 65 (!) 59  Resp: 16 16  Temp: 98.6 F (37 C) 98 F (36.7 C)  SpO2: 98% 98%   Vitals:   07/04/19 0848 07/04/19 1353 07/04/19 2013 07/05/19 0554  BP: 127/60 135/63 131/68 106/60  Pulse: 62 72 65 (!) 59  Resp: 14 12 16 16   Temp: 98.3 F (36.8 C) 98.8 F (37.1 C) 98.6 F (37 C) 98 F (36.7 C)  TempSrc: Oral Oral    SpO2: 97% 97% 98% 98%  Weight:      Height:        General: Pt is alert, awake, not in acute distress Cardiovascular: RRR, S1/S2 +, no rubs, no gallops Respiratory: CTA bilaterally, no wheezing, no rhonchi Abdominal: Soft, NT, ND, bowel sounds + Extremities: no edema, no cyanosis    The results of significant diagnostics from this hospitalization (including imaging, microbiology, ancillary and laboratory) are listed below for reference.     Microbiology: Recent Results (from the past 240 hour(s))  SARS CORONAVIRUS 2 (TAT 6-24 HRS) Nasopharyngeal Nasopharyngeal Swab     Status: None   Collection Time: 07/03/19 11:19 PM   Specimen: Nasopharyngeal Swab  Result Value Ref Range Status   SARS  Coronavirus 2 NEGATIVE NEGATIVE Final    Comment: (NOTE) SARS-CoV-2 target nucleic acids are NOT DETECTED.  The SARS-CoV-2 RNA is generally detectable in upper and lower respiratory specimens during the acute phase of infection. Negative results do not preclude SARS-CoV-2 infection, do not rule out co-infections with other pathogens, and should not be used as the sole basis for treatment or other patient management decisions. Negative results must be combined with clinical observations, patient history, and epidemiological information. The expected result is Negative.  Fact Sheet for Patients: SugarRoll.be  Fact Sheet for Healthcare Providers: https://www.woods-mathews.com/  This test is not yet approved or cleared by the [place] FDA and  has been authorized for detection and/or diagnosis of SARS-CoV-2 by FDA under an Emergency Use Authorization (EUA). This EUA will remain  in effect (meaning this test can be used) for the duration of the COVID-19 declaration under Se ction 564(b)(1) of the Act, 21 U.S.C. section 360bbb-3(b)(1), unless the authorization is terminated or revoked sooner.  Performed at [facility], [place] [address], [place], [place] [address]      Labs: BNP (last 3 results) No results for input(s): BNP in the last 8760 hours. Basic Metabolic Panel: Recent Labs  Lab 07/03/19 1542  NA 140  K 3.9  CL 103  CO2 28  GLUCOSE 118*  BUN 12  CREATININE 0.73  CALCIUM 9.4   Liver Function Tests: Recent Labs  Lab 07/03/19 1542  AST 21  ALT 16  ALKPHOS 114  BILITOT 0.6  PROT 8.0  ALBUMIN 4.6   No results for input(s): LIPASE, AMYLASE in the last 168 hours. No results for input(s): AMMONIA in the last 168 hours. CBC: Recent Labs  Lab 07/03/19 1542  WBC 8.1  NEUTROABS 6.3  HGB 13.8  HCT 43.3  MCV 95.2  PLT 234   Cardiac Enzymes: No results for input(s): CKTOTAL, CKMB, CKMBINDEX, TROPONINI in the  last 168 hours. BNP: Invalid input(s): POCBNP CBG: Recent Labs  Lab 07/03/19 1517  GLUCAP 100*   D-Dimer No results for input(s): DDIMER in the last 72 hours. Hgb A1c No results for input(s): HGBA1C in the last 72 hours. Lipid Profile No results for input(s): CHOL, HDL, LDLCALC, TRIG, CHOLHDL, LDLDIRECT in the last 72 hours. Thyroid function studies No results for input(s): TSH, T4TOTAL, T3FREE, THYROIDAB in the last 72 hours.  Invalid input(s): FREET3 Anemia work up No results for input(s): VITAMINB12, FOLATE, FERRITIN, TIBC, IRON, RETICCTPCT in the last 72 hours. Urinalysis    Component Value Date/Time   COLORURINE YELLOW 07/03/2019 1809   APPEARANCEUR CLEAR 07/03/2019 1809   LABSPEC 1.009 07/03/2019 1809   PHURINE 8.0 07/03/2019 1809   GLUCOSEU NEGATIVE 07/03/2019 1809   HGBUR NEGATIVE 07/03/2019 1809   BILIRUBINUR NEGATIVE 07/03/2019 1809   KETONESUR 5 (A) 07/03/2019 1809   PROTEINUR NEGATIVE 07/03/2019 1809   NITRITE NEGATIVE 07/03/2019 1809   LEUKOCYTESUR SMALL (A) 07/03/2019 1809   Sepsis Labs Invalid input(s): PROCALCITONIN,  WBC,  LACTICIDVEN Microbiology Recent Results (from the past 240 hour(s))  SARS CORONAVIRUS 2 (TAT 6-24 HRS) Nasopharyngeal Nasopharyngeal Swab     Status: None   Collection Time: 07/03/19 11:19 PM   Specimen: Nasopharyngeal Swab  Result Value Ref Range Status   SARS Coronavirus 2 NEGATIVE NEGATIVE Final    Comment: (NOTE) SARS-CoV-2 target nucleic acids are NOT DETECTED.  The SARS-CoV-2 RNA is generally detectable in upper and lower respiratory specimens during the acute phase of infection. Negative results do not preclude SARS-CoV-2 infection, do not rule out co-infections with other pathogens, and should not be used as the sole basis for treatment or other patient management decisions. Negative results must be combined with clinical observations, patient history, and epidemiological information. The expected result is  Negative.  Fact Sheet for Patients: SugarRoll.be  Fact Sheet for Healthcare Providers: https://www.woods-mathews.com/  This test is not yet approved or cleared by the [place] FDA and  has been authorized for detection and/or diagnosis of SARS-CoV-2 by FDA under an Emergency Use Authorization (EUA). This EUA will remain  in effect (meaning this test can be used) for the duration of the COVID-19 declaration under Se ction 564(b)(1) of the Act, 21 U.S.C. section 360bbb-3(b)(1), unless the authorization is terminated or revoked sooner.  Performed at [facility], [place] [address] [place], [place] [address]     Please note: You were cared for by a hospitalist during your hospital stay. Once you are discharged, your primary care physician will handle any further medical issues. Please note that NO REFILLS for any discharge medications will be authorized once you are discharged, as it is imperative that you return to your primary care physician (or establish a relationship with a primary care physician if you do not have one) for your post hospital discharge needs so that they can reassess your need for medications and  monitor your lab values.    Time coordinating discharge: 40 minutes  SIGNED:   Shelly Coss, MD  [facility] 07/05/2019, 12:35 PM Pager [phone]  If 7PM-7AM, please contact night-coverage [email or web address] Password [email or web address]

## 2019-07-05 NOTE — Evaluation (Signed)
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 Pt found to have Acute subarachnoid hemorrhage  Clinical Impression  Patient evaluated by Physical Therapy with no further acute PT needs identified. All education has been completed and the patient has no further questions.  Pt mobilizing well in hallway with no physical assist.  Pt lives with spouse and daughter.  No further follow-up Physical Therapy or equipment needs identified. PT is signing off. Thank you for this referral.     Follow Up Recommendations No PT follow up    Equipment Recommendations  None recommended by PT    Recommendations for Other Services       Precautions / Restrictions Precautions Precautions: None      Mobility  Bed Mobility Overal bed mobility: Modified Independent;Needs Assistance Bed Mobility: Supine to Sit     Supine to sit: Supervision;HOB elevated        Transfers Overall transfer level: Needs assistance Equipment used: None Transfers: Sit to/from Stand Sit to Stand: Supervision            Ambulation/Gait Ambulation/Gait assistance: Supervision Gait Distance (Feet): 400 Feet Assistive device: None Gait Pattern/deviations: WFL(Within Functional Limits)     General Gait Details: no symptoms reported, no unsteadiness or LOB observed  Stairs            Wheelchair Mobility    Modified Rankin (Stroke Patients Only)       Balance                                              Pertinent Vitals/Pain Pain Assessment: No/denies pain    Home Living Family/patient expects to be discharged to:: Private residence Living Arrangements: Spouse/significant other;Children Available Help at Discharge: Family;Available 24 hours/day Type of Home: House Home Access: Stairs to enter Entrance Stairs-Rails: Psychiatric nurse of Steps: 4 Home Layout: One level Home Equipment: None      Prior Function Level of Independence: Needs assistance   Gait / Transfers Assistance Needed: independent, however has fallen on steps previously so daughter likes for pt to hold onto someone, plus railing (per admission 3 months ago)  ADL's / Homemaking Assistance Needed: very seldom drives; daughter sets up pill box        Hand Dominance        Extremity/Trunk Assessment        Lower Extremity Assessment Lower Extremity Assessment: Overall WFL for tasks assessed    Cervical / Trunk Assessment Cervical / Trunk Assessment: Normal  Communication   Communication: HOH;Expressive difficulties  Cognition Arousal/Alertness: Awake/alert Behavior During Therapy: WFL for tasks assessed/performed                                   General Comments: pt very HOH, appropriate responses however delayed      General Comments      Exercises     Assessment/Plan    PT Assessment  Patent does not need any further PT services  PT Problem List Decreased strength;Decreased mobility;Decreased knowledge of use of DME;Decreased balance;Decreased activity tolerance       PT Treatment Interventions      PT Goals (Current goals can be found in the Care Plan section)  Acute Rehab PT Goals PT Goal Formulation: All assessment and education complete, DC therapy    Frequency     Barriers to discharge        Co-evaluation               AM-PAC PT "6 Clicks" Mobility  Outcome Measure Help  needed turning from your back to your side while in a flat bed without using bedrails?: None Help needed moving from lying on your back to sitting on the side of a flat bed without using bedrails?: None Help needed moving to and from a bed to a chair (including a wheelchair)?: None Help needed standing up from a chair using your arms (e.g., wheelchair or bedside chair)?: None Help needed to walk in hospital room?: None Help needed climbing 3-5 steps with a railing? : None 6 Click Score: 24    End of Session Equipment Utilized During Treatment: Gait belt Activity Tolerance: Patient tolerated treatment well Patient left: in chair;with call bell/phone within reach   PT Visit Diagnosis: Difficulty in walking, not elsewhere classified (R26.2)    Time: 1010-1020 PT Time Calculation (min) (ACUTE ONLY): 10 min   Charges:   PT Evaluation $PT Eval Low Complexity: 1 Low        Kati PT, DPT Acute Rehabilitation Services Pager: [phone] Office: [phone]  Javeria Briski,KATHrine E 07/05/2019, 11:02 AM

## 2019-07-11 LAB — METANEPHRINES, URINE, 24 HOUR
Metaneph Total, Ur: 120 ug/L
Metanephrines, 24H Ur: 42 ug/24 hr (ref 36–209)
Normetanephrine, 24H Ur: 238 ug/24 hr (ref 131–612)
Normetanephrine, Ur: 681 ug/L
Total Volume: 350

## 2019-07-14 LAB — CATECHOLAMINES,UR.,FREE,24 HR
Dopamine, Ur, 24Hr: UNDETERMINED ug/24 hr
Epinephrine, U, 24Hr: UNDETERMINED ug/24 hr
Norepinephrine,U,24H: UNDETERMINED ug/24 hr
Total Volume: 350

## 2019-09-13 ENCOUNTER — Emergency Department (HOSPITAL_COMMUNITY): Payer: Medicare Other

## 2019-09-13 ENCOUNTER — Emergency Department (HOSPITAL_COMMUNITY)
Admission: EM | Admit: 2019-09-13 | Discharge: 2019-09-13 | Disposition: A | Discharge status: Home / Self Care | Payer: Medicare Other | Attending: Emergency Medicine | Admitting: Emergency Medicine

## 2019-09-13 DIAGNOSIS — Z20822 Contact with and (suspected) exposure to covid-19: Secondary | ICD-10-CM | POA: Diagnosis not present

## 2019-09-13 DIAGNOSIS — I10 Essential (primary) hypertension: Secondary | ICD-10-CM | POA: Insufficient documentation

## 2019-09-13 DIAGNOSIS — Z87891 Personal history of nicotine dependence: Secondary | ICD-10-CM | POA: Insufficient documentation

## 2019-09-13 DIAGNOSIS — Z79899 Other long term (current) drug therapy: Secondary | ICD-10-CM | POA: Insufficient documentation

## 2019-09-13 DIAGNOSIS — R791 Abnormal coagulation profile: Secondary | ICD-10-CM | POA: Diagnosis not present

## 2019-09-13 DIAGNOSIS — R519 Headache, unspecified: Secondary | ICD-10-CM | POA: Insufficient documentation

## 2019-09-13 LAB — CBC
HCT: 42.4 % (ref 36.0–46.0)
Hemoglobin: 13.7 g/dL (ref 12.0–15.0)
MCH: 29.8 pg (ref 26.0–34.0)
MCHC: 32.3 g/dL (ref 30.0–36.0)
MCV: 92.4 fL (ref 80.0–100.0)
Platelets: 269 10*3/uL (ref 150–400)
RBC: 4.59 MIL/uL (ref 3.87–5.11)
RDW: 14.2 % (ref 11.5–15.5)
WBC: 9.4 10*3/uL (ref 4.0–10.5)
nRBC: 0 % (ref 0.0–0.2)

## 2019-09-13 LAB — BASIC METABOLIC PANEL
Anion gap: 11 (ref 5–15)
BUN: 12 mg/dL (ref 8–23)
CO2: 25 mmol/L (ref 22–32)
Calcium: 9.3 mg/dL (ref 8.9–10.3)
Chloride: 104 mmol/L (ref 98–111)
Creatinine, Ser: 0.73 mg/dL (ref 0.44–1.00)
GFR calc Af Amer: >60 mL/min (ref >60)
GFR calc non Af Amer: >60 mL/min (ref >60)
Glucose, Bld: 106 mg/dL — ABNORMAL HIGH (ref 70–99)
Potassium: 3.9 mmol/L (ref 3.5–5.1)
Sodium: 140 mmol/L (ref 135–145)

## 2019-09-13 LAB — URINALYSIS, COMPLETE (UACMP) WITH MICROSCOPIC
Bilirubin Urine: NEGATIVE
Glucose, UA: NEGATIVE mg/dL
Hgb urine dipstick: NEGATIVE
Ketones, ur: 5 mg/dL — AB
Nitrite: NEGATIVE
Protein, ur: NEGATIVE mg/dL
Specific Gravity, Urine: 1.015 (ref 1.005–1.030)
pH: 6 (ref 5.0–8.0)

## 2019-09-13 LAB — HEPATIC FUNCTION PANEL
ALT: 16 U/L (ref 0–44)
AST: 19 U/L (ref 15–41)
Albumin: 3.7 g/dL (ref 3.5–5.0)
Alkaline Phosphatase: 93 U/L (ref 38–126)
Bilirubin, Direct: <0.1 mg/dL (ref 0.0–0.2)
Total Bilirubin: 0.6 mg/dL (ref 0.3–1.2)
Total Protein: 6.5 g/dL (ref 6.5–8.1)

## 2019-09-13 LAB — RAPID URINE DRUG SCREEN, HOSP PERFORMED
Amphetamines: NOT DETECTED
Barbiturates: POSITIVE — AB
Benzodiazepines: NOT DETECTED
Cocaine: NOT DETECTED
Opiates: POSITIVE — AB
Tetrahydrocannabinol: POSITIVE — AB

## 2019-09-13 LAB — PROTIME-INR
INR: 1 (ref 0.8–1.2)
Prothrombin Time: 12.6 seconds (ref 11.4–15.2)

## 2019-09-13 LAB — AMMONIA: Ammonia: 46 umol/L — ABNORMAL HIGH (ref 9–35)

## 2019-09-13 LAB — ETHANOL: Alcohol, Ethyl (B): <10 mg/dL (ref <10)

## 2019-09-13 LAB — SARS CORONAVIRUS 2 BY RT PCR (HOSPITAL ORDER, PERFORMED IN ~~LOC~~ HOSPITAL LAB): SARS Coronavirus 2: NEGATIVE

## 2019-09-13 MED ORDER — PROCHLORPERAZINE EDISYLATE 10 MG/2ML IJ SOLN
5.0000 mg | Freq: Once | INTRAMUSCULAR | Status: AC
  Administered 2019-09-13: 5 mg via INTRAVENOUS
  Filled 2019-09-13: qty 2

## 2019-09-13 MED ORDER — MECLIZINE HCL 25 MG PO TABS
25.0000 mg | ORAL_TABLET | Freq: Once | ORAL | Status: AC
  Administered 2019-09-13: 25 mg via ORAL
  Filled 2019-09-13: qty 1

## 2019-09-13 MED ORDER — MORPHINE SULFATE (PF) 4 MG/ML IV SOLN
4.0000 mg | Freq: Once | INTRAVENOUS | Status: AC
  Administered 2019-09-13: 4 mg via INTRAVENOUS
  Filled 2019-09-13: qty 1

## 2019-09-13 MED ORDER — ONDANSETRON HCL 4 MG/2ML IJ SOLN
4.0000 mg | Freq: Once | INTRAMUSCULAR | Status: AC
  Administered 2019-09-13: 4 mg via INTRAVENOUS
  Filled 2019-09-13: qty 2

## 2019-09-13 NOTE — ED Triage Notes (Signed)
Pt arrived from Home [facility]. Pt has been experiencing a headache for the past 3 days. Husband states told EMS tshe seems to be less oriented than normal and having trouble getting out of bed.No Fall, No Blood thinners. Pt complains of some dizziness.

## 2019-09-13 NOTE — ED Provider Notes (Signed)
[facility] EMERGENCY DEPARTMENT Provider Note   CSN: [id number] Arrival date & time: 09/13/19  0901     History Chief Complaint  Patient presents with  . Migraine    Shelly Farrell is a 69 y.o. female has had a "really bad headache" progressively worsening for several days.  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  She has had 3 days of headache.  She rates the pain at 10/10.  She states this is the worst headache she is ever had. Patient is confused and is thus a poor historian, there is a level 5 caveat. She is coughing, she denies fever, cp, or sob.Patient is also [facility] which makes the hx difficult. Additional history gathered from the patient's husband by phone.  He states that she normally has baseline confusion and has difficulty remembering the date and the year difficulty with spelling some simple words she is to be able to spell.  He says that this headache was worse than her normal headaches.  She is essentially in the bed and he did notice that over the past 3 days her balance was really poor she seemed to be falling toward the left and had to hold onto things to walk which is a new issue.  He also notices that her baseline confusion is much worse than normal.  He states that she normally has some difficulty finding words and seems to be slow to respond at times because she has to think hard but that also seemed worse than normal.  HPI     Past Medical History:  Diagnosis Date  . Anxiety   . Arthritis   . Depression   . GERD (gastroesophageal reflux disease)   . Hyperlipidemia   . Hypertension   . Neuromuscular disorder [facility])     Patient Active Problem List   Diagnosis Date Noted  . Confusion 07/03/2019  . Cerebral amyloid angiopathy ([place]) 03/11/2019  . Cognitive decline 03/11/2019  .  Hypokalemia 03/11/2019  . Expressive aphasia 03/11/2019  . 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 ACE inhibitor-aggravated angioedema, initial encounter 07/26/2018  . ACE inhibitor-aggravated angioedema 07/26/2018  . Depression   . Hypertension   . GERD (gastroesophageal reflux disease)   . Hyperlipidemia   . Skull fracture with cerebral contusion, closed, initial encounter ([place]) 03/02/2017    Past Surgical History:  Procedure Laterality Date  . ABDOMINAL HYSTERECTOMY    . TUBAL LIGATION       OB History   No obstetric history on file.     Family History  Problem Relation Age of Onset  . Cancer Father   . Hepatitis C Daughter     Social History   Tobacco Use  . Smoking status: Former Research scientist (life sciences)  . Smokeless tobacco: Never Used  Vaping Use  . Vaping Use: Never used  Substance Use Topics  . Alcohol use: No    Alcohol/week: 0.0 standard drinks  . Drug use: No    Home Medications Prior to Admission medications   Medication Sig Start Date End Date Taking? Authorizing Provider  amLODipine (NORVASC) 10 MG tablet Take 10 mg by mouth daily. 06/11/19  Yes [provider]  ascorbic Acid (VITAMIN C) 500 MG CPCR Take 1 capsule by mouth daily.   Yes [provider]  busPIRone (BUSPAR) 5 MG tablet Take  5 mg by mouth 2 (two) times daily as needed for anxiety. 03/20/19  Yes [provider]  butalbital-acetaminophen-caffeine (FIORICET) 50-325-40 MG tablet Take 1 tablet by mouth every 6 (six) hours as needed for headache. 03/11/19  Yes Donzetta Starch, NP  cholecalciferol (VITAMIN D) 1000 units tablet Take 1,000 Units by mouth daily.   Yes [provider]  DULoxetine (CYMBALTA) 30 MG capsule Take 30 mg by mouth daily. 06/17/19  Yes [provider]  DULoxetine (CYMBALTA) 60 MG capsule Take 60 mg by mouth daily.  02/14/18  Yes [provider]  losartan (COZAAR) 50 MG tablet Take 50 mg by mouth daily. 02/28/19   Yes [provider]  meclizine (ANTIVERT) 25 MG tablet Take 25 mg by mouth 2 (two) times daily as needed for dizziness. 11/23/18  Yes [provider]  memantine (NAMENDA) 10 MG tablet Take 10 mg by mouth 2 (two) times daily. 05/21/19  Yes [provider]  Multiple Vitamin (MULTIVITAMIN WITH MINERALS) TABS tablet Take 1 tablet by mouth daily.   Yes [provider]  omeprazole (PRILOSEC) 20 MG capsule Take 20 mg by mouth daily.   Yes [provider]  ondansetron (ZOFRAN) 4 MG tablet Take 4 mg by mouth every 8 (eight) hours as needed for nausea/vomiting. 09/10/19  Yes [provider]  propranolol (INDERAL) 10 MG tablet Take 10 mg by mouth 2 (two) times daily. 02/25/19  Yes [provider]  simvastatin (ZOCOR) 40 MG tablet Take 1 tablet (40 mg total) by mouth at bedtime. 03/11/19  Yes Donzetta Starch, NP    Allergies    Lisinopril and Donepezil  Review of Systems   Review of Systems Ten systems reviewed and are negative for acute change, except as noted in the HPI.   Physical Exam Updated Vital Signs BP 121/61   Pulse 65   Temp 98.4 F (36.9 C) (Oral)   Resp 16   Ht 5\' 1"  (1.549 m)   Wt 52.2 kg   SpO2 95%   BMI 21.73 kg/m   Physical Exam Vitals and nursing note reviewed.  Constitutional:      General: She is not in acute distress.    Appearance: She is well-developed. She is not diaphoretic.  HENT:     Head: Normocephalic and atraumatic.  Eyes:     General: No scleral icterus.    Conjunctiva/sclera: Conjunctivae normal.     Pupils: Pupils are equal, round, and reactive to light.     Comments: No horizontal, vertical or rotational nystagmus  Neck:     Comments: Full active and passive ROM without pain No midline or paraspinal tenderness No nuchal rigidity or meningeal signs Cardiovascular:     Rate and Rhythm: Normal rate and regular rhythm.  Pulmonary:     Effort: Pulmonary effort is normal. No respiratory distress.       Breath sounds: Normal breath sounds. No wheezing or rales.  Abdominal:     General: Bowel sounds are normal.     Palpations: Abdomen is soft.     Tenderness: There is no abdominal tenderness. There is no guarding or rebound.  Musculoskeletal:        General: Normal range of motion.     Cervical back: Normal range of motion and neck supple.  Lymphadenopathy:     Cervical: No cervical adenopathy.  Skin:    General: Skin is warm and dry.     Findings: No rash.  Neurological:     Mental  Status: She is alert and oriented to person, place, and time.     Cranial Nerves: No cranial nerve deficit.     Motor: No abnormal muscle tone.     Coordination: Coordination normal.     Comments: Mental Status:  Alert, oriented, thought content appropriate. Speech fluent without evidence of aphasia. Able to follow 2 step commands without difficulty.  Cranial Nerves:  II:  Peripheral visual fields grossly normal, pupils equal, round, reactive to light III,IV, VI: ptosis not present, extra-ocular motions intact bilaterally  V,VII: smile symmetric, facial light touch sensation equal VIII: hearing grossly normal bilaterally  IX,X: midline uvula rise  XI: bilateral shoulder shrug equal and strong XII: midline tongue extension  Motor:  5/5 in upper and lower extremities bilaterally including strong and equal grip strength and dorsiflexion/plantar flexion Sensory: Pinprick and light touch normal in all extremities.  Cerebellar: normal finger-to-nose with bilateral upper extremities CV: distal pulses palpable throughout   Psychiatric:        Behavior: Behavior normal.        Thought Content: Thought content normal.        Judgment: Judgment normal.     ED Results / Procedures / Treatments   Labs (all labs ordered are listed, but only abnormal results are displayed) Labs Reviewed  BASIC METABOLIC PANEL - Abnormal; Notable for the following components:      Result Value   Glucose, Bld 106 (*)     All other components within normal limits  URINALYSIS, COMPLETE (UACMP) WITH MICROSCOPIC - Abnormal; Notable for the following components:   APPearance HAZY (*)    Ketones, ur 5 (*)    Leukocytes,Ua MODERATE (*)    Bacteria, UA RARE (*)    All other components within normal limits  AMMONIA - Abnormal; Notable for the following components:   Ammonia 46 (*)    All other components within normal limits  RAPID URINE DRUG SCREEN, HOSP PERFORMED - Abnormal; Notable for the following components:   Opiates POSITIVE (*)    Tetrahydrocannabinol POSITIVE (*)    Barbiturates POSITIVE (*)    All other components within normal limits  SARS CORONAVIRUS 2 BY RT PCR (HOSPITAL ORDER, [place] [facility])  CBC  PROTIME-INR  ETHANOL  HEPATIC FUNCTION PANEL    EKG EKG Interpretation  Date/Time:  Friday September 13 2019 09:19:21 EDT Ventricular Rate:  68 PR Interval:    QRS Duration: 88 QT Interval:  427 QTC Calculation: 455 R Axis:   56 Text Interpretation: Sinus rhythm No STEMI Confirmed by Octaviano Glow [phone]) on 09/13/2019 9:25:04 AM   Radiology CT HEAD WO CONTRAST  Result Date: 09/13/2019 CLINICAL DATA:  Headache EXAM: CT HEAD WITHOUT CONTRAST TECHNIQUE: Contiguous axial images were obtained from the base of the skull through the vertex without intravenous contrast. COMPARISON:  July 03, 2019 FINDINGS: Brain: Ventricles and sulci appear within normal limits for age. A small focus of previously noted subarachnoid hemorrhage in the right frontal region is no longer evident. No hemorrhage is evident currently. There is no appreciable mass, extra-axial fluid collection, or midline shift. Patchy small vessel disease in the centra semiovale bilaterally appears stable. No acute infarct is demonstrated on this current examination. Vascular: No hyperdense vessel. There is calcification right carotid siphon region as well as more subtly in the left distal vertebral artery. Skull:  There is an old healed fracture of the left bony calvarium otherwise intact and unchanged. Occipital bone, nondisplaced. Sinuses/Orbits: Mucosal thickening noted in  multiple ethmoid air cells. Mucosal thickening noted in the anterior right sphenoid sinus. Visualized orbits appear symmetric bilaterally. Other: Mastoid air cells are clear. IMPRESSION: 1. Patchy periventricular small vessel disease is stable. No acute infarct. Previous subarachnoid hemorrhage has resolved. Currently no hemorrhage or mass evident. 2. Stable left occipital bone fracture, without depression. Bony calvarium otherwise unremarkable. 3.  Foci of paranasal sinus disease. 4.  Foci of arterial vascular calcification noted. Electronically Signed   By: Lowella Grip III M.D.   On: 09/13/2019 09:45   MR BRAIN WO CONTRAST  Result Date: 09/13/2019 CLINICAL DATA:  Headache, prior subarachnoid hemorrhage EXAM: MRI HEAD WITHOUT CONTRAST TECHNIQUE: Multiplanar, multiecho pulse sequences of the brain and surrounding structures were obtained without intravenous contrast. COMPARISON:  03/08/2019 FINDINGS: Brain: There is no acute infarction or intracranial hemorrhage. Multifocal sulcal susceptibility is noted bilaterally particularly involving the frontoparietal lobes reflecting sequelae of prior subarachnoid hemorrhage. There is no intracranial mass, mass effect, or edema. There is no hydrocephalus or extra-axial fluid collection. Ventricles are stable in size. Patchy T2 hyperintensity in the supratentorial white matter is nonspecific but may reflect stable chronic microvascular ischemic changes. 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 Vascular: Major vessel flow voids at the skull base are preserved. Skull and upper cervical spine: Normal marrow signal is preserved. Sinuses/Orbits: Trace mucosal thickening.  Orbits are unremarkable. Other: Sella is unremarkable.  Mastoid air cells are clear. IMPRESSION: No evidence of recent infarction,  hemorrhage, or mass. Stable chronic microvascular ischemic changes. Evidence of prior subarachnoid hemorrhage. Electronically Signed   By: Macy Mis M.D.   On: 09/13/2019 12:58   DG Chest Port 1 View  Result Date: 09/13/2019 CLINICAL DATA:  Altered mental status EXAM: PORTABLE CHEST 1 VIEW COMPARISON:  03/06/2017 chest radiograph and prior. FINDINGS: No focal consolidation, pneumothorax or pleural effusion. Cardiomegaly. No acute osseous abnormality. Multilevel spondylosis. IMPRESSION: No acute airspace disease. Cardiomegaly. Electronically Signed   By: Primitivo Gauze M.D.   On: 09/13/2019 10:16    Procedures Procedures (including critical care time)  Medications Ordered in ED Medications  morphine 4 MG/ML injection 4 mg (4 mg Intravenous Given 09/13/19 1035)  ondansetron (ZOFRAN) injection 4 mg (4 mg Intravenous Given 09/13/19 1034)  meclizine (ANTIVERT) tablet 25 mg (25 mg Oral Given 09/13/19 1324)  prochlorperazine (COMPAZINE) injection 5 mg (5 mg Intravenous Given 09/13/19 1402)    ED Course  I have reviewed the triage vital signs and the nursing notes.  Pertinent labs & imaging results that were available during my care of the patient were reviewed by me and considered in my medical decision making (see chart for details).  Clinical Course as of Sep 14 814  Fri Sep 13, 2019  1347 69 yo female w/ hx of SAH two months ago presenting to ED with dizziness and headache.  She reports onset of headache 2-3 days ago while at rest.  Throbbing and generalized with nausea, also reporting some dizziness and balance problems.  PA provider spoke to her husband who confirms the patient has some expressive aphasia and emotional lability at baseline, with no new changes.  On exam patient does have some mild expressive aphasia, PERRL, no sensory deficits or objective weakness on exam, no meningusmus.  She is afebrile and has no leukocytosis.  I doubt this is meningitis.  She had CTH and MR brain  without evidence of recurrent bleed.  Pa provider spoke with our neurologist who felt an MRI of the brain > 24 hours after onset  of headache was sufficiently sensitive for a brain bleed.     [MT]  0459 Pt's HA improved with morphine.  She is now being given compazine and meclizine and we will ambulate her and touch base with her family again.  If there is improvement she can be discahrged home.  Of note her UDS was positive for barbituates, possibly 2/2 fiorecet, and I wonder about polypharmacy contributing to these symptoms.   [MT]  9774 With fever, neck stiffness, photophobia, or worsening HA, I doubt encephalitis, meningitis or sepsis.   [MT]  1523 Stable walking, HA improved, okay for discharge, husband is coming to pick her up   [MT]    Clinical Course User Index [MT] Trifan, Carola Rhine, MD   MDM Rules/Calculators/A&P                          Shelly Farrell presents with headache Given the large differential diagnosis for [facility], the decision making in this case is of high complexity.    After evaluating all of the data points in this case, the presentation of Tempie Gibeault is NOT consistent with skull fracture, meningitis/encephalitis, SAH/sentinel bleed, Intracranial Hemorrhage (ICH) (subdural/epidural), acute obstructive hydrocephalus, space occupying lesions, CVA, CO Poisoning, Basilar/vertebral artery dissection, preeclampsia, cerebral venous thrombosis, hypertensive emergency, temporal Arteritis, Idiopathic Intracranial Hypertension (pseudotumor cerebri).  I ordered interpreted and reviewed labs which include urinalysis which shows no evidence of infection.  UDS is positive for barbiturates, THC and opiates.  I gave the patient opiates here.  She has been taking Fioricet at home and is a regular marijuana and tobacco smoker. Patient has a normal hepatic function panel with a mildly elevated ammonia level although I have low suspicion that this is the  cause of her increased confusion.  BMP without significant abnormality, CBC, PT/INR, ethanol level within normal limits.  Covid test is negative.  I ordered, interpreted and reviewed the images of a portable 1 view chest x-ray, head CT and MR brain. There are no acute findings on any of the imaging today. I discussed appropriate imaging modalities with Dr. Amie Portland, given the length of time the patient has had the headache, no need For LP to look for occult SAH. Patient is back to baseline MS and Ambulatory without ataxia or assistance. Headache is fully resolved.  Strict return and follow-up precautions have been given by me personally or by detailed written instructions verbalized by nursing staff using the teach back method to patient/family/caregiver.  Data Reviewed/Counseling: I have reviewed the patient's vital signs, nursing notes, and other relevant tests/information. I had a detailed discussion regarding the historical points, exam findings, and any diagnostic results supporting the discharge diagnosis. I also discussed the need for outpatient follow-up and the need to return to the ED if symptoms worsen or if there are any questions or concerns that arise at [facility]  Final Clinical Impression(s) / ED Diagnoses Final diagnoses:  Bad headache    Rx / DC Orders ED Discharge Orders    None       Margarita Mail, PA-C 09/14/19 0816    Wyvonnia Dusky, MD 09/14/19 [phone]

## 2019-09-13 NOTE — ED Notes (Signed)
Pt Accidentally pulled out IV. IV was intact and good. Still hanging on by Tape

## 2019-09-13 NOTE — Discharge Instructions (Signed)

## 2020-01-29 ENCOUNTER — Emergency Department (HOSPITAL_COMMUNITY)
Admission: EM | Admit: 2020-01-29 | Discharge: 2020-01-30 | Disposition: A | Discharge status: Home / Self Care | Payer: Medicare Other | Attending: Emergency Medicine | Admitting: Emergency Medicine

## 2020-01-29 DIAGNOSIS — Z79899 Other long term (current) drug therapy: Secondary | ICD-10-CM | POA: Insufficient documentation

## 2020-01-29 DIAGNOSIS — Z20822 Contact with and (suspected) exposure to covid-19: Secondary | ICD-10-CM | POA: Insufficient documentation

## 2020-01-29 DIAGNOSIS — R1084 Generalized abdominal pain: Secondary | ICD-10-CM | POA: Diagnosis not present

## 2020-01-29 DIAGNOSIS — R197 Diarrhea, unspecified: Secondary | ICD-10-CM | POA: Diagnosis not present

## 2020-01-29 DIAGNOSIS — Z87891 Personal history of nicotine dependence: Secondary | ICD-10-CM | POA: Insufficient documentation

## 2020-01-29 DIAGNOSIS — I10 Essential (primary) hypertension: Secondary | ICD-10-CM | POA: Insufficient documentation

## 2020-01-29 DIAGNOSIS — K219 Gastro-esophageal reflux disease without esophagitis: Secondary | ICD-10-CM | POA: Diagnosis not present

## 2020-01-29 DIAGNOSIS — R109 Unspecified abdominal pain: Secondary | ICD-10-CM

## 2020-01-29 LAB — COMPREHENSIVE METABOLIC PANEL
ALT: 13 U/L (ref 0–44)
AST: 19 U/L (ref 15–41)
Albumin: 3.8 g/dL (ref 3.5–5.0)
Alkaline Phosphatase: 99 U/L (ref 38–126)
Anion gap: 13 (ref 5–15)
BUN: 13 mg/dL (ref 8–23)
CO2: 24 mmol/L (ref 22–32)
Calcium: 9.3 mg/dL (ref 8.9–10.3)
Chloride: 102 mmol/L (ref 98–111)
Creatinine, Ser: 0.98 mg/dL (ref 0.44–1.00)
GFR, Estimated: >60 mL/min (ref >60)
Glucose, Bld: 131 mg/dL — ABNORMAL HIGH (ref 70–99)
Potassium: 3.7 mmol/L (ref 3.5–5.1)
Sodium: 139 mmol/L (ref 135–145)
Total Bilirubin: 0.8 mg/dL (ref 0.3–1.2)
Total Protein: 6.8 g/dL (ref 6.5–8.1)

## 2020-01-29 LAB — URINALYSIS, ROUTINE W REFLEX MICROSCOPIC
Bilirubin Urine: NEGATIVE
Glucose, UA: NEGATIVE mg/dL
Hgb urine dipstick: NEGATIVE
Ketones, ur: 5 mg/dL — AB
Nitrite: NEGATIVE
Protein, ur: NEGATIVE mg/dL
Specific Gravity, Urine: 1.026 (ref 1.005–1.030)
pH: 5 (ref 5.0–8.0)

## 2020-01-29 LAB — CBC
HCT: 41.5 % (ref 36.0–46.0)
Hemoglobin: 14 g/dL (ref 12.0–15.0)
MCH: 30.6 pg (ref 26.0–34.0)
MCHC: 33.7 g/dL (ref 30.0–36.0)
MCV: 90.6 fL (ref 80.0–100.0)
Platelets: 269 10*3/uL (ref 150–400)
RBC: 4.58 MIL/uL (ref 3.87–5.11)
RDW: 13.5 % (ref 11.5–15.5)
WBC: 13.9 10*3/uL — ABNORMAL HIGH (ref 4.0–10.5)
nRBC: 0 % (ref 0.0–0.2)

## 2020-01-29 LAB — LIPASE, BLOOD: Lipase: 28 U/L (ref 11–51)

## 2020-01-29 LAB — SARS CORONAVIRUS 2 BY RT PCR (HOSPITAL ORDER, PERFORMED IN ~~LOC~~ HOSPITAL LAB): SARS Coronavirus 2: NEGATIVE

## 2020-01-29 NOTE — ED Triage Notes (Signed)
Pt states that she has been having n/v and headache for 2 days, vaccinated.

## 2020-01-30 MED ORDER — LACTATED RINGERS IV BOLUS: 1000.0000 mL | Freq: Once | INTRAVENOUS | Status: DC

## 2020-01-30 MED ORDER — ONDANSETRON HCL 4 MG/2ML IJ SOLN
4.0000 mg | Freq: Once | INTRAMUSCULAR | Status: DC
  Filled 2020-01-30: qty 2

## 2020-01-30 MED ORDER — MECLIZINE HCL 25 MG PO TABS
25.0000 mg | ORAL_TABLET | Freq: Once | ORAL | Status: DC
  Filled 2020-01-30: qty 1

## 2020-01-30 NOTE — ED Provider Notes (Signed)
[facility] EMERGENCY DEPARTMENT Provider Note   CSN: [id number] Arrival date & time: 01/29/20  2101   History Chief Complaint  Patient presents with  . Emesis    Shelly Farrell is a 70 y.o. female.  The history is provided by the patient.  Emesis She has history of hypertension, hyperlipidemia, dementia and comes in because of abdominal pain for the last 3 days.  Abdominal pain is generalized.  She has vomited twice and states she has had some mild diarrhea.  She denies fever or chills or sweats.  She has also noted a spinning sensation with any kind of head movement.  She is a somewhat difficult historian.  Past Medical History:  Diagnosis Date  . Anxiety   . Arthritis   . Depression   . GERD (gastroesophageal reflux disease)   . Hyperlipidemia   . Hypertension   . Neuromuscular disorder [facility])     Patient Active Problem List   Diagnosis Date Noted  . Confusion 07/03/2019  . Cerebral amyloid angiopathy ([place]) 03/11/2019  . Cognitive decline 03/11/2019  . Hypokalemia 03/11/2019  . Expressive aphasia 03/11/2019  . 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 ACE inhibitor-aggravated angioedema, initial encounter 07/26/2018  . ACE inhibitor-aggravated angioedema 07/26/2018  . Depression   . Hypertension   . GERD (gastroesophageal reflux disease)   . Hyperlipidemia   . Skull fracture with cerebral contusion, closed, initial encounter ([place]) 03/02/2017    Past Surgical History:  Procedure Laterality Date  . ABDOMINAL HYSTERECTOMY    . TUBAL LIGATION       OB History   No obstetric history on file.     Family History  Problem Relation Age of Onset  . Cancer Father   . Hepatitis C Daughter     Social History   Tobacco Use  . Smoking status: Former Research scientist (life sciences)  . Smokeless tobacco: Never Used  Vaping Use  . Vaping Use: Never used  Substance Use Topics  . Alcohol use: No    Alcohol/week: 0.0 standard  drinks  . Drug use: No    Home Medications Prior to Admission medications   Medication Sig Start Date End Date Taking? Authorizing Provider  amLODipine (NORVASC) 10 MG tablet Take 10 mg by mouth daily. 06/11/19   [provider]  ascorbic Acid (VITAMIN C) 500 MG CPCR Take 1 capsule by mouth daily.    [provider]  busPIRone (BUSPAR) 5 MG tablet Take 5 mg by mouth 2 (two) times daily as needed for anxiety. 03/20/19   [provider]  butalbital-acetaminophen-caffeine (FIORICET) 50-325-40 MG tablet Take 1 tablet by mouth every 6 (six) hours as needed for headache. 03/11/19   Donzetta Starch, NP  cholecalciferol (VITAMIN D) 1000 units tablet Take 1,000 Units by mouth daily.    [provider]  DULoxetine (CYMBALTA) 30 MG capsule Take 30 mg by mouth daily. 06/17/19   [provider]  DULoxetine (CYMBALTA) 60 MG capsule Take 60 mg by mouth daily.  02/14/18   [provider]  losartan (COZAAR) 50 MG tablet Take 50 mg by mouth daily. 02/28/19   [provider]  meclizine (ANTIVERT) 25 MG tablet Take 25 mg by mouth 2 (two) times daily as needed for dizziness. 11/23/18   [provider]  memantine (NAMENDA) 10 MG tablet Take 10 mg by mouth 2 (two) times daily. 05/21/19   [provider]  Multiple Vitamin (MULTIVITAMIN WITH MINERALS) TABS tablet Take 1 tablet  by mouth daily.    [provider]  omeprazole (PRILOSEC) 20 MG capsule Take 20 mg by mouth daily.    [provider]  ondansetron (ZOFRAN) 4 MG tablet Take 4 mg by mouth every 8 (eight) hours as needed for nausea/vomiting. 09/10/19   [provider]  propranolol (INDERAL) 10 MG tablet Take 10 mg by mouth 2 (two) times daily. 02/25/19   [provider]  simvastatin (ZOCOR) 40 MG tablet Take 1 tablet (40 mg total) by mouth at bedtime. 03/11/19   Layne Benton, NP    Allergies    Lisinopril and Donepezil  Review of Systems   Review of  Systems  Gastrointestinal: Positive for vomiting.  All other systems reviewed and are negative.   Physical Exam Updated Vital Signs BP 134/77 (BP Location: Right Arm)   Pulse 81   Temp 99.3 F (37.4 C) (Oral)   Resp 18   SpO2 97%   Physical Exam Vitals and nursing note reviewed.   70 year old female, resting comfortably and in no acute distress. Vital signs are normal. Oxygen saturation is 97%, which is normal. Head is normocephalic and atraumatic. PERRLA, EOMI. Oropharynx is clear. Neck is nontender and supple without adenopathy or JVD. Back is nontender and there is no CVA tenderness. Lungs are clear without rales, wheezes, or rhonchi. Chest is nontender. Heart has regular rate and rhythm without murmur. Abdomen is soft, flat, with mild tenderness diffusely.  There is no rebound or guarding.  There are no masses or hepatosplenomegaly and peristalsis is normoactive. Extremities have no cyanosis or edema, full range of motion is present. Skin is warm and dry without rash. Neurologic: Mental status is normal, cranial nerves are intact, there are no motor or sensory deficits.  Vertigo is reproduced by passive head movement.  ED Results / Procedures / Treatments   Labs (all labs ordered are listed, but only abnormal results are displayed) Labs Reviewed  COMPREHENSIVE METABOLIC PANEL - Abnormal; Notable for the following components:      Result Value   Glucose, Bld 131 (*)    All other components within normal limits  CBC - Abnormal; Notable for the following components:   WBC 13.9 (*)    All other components within normal limits  URINALYSIS, ROUTINE W REFLEX MICROSCOPIC - Abnormal; Notable for the following components:   Color, Urine AMBER (*)    Ketones, ur 5 (*)    Leukocytes,Ua MODERATE (*)    Bacteria, UA RARE (*)    All other components within normal limits  SARS CORONAVIRUS 2 BY RT PCR (HOSPITAL ORDER, PERFORMED IN [place] [facility])  LIPASE, BLOOD    Procedures Procedures   Medications Ordered in ED Medications  lactated ringers bolus 1,000 mL (has no administration in time range)  ondansetron (ZOFRAN) injection 4 mg (has no administration in time range)  meclizine (ANTIVERT) tablet 25 mg (has no administration in time range)    ED Course  I have reviewed the triage vital signs and the nursing notes.  Pertinent labs & imaging results that were available during my care of the patient were reviewed by me and considered in my medical decision making (see chart for details).  MDM Rules/Calculators/A&P Generalized abdominal pain with diarrhea and nausea with some vomiting.  This consistent with viral gastroenteritis, consider diverticulitis.  Labs do show mild leukocytosis.  Will send for CT of abdomen and pelvis.  Vertigo which appears to be peripheral.  No findings that are concerning  for central vertigo.  She is given a dose of meclizine.  Old records are reviewed, and she does have a prior ED visit for abdominal pain.  Patient apparently eloped prior to getting CT scan.  Final Clinical Impression(s) / ED Diagnoses Final diagnoses:  Abdominal pain, unspecified abdominal location  Diarrhea of presumed infectious origin    Rx / DC Orders ED Discharge Orders    None       Delora Fuel, MD 63/89/37 [phone]

## 2020-01-30 NOTE — ED Notes (Addendum)
IV attempt unsuccessful x 2

## 2020-01-30 NOTE — ED Notes (Addendum)
Pt states she feels better and wants to leave. Pt advised of recommendation to receive treatment. Pt refused again and left treatment area. Agreed to sign AMA form, but one was not available at time of departure.

## 2020-11-27 ENCOUNTER — Emergency Department (HOSPITAL_COMMUNITY): Payer: Medicare Other

## 2020-11-27 ENCOUNTER — Emergency Department (HOSPITAL_COMMUNITY)
Admission: EM | Admit: 2020-11-27 | Discharge: 2020-11-27 | Disposition: A | Discharge status: Home / Self Care | Payer: Medicare Other | Attending: Emergency Medicine | Admitting: Emergency Medicine

## 2020-11-27 ENCOUNTER — Other Ambulatory Visit: Payer: Self-pay

## 2020-11-27 ENCOUNTER — Encounter (HOSPITAL_COMMUNITY): Payer: Self-pay

## 2020-11-27 DIAGNOSIS — N281 Cyst of kidney, acquired: Secondary | ICD-10-CM | POA: Insufficient documentation

## 2020-11-27 DIAGNOSIS — R0602 Shortness of breath: Secondary | ICD-10-CM | POA: Insufficient documentation

## 2020-11-27 DIAGNOSIS — I10 Essential (primary) hypertension: Secondary | ICD-10-CM | POA: Diagnosis not present

## 2020-11-27 DIAGNOSIS — R1084 Generalized abdominal pain: Secondary | ICD-10-CM

## 2020-11-27 DIAGNOSIS — F0280 Dementia in other diseases classified elsewhere without behavioral disturbance: Secondary | ICD-10-CM | POA: Diagnosis not present

## 2020-11-27 DIAGNOSIS — G309 Alzheimer's disease, unspecified: Secondary | ICD-10-CM | POA: Insufficient documentation

## 2020-11-27 DIAGNOSIS — R109 Unspecified abdominal pain: Secondary | ICD-10-CM | POA: Diagnosis present

## 2020-11-27 DIAGNOSIS — K219 Gastro-esophageal reflux disease without esophagitis: Secondary | ICD-10-CM | POA: Insufficient documentation

## 2020-11-27 DIAGNOSIS — Z87891 Personal history of nicotine dependence: Secondary | ICD-10-CM | POA: Diagnosis not present

## 2020-11-27 DIAGNOSIS — Z79899 Other long term (current) drug therapy: Secondary | ICD-10-CM | POA: Insufficient documentation

## 2020-11-27 HISTORY — DX: Unspecified fracture of skull, initial encounter for closed fracture: S02.91XA

## 2020-11-27 HISTORY — DX: Dementia in other diseases classified elsewhere, unspecified severity, without behavioral disturbance, psychotic disturbance, mood disturbance, and anxiety: F02.80

## 2020-11-27 LAB — CBC WITH DIFFERENTIAL/PLATELET
Abs Immature Granulocytes: 0.03 10*3/uL (ref 0.00–0.07)
Basophils Absolute: 0.1 10*3/uL (ref 0.0–0.1)
Basophils Relative: 1 %
Eosinophils Absolute: 0.3 10*3/uL (ref 0.0–0.5)
Eosinophils Relative: 3 %
HCT: 38.6 % (ref 36.0–46.0)
Hemoglobin: 12.8 g/dL (ref 12.0–15.0)
Immature Granulocytes: 0 %
Lymphocytes Relative: 29 %
Lymphs Abs: 2.7 10*3/uL (ref 0.7–4.0)
MCH: 31.1 pg (ref 26.0–34.0)
MCHC: 33.2 g/dL (ref 30.0–36.0)
MCV: 93.9 fL (ref 80.0–100.0)
Monocytes Absolute: 0.7 10*3/uL (ref 0.1–1.0)
Monocytes Relative: 8 %
Neutro Abs: 5.6 10*3/uL (ref 1.7–7.7)
Neutrophils Relative %: 59 %
Platelets: 181 10*3/uL (ref 150–400)
RBC: 4.11 MIL/uL (ref 3.87–5.11)
RDW: 12.8 % (ref 11.5–15.5)
WBC: 9.3 10*3/uL (ref 4.0–10.5)
nRBC: 0 % (ref 0.0–0.2)

## 2020-11-27 LAB — URINALYSIS, ROUTINE W REFLEX MICROSCOPIC
Bilirubin Urine: NEGATIVE
Glucose, UA: NEGATIVE mg/dL
Hgb urine dipstick: NEGATIVE
Ketones, ur: NEGATIVE mg/dL
Leukocytes,Ua: NEGATIVE
Nitrite: NEGATIVE
Protein, ur: NEGATIVE mg/dL
Specific Gravity, Urine: 1.01 (ref 1.005–1.030)
pH: 5 (ref 5.0–8.0)

## 2020-11-27 LAB — I-STAT CHEM 8, ED
BUN: 14 mg/dL (ref 8–23)
Calcium, Ion: 1.27 mmol/L (ref 1.15–1.40)
Chloride: 103 mmol/L (ref 98–111)
Creatinine, Ser: 0.9 mg/dL (ref 0.44–1.00)
Glucose, Bld: 98 mg/dL (ref 70–99)
HCT: 38 % (ref 36.0–46.0)
Hemoglobin: 12.9 g/dL (ref 12.0–15.0)
Potassium: 3.5 mmol/L (ref 3.5–5.1)
Sodium: 141 mmol/L (ref 135–145)
TCO2: 28 mmol/L (ref 22–32)

## 2020-11-27 LAB — COMPREHENSIVE METABOLIC PANEL
ALT: 11 U/L (ref 0–44)
AST: 17 U/L (ref 15–41)
Albumin: 4.1 g/dL (ref 3.5–5.0)
Alkaline Phosphatase: 95 U/L (ref 38–126)
Anion gap: 6 (ref 5–15)
BUN: 16 mg/dL (ref 8–23)
CO2: 28 mmol/L (ref 22–32)
Calcium: 9.5 mg/dL (ref 8.9–10.3)
Chloride: 105 mmol/L (ref 98–111)
Creatinine, Ser: 0.83 mg/dL (ref 0.44–1.00)
GFR, Estimated: >60 mL/min (ref >60)
Glucose, Bld: 104 mg/dL — ABNORMAL HIGH (ref 70–99)
Potassium: 3.5 mmol/L (ref 3.5–5.1)
Sodium: 139 mmol/L (ref 135–145)
Total Bilirubin: 0.5 mg/dL (ref 0.3–1.2)
Total Protein: 6.7 g/dL (ref 6.5–8.1)

## 2020-11-27 LAB — TROPONIN I (HIGH SENSITIVITY)
Troponin I (High Sensitivity): 4 ng/L (ref <18)
Troponin I (High Sensitivity): 3 ng/L (ref <18)

## 2020-11-27 LAB — LIPASE, BLOOD: Lipase: 38 U/L (ref 11–51)

## 2020-11-27 MED ORDER — IOHEXOL 350 MG/ML SOLN
80.0000 mL | Freq: Once | INTRAVENOUS | Status: AC | PRN
  Administered 2020-11-27: 80 mL via INTRAVENOUS

## 2020-11-27 NOTE — ED Triage Notes (Signed)
Patient c/o abdominal pain from lower abdomen to mid chest that began around 1200 today. Patient denies any n/v/d.

## 2020-11-27 NOTE — ED Provider Notes (Signed)
[place] DEPT Provider Note   CSN: [id number] Arrival date & time: 11/27/20  1624     History Chief Complaint  Patient presents with   Abdominal Pain    Shelly Farrell is a 70 y.o. female.  HPI Patient is a 70 year old female with past medical history significant for Alzheimer's dementia this was diagnosed within the past couple months.  Also with history of reflux, HLD, HTN arthritis and anxiety  Patient states that over the past 4 hours she has had 2 episodes of severe abdominal pain that seems to radiate all the way up into her chest and also backwards towards her low back she states that this has come on without any particular provocation.  She was not exerting herself or doing any heavy lifting when this occurred.  She states that she was given a medicine some kind by her husband--later confirmed that this was an antacid--with minimal relief.  She states that she felt somewhat short of breath during the episode she denies any coughing hemoptysis lightheadedness or dizziness.  She states that the symptoms have all resolved at this time with the exception of some mild abdominal pain that she is still experiencing which is achy and seems to be generalized she states it is not particularly painful in any 1 part of her abdomen.  Denies any history of AAA or thoracic aortic dissection.  Her past surgical history is notable for abdominal hysterectomy tubal ligation    Past Medical History:  Diagnosis Date   Alzheimer's dementia ([place])    Anxiety    Arthritis    Depression    GERD (gastroesophageal reflux disease)    Hyperlipidemia    Hypertension    Neuromuscular disorder ([place])    Skull fracture [facility])     Patient Active Problem List   Diagnosis Date Noted   Confusion 07/03/2019   Cerebral amyloid angiopathy (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     Family History  Problem Relation Age of Onset   Cancer Father    Hepatitis C Daughter     Social History   Tobacco Use   Smoking status: Former   Smokeless tobacco: Never  Scientific laboratory technician Use: Never used  Substance Use Topics   Alcohol use: No    Alcohol/week: 0.0 standard drinks   Drug use: No    Home Medications Prior to Admission medications   Medication Sig Start Date End Date Taking? Authorizing Provider  amLODipine (NORVASC) 10 MG tablet Take 10 mg by mouth daily. 06/11/19   [provider]  ascorbic Acid (VITAMIN C) 500 MG CPCR Take 1 capsule by mouth daily.    [provider]  busPIRone (BUSPAR) 5 MG tablet Take 5 mg by mouth 2 (two) times daily as needed for anxiety. 03/20/19   [provider]  butalbital-acetaminophen-caffeine (FIORICET) 50-325-40 MG tablet Take 1 tablet by mouth every  6 (six) hours as needed for headache. 03/11/19   Donzetta Starch, NP  cholecalciferol (VITAMIN D) 1000 units tablet Take 1,000 Units by mouth daily.    [provider]  DULoxetine (CYMBALTA) 30 MG capsule Take 30 mg by mouth daily. 06/17/19   [provider]  DULoxetine (CYMBALTA) 60 MG capsule Take 60 mg by mouth daily.  02/14/18   [provider]  losartan (COZAAR) 50 MG tablet Take 50 mg by mouth daily. 02/28/19   [provider]  meclizine (ANTIVERT) 25 MG tablet Take 25 mg by mouth 2 (two) times daily as needed for dizziness.  11/23/18   [provider]  memantine (NAMENDA) 10 MG tablet Take 10 mg by mouth 2 (two) times daily. 05/21/19   [provider]  Multiple Vitamin (MULTIVITAMIN WITH MINERALS) TABS tablet Take 1 tablet by mouth daily.    [provider]  omeprazole (PRILOSEC) 20 MG capsule Take 20 mg by mouth daily.    [provider]  ondansetron (ZOFRAN) 4 MG tablet Take 4 mg by mouth every 8 (eight) hours as needed for nausea/vomiting. 09/10/19   [provider]  propranolol (INDERAL) 10 MG tablet Take 10 mg by mouth 2 (two) times daily. 02/25/19   [provider]  simvastatin (ZOCOR) 40 MG tablet Take 1 tablet (40 mg total) by mouth at bedtime. 03/11/19   Donzetta Starch, NP    Allergies    Lisinopril and Donepezil  Review of Systems   Review of Systems  Constitutional:  Negative for chills and fever.  HENT:  Negative for congestion.   Eyes:  Negative for pain.  Respiratory:  Negative for cough and shortness of breath.   Cardiovascular:  Positive for chest pain. Negative for leg swelling.  Gastrointestinal:  Positive for abdominal pain. Negative for diarrhea, nausea and vomiting.  Genitourinary:  Negative for dysuria.  Musculoskeletal:  Positive for back pain. Negative for myalgias.  Skin:  Negative for rash.  Neurological:  Negative for dizziness and headaches.   Physical Exam Updated Vital Signs BP (!) 164/61 (BP Location: Right Arm)   Pulse 71   Temp 98.7 F (37.1 C) (Oral)   Resp 16   Ht 5' (1.524 m)   Wt 59 kg   SpO2 98%   BMI 25.39 kg/m   Physical Exam Vitals and nursing note reviewed.  Constitutional:      General: She is not in acute distress.    Comments: Pleasant well-appearing 70 year old.  In no acute distress.  Sitting comfortably in bed.  Able answer questions appropriately follow commands. No increased work of breathing. Speaking in full sentences.   HENT:     Head: Normocephalic and atraumatic.     Nose: Nose normal.   Eyes:     General: No scleral icterus. Cardiovascular:     Rate and Rhythm: Normal rate and regular rhythm.     Pulses: Normal pulses.     Heart sounds: Normal heart sounds.     Comments: Bilateral radial artery pulses 2+ and symmetric Pulmonary:     Effort: Pulmonary effort is normal. No respiratory distress.     Breath sounds: No wheezing.  Abdominal:     Palpations: Abdomen is soft.     Tenderness: There is abdominal tenderness.     Comments: Mild diffuse abd TTP Some umbilical TTP initially. W distraction no TTP  Musculoskeletal:     Cervical back: Normal range of motion.     Right lower leg: No  edema.     Left lower leg: No edema.  Skin:    General: Skin is warm and dry.     Capillary Refill: Capillary refill takes less than 2 seconds.  Neurological:     Mental Status: She is alert. Mental status is at baseline.  Psychiatric:        Mood and Affect: Mood normal.        Behavior: Behavior normal.    ED Results / Procedures / Treatments   Labs (all labs ordered are listed, but only abnormal results are displayed) Labs Reviewed  COMPREHENSIVE METABOLIC PANEL - Abnormal; Notable for the following components:      Result Value   Glucose, Bld 104 (*)    All other components within normal limits  CBC WITH DIFFERENTIAL/PLATELET  LIPASE, BLOOD  URINALYSIS, ROUTINE W REFLEX MICROSCOPIC  I-STAT CHEM 8, ED  TROPONIN I (HIGH SENSITIVITY)  TROPONIN I (HIGH SENSITIVITY)    EKG EKG Interpretation  Date/Time:  Friday November 27 2020 16:41:55 EST Ventricular Rate:  67 PR Interval:  164 QRS Duration: 82 QT Interval:  386 QTC Calculation: 408 R Axis:   47 Text Interpretation: Sinus rhythm Minimal ST elevation, anterior leads No significant change since last tracing Confirmed by Isla Pence [phone]) on 11/27/2020 5:42:41 PM  Radiology DG Chest 2 View  Result Date: 11/27/2020 CLINICAL DATA:  Chest pain EXAM: CHEST - 2 VIEW COMPARISON:  09/13/2019 FINDINGS: The heart  size and mediastinal contours are within normal limits. Both lungs are clear. Mild linear scarring in the lingula. The visualized skeletal structures are unremarkable. IMPRESSION: No active cardiopulmonary disease. Electronically Signed   By: Franchot Gallo M.D.   On: 11/27/2020 17:04   CT Angio Chest/Abd/Pel for Dissection W and/or W/WO  Result Date: 11/27/2020 CLINICAL DATA:  Abdominal pain and chest pain. EXAM: CT ANGIOGRAPHY CHEST, ABDOMEN AND PELVIS TECHNIQUE: Non-contrast CT of the chest was initially obtained. Multidetector CT imaging through the chest, abdomen and pelvis was performed using the standard protocol during bolus administration of intravenous contrast. Multiplanar reconstructed images and MIPs were obtained and reviewed to evaluate the vascular anatomy. CONTRAST:  74mL OMNIPAQUE IOHEXOL 350 MG/ML SOLN COMPARISON:  July 04, 2019 FINDINGS: CTA CHEST FINDINGS Cardiovascular: There is mild calcification of the aortic arch without evidence of aortic aneurysm or dissection. Satisfactory opacification of the pulmonary arteries to the segmental level. No evidence of pulmonary embolism. Normal heart size. No pericardial effusion. Mediastinum/Nodes: No enlarged mediastinal, hilar, or axillary lymph nodes. Thyroid gland, trachea, and esophagus demonstrate no significant findings. Lungs/Pleura: Very mild linear scarring and/or atelectasis is seen within the bilateral apices and anteromedial aspects of the bilateral upper lobes and lateral left lung base. There is no evidence of acute infiltrate, pleural effusion or pneumothorax. Musculoskeletal: No chest wall abnormality. No acute or significant osseous findings. Review of the MIP images confirms the above findings. CTA ABDOMEN AND PELVIS FINDINGS VASCULAR Aorta: Mild to moderate severity calcification and atherosclerosis without aneurysm, dissection, vasculitis or significant stenosis. Celiac: Mild calcification without evidence of aneurysm, dissection,  vasculitis or significant stenosis. SMA: Mild calcification without evidence of aneurysm, dissection, vasculitis or significant stenosis. Renals: Both renal arteries are patent without evidence of aneurysm, dissection, vasculitis, fibromuscular dysplasia or significant stenosis. IMA: Patent without evidence of aneurysm, dissection, vasculitis or significant stenosis. Inflow: Moderate severity calcification and atherosclerosis without evidence of aneurysm, dissection, vasculitis or significant stenosis. Veins: No obvious venous abnormality within the limitations of this arterial phase study. Review of the MIP  images confirms the above findings. NON-VASCULAR Hepatobiliary: No focal liver abnormality is seen. The gallbladder is partially contracted. Mild, nonspecific diffuse subsequent gallbladder wall thickening is seen. No gallstones or biliary dilatation. Pancreas: Unremarkable. No pancreatic ductal dilatation or surrounding inflammatory changes. Spleen: Normal in size without focal abnormality. Adrenals/Urinary Tract: Adrenal glands are unremarkable. Kidneys are normal in size, without renal calculi or hydronephrosis. A 4.1 cm simple cyst is seen within the upper pole of the left kidney. The urinary bladder is partially contracted and subsequently limited in evaluation. Stomach/Bowel: Stomach is within normal limits. Appendix appears normal. No evidence of bowel wall thickening, distention, or inflammatory changes. Lymphatic: No abnormal abdominal or pelvic lymph nodes are identified. Reproductive: Status post hysterectomy. No adnexal masses. Other: No abdominal wall hernia or abnormality. No abdominopelvic ascites. Musculoskeletal: Moderate severity multilevel degenerative changes are seen throughout the lumbar spine. Review of the MIP images confirms the above findings. IMPRESSION: 1. No evidence of aortic aneurysm or dissection. 2. No evidence of pulmonary embolism. 3. Very mild bilateral upper lobe and left  lower lobe linear scarring and/or atelectasis. 4. Mild, nonspecific diffuse subsequent gallbladder wall thickening which may be secondary to the partially contracted nature of the gallbladder. 5. 4.1 cm simple cyst within the left kidney. 6. Aortic atherosclerosis. Aortic Atherosclerosis (ICD10-I70.0). Electronically Signed   By: Virgina Norfolk M.D.   On: 11/27/2020 19:12    Procedures Procedures   Medications Ordered in ED Medications  iohexol (OMNIPAQUE) 350 MG/ML injection 80 mL (80 mLs Intravenous Contrast Given 11/27/20 1816)    ED Course  I have reviewed the triage vital signs and the nursing notes.  Pertinent labs & imaging results that were available during my care of the patient were reviewed by me and considered in my medical decision making (see chart for details).    MDM Rules/Calculators/A&P                          Patient with dementia evaluated in triage and was complaining of very severe abdominal pain that radiated to her chest and abdomen there was concern for thoracic aortic dissection versus AAA and CT angio chest abdomen pelvis was ordered.  On my evaluation patient is sitting calmly in bed denies any pain she does have dementia and history is somewhat limited.  I did call her husband who states that he gave her a antacid which seemed to mildly improve her symptoms he states that she can be very dramatic sometimes about symptoms  On abdominal exam patient has some diffuse tenderness somewhat tender in the umbilicus however with distraction does not seem to have any discomfort with palpation. Overall she is well-appearing low suspicion for acute emergent condition we will follow-up on orders placed in triage as I think these are appropriate given her initial presentation.  Troponin x2 within normal limits EKG nonischemic.  Doubt ACS.  Lipase within normal limits.  I personally reviewed all laboratory work and imaging.  Metabolic panel without any acute abnormality  specifically kidney function within normal limits and no significant electrolyte abnormalities. CBC without leukocytosis or significant anemia.   Urinalysis unremarkable  CT angio chest abdomen pelvis negative for dissection or AAA does show small renal cyst which can be followed by PCP.  Chest x-ray unremarkable.  Discussed these findings with patient and over the phone with her husband who she lives with.  We will discharge patient home at this time patient was seen by my attending physician  prior to DC.  She is tolerating p.o. and denies any pain on my reexamination has no tenderness.  Final Clinical Impression(s) / ED Diagnoses Final diagnoses:  Generalized abdominal pain  Kidney cysts    Rx / DC Orders ED Discharge Orders     None        Tedd Sias, [place] 11/27/20 2257    Isla Pence, MD 11/27/20 2259

## 2020-11-27 NOTE — Discharge Instructions (Addendum)
Your work-up today was unremarkable you do have a small cyst on your kidney which can be normal.  This can be followed by your primary care provider.  Please return to the ER for any new or concerning symptoms.  Please take all your prescribed occasions.

## 2020-11-27 NOTE — ED Provider Notes (Addendum)
Emergency Medicine Provider Triage Evaluation Note  Shelly Farrell , Farrell 70 y.o. female  was evaluated in triage.  Pt complains of abd pain and chest pain.  Began approximately 4 hours ago.  States it goes from her suprapubic region into the center of her chest.  No radiation.  Feels short of breath.  No cough.  Has history of dementia, majority of history provided from daughter. Also with some back pain. They gave Alka-Seltzer, they thought this was reflux which did not help her symptoms.  Family denies any prior history of AAA, dissection.  Denies any urinary complaints.  Patient tearful in room  Review of Systems  Positive: Abdominal pain, chest pain, suprapubic pain Negative:   Physical Exam  BP (!) 144/83 (BP Location: Right Arm)   Pulse 67   Temp 98.7 F (37.1 C) (Oral)   Resp 16   Ht 5' (1.524 m)   Wt 59 kg   SpO2 98%   BMI 25.39 kg/m  Gen:   Awake, no distress   Resp:  Normal effort  MSK:   Moves extremities without difficulty  ABD:  Diffuse tenderness to abdomen with guarding.  No rebound Other:    Medical Decision Making  Medically screening exam initiated at 4:45 PM.  Appropriate orders placed.  Shelly Farrell was informed that the remainder of the evaluation will be completed by another provider, this initial triage assessment does not replace that evaluation, and the importance of remaining in the ED until their evaluation is complete.  Abdominal pain, chest pain, shortness of breath, back pain  Baseline mentation per daughter  Neurovascularly intact  Mild hypertension  Concerning story however difficult to assess as patient with history of dementia.  Given chest pain, abdominal pain, shortness of breath, vomiting will obtain CT imaging, nursing notified patient needs room in back      Shelly Treanor A, PA-C 11/27/20 1658    Shelly Rasmussen, MD 11/28/20 1050

## 2021-02-02 ENCOUNTER — Emergency Department (HOSPITAL_BASED_OUTPATIENT_CLINIC_OR_DEPARTMENT_OTHER): Payer: Medicare Other

## 2021-02-02 ENCOUNTER — Other Ambulatory Visit: Payer: Self-pay

## 2021-02-02 ENCOUNTER — Encounter (HOSPITAL_BASED_OUTPATIENT_CLINIC_OR_DEPARTMENT_OTHER): Payer: Self-pay | Admitting: Emergency Medicine

## 2021-02-02 ENCOUNTER — Observation Stay (HOSPITAL_BASED_OUTPATIENT_CLINIC_OR_DEPARTMENT_OTHER)
Admission: EM | Admit: 2021-02-02 | Discharge: 2021-02-04 | Disposition: A | Discharge status: Home / Self Care | Payer: Medicare Other | Attending: Internal Medicine | Admitting: Internal Medicine

## 2021-02-02 DIAGNOSIS — Z9181 History of falling: Secondary | ICD-10-CM | POA: Diagnosis not present

## 2021-02-02 DIAGNOSIS — K219 Gastro-esophageal reflux disease without esophagitis: Secondary | ICD-10-CM | POA: Diagnosis not present

## 2021-02-02 DIAGNOSIS — I10 Essential (primary) hypertension: Secondary | ICD-10-CM | POA: Diagnosis present

## 2021-02-02 DIAGNOSIS — Z79899 Other long term (current) drug therapy: Secondary | ICD-10-CM | POA: Insufficient documentation

## 2021-02-02 DIAGNOSIS — G309 Alzheimer's disease, unspecified: Secondary | ICD-10-CM | POA: Insufficient documentation

## 2021-02-02 DIAGNOSIS — Z20822 Contact with and (suspected) exposure to covid-19: Secondary | ICD-10-CM | POA: Diagnosis not present

## 2021-02-02 DIAGNOSIS — R945 Abnormal results of liver function studies: Secondary | ICD-10-CM | POA: Insufficient documentation

## 2021-02-02 DIAGNOSIS — Z87891 Personal history of nicotine dependence: Secondary | ICD-10-CM | POA: Insufficient documentation

## 2021-02-02 DIAGNOSIS — R1011 Right upper quadrant pain: Secondary | ICD-10-CM

## 2021-02-02 DIAGNOSIS — K8050 Calculus of bile duct without cholangitis or cholecystitis without obstruction: Principal | ICD-10-CM | POA: Insufficient documentation

## 2021-02-02 DIAGNOSIS — I680 Cerebral amyloid angiopathy: Secondary | ICD-10-CM

## 2021-02-02 DIAGNOSIS — R1084 Generalized abdominal pain: Secondary | ICD-10-CM | POA: Diagnosis present

## 2021-02-02 DIAGNOSIS — R7989 Other specified abnormal findings of blood chemistry: Secondary | ICD-10-CM | POA: Diagnosis not present

## 2021-02-02 DIAGNOSIS — E854 Organ-limited amyloidosis: Secondary | ICD-10-CM | POA: Diagnosis not present

## 2021-02-02 DIAGNOSIS — K859 Acute pancreatitis without necrosis or infection, unspecified: Secondary | ICD-10-CM

## 2021-02-02 DIAGNOSIS — R748 Abnormal levels of other serum enzymes: Secondary | ICD-10-CM

## 2021-02-02 DIAGNOSIS — R17 Unspecified jaundice: Secondary | ICD-10-CM

## 2021-02-02 DIAGNOSIS — F0280 Dementia in other diseases classified elsewhere without behavioral disturbance: Secondary | ICD-10-CM | POA: Diagnosis not present

## 2021-02-02 DIAGNOSIS — R4189 Other symptoms and signs involving cognitive functions and awareness: Secondary | ICD-10-CM | POA: Diagnosis present

## 2021-02-02 LAB — CBC WITH DIFFERENTIAL/PLATELET
Abs Immature Granulocytes: 0.03 10*3/uL (ref 0.00–0.07)
Basophils Absolute: 0 10*3/uL (ref 0.0–0.1)
Basophils Relative: 0 %
Eosinophils Absolute: 0.2 10*3/uL (ref 0.0–0.5)
Eosinophils Relative: 2 %
HCT: 37 % (ref 36.0–46.0)
Hemoglobin: 12.1 g/dL (ref 12.0–15.0)
Immature Granulocytes: 0 %
Lymphocytes Relative: 9 %
Lymphs Abs: 0.7 10*3/uL (ref 0.7–4.0)
MCH: 30.1 pg (ref 26.0–34.0)
MCHC: 32.7 g/dL (ref 30.0–36.0)
MCV: 92 fL (ref 80.0–100.0)
Monocytes Absolute: 0.9 10*3/uL (ref 0.1–1.0)
Monocytes Relative: 11 %
Neutro Abs: 6.2 10*3/uL (ref 1.7–7.7)
Neutrophils Relative %: 78 %
Platelets: 173 10*3/uL (ref 150–400)
RBC: 4.02 MIL/uL (ref 3.87–5.11)
RDW: 13.4 % (ref 11.5–15.5)
WBC: 8 10*3/uL (ref 4.0–10.5)
nRBC: 0 % (ref 0.0–0.2)

## 2021-02-02 LAB — RESP PANEL BY RT-PCR (FLU A&B, COVID) ARPGX2
Influenza A by PCR: NEGATIVE
Influenza B by PCR: NEGATIVE
SARS Coronavirus 2 by RT PCR: NEGATIVE

## 2021-02-02 LAB — COMPREHENSIVE METABOLIC PANEL
ALT: 107 U/L — ABNORMAL HIGH (ref 0–44)
AST: 81 U/L — ABNORMAL HIGH (ref 15–41)
Albumin: 3.8 g/dL (ref 3.5–5.0)
Alkaline Phosphatase: 311 U/L — ABNORMAL HIGH (ref 38–126)
Anion gap: 10 (ref 5–15)
BUN: 12 mg/dL (ref 8–23)
CO2: 21 mmol/L — ABNORMAL LOW (ref 22–32)
Calcium: 9 mg/dL (ref 8.9–10.3)
Chloride: 106 mmol/L (ref 98–111)
Creatinine, Ser: 0.75 mg/dL (ref 0.44–1.00)
GFR, Estimated: >60 mL/min (ref >60)
Glucose, Bld: 106 mg/dL — ABNORMAL HIGH (ref 70–99)
Potassium: 3.6 mmol/L (ref 3.5–5.1)
Sodium: 137 mmol/L (ref 135–145)
Total Bilirubin: 2.7 mg/dL — ABNORMAL HIGH (ref 0.3–1.2)
Total Protein: 6.4 g/dL — ABNORMAL LOW (ref 6.5–8.1)

## 2021-02-02 LAB — TROPONIN I (HIGH SENSITIVITY)
Troponin I (High Sensitivity): 4 ng/L (ref <18)
Troponin I (High Sensitivity): 4 ng/L (ref <18)

## 2021-02-02 LAB — LIPASE, BLOOD: Lipase: 453 U/L — ABNORMAL HIGH (ref 11–51)

## 2021-02-02 MED ORDER — CARBAMAZEPINE ER 100 MG PO TB12
100.0000 mg | ORAL_TABLET | Freq: Two times a day (BID) | ORAL | Status: DC
  Administered 2021-02-02 – 2021-02-04 (×4): 100 mg via ORAL
  Filled 2021-02-02 (×4): qty 1

## 2021-02-02 MED ORDER — HYDRALAZINE HCL 20 MG/ML IJ SOLN: 10.0000 mg | Freq: Four times a day (QID) | INTRAMUSCULAR | Status: DC | PRN

## 2021-02-02 MED ORDER — FLUTICASONE PROPIONATE 50 MCG/ACT NA SUSP
2.0000 | Freq: Every day | NASAL | Status: DC
  Administered 2021-02-03 – 2021-02-04 (×2): 2 via NASAL
  Filled 2021-02-02: qty 16

## 2021-02-02 MED ORDER — HYDROMORPHONE HCL 1 MG/ML IJ SOLN: 0.5000 mg | INTRAMUSCULAR | Status: DC | PRN

## 2021-02-02 MED ORDER — DULOXETINE HCL 60 MG PO CPEP
60.0000 mg | ORAL_CAPSULE | Freq: Every day | ORAL | Status: DC
  Administered 2021-02-02 – 2021-02-04 (×3): 60 mg via ORAL
  Filled 2021-02-02 (×4): qty 1

## 2021-02-02 MED ORDER — DULOXETINE HCL 30 MG PO CPEP
30.0000 mg | ORAL_CAPSULE | Freq: Every day | ORAL | Status: DC
  Administered 2021-02-02 – 2021-02-04 (×3): 30 mg via ORAL
  Filled 2021-02-02 (×4): qty 1

## 2021-02-02 MED ORDER — ONDANSETRON HCL 4 MG/2ML IJ SOLN
4.0000 mg | Freq: Four times a day (QID) | INTRAMUSCULAR | Status: DC | PRN
  Administered 2021-02-02 – 2021-02-04 (×2): 4 mg via INTRAVENOUS
  Filled 2021-02-02 (×2): qty 2

## 2021-02-02 MED ORDER — SODIUM CHLORIDE 0.9 % IV SOLN: INTRAVENOUS | Status: DC

## 2021-02-02 MED ORDER — QUETIAPINE FUMARATE 100 MG PO TABS
100.0000 mg | ORAL_TABLET | Freq: Every day | ORAL | Status: DC
  Administered 2021-02-02 – 2021-02-03 (×2): 100 mg via ORAL
  Filled 2021-02-02 (×2): qty 1

## 2021-02-02 MED ORDER — SODIUM CHLORIDE 0.9 % IV SOLN: INTRAVENOUS | Status: DC | PRN

## 2021-02-02 MED ORDER — MEMANTINE HCL 10 MG PO TABS
10.0000 mg | ORAL_TABLET | Freq: Two times a day (BID) | ORAL | Status: DC
  Administered 2021-02-02 – 2021-02-04 (×4): 10 mg via ORAL
  Filled 2021-02-02 (×5): qty 1

## 2021-02-02 MED ORDER — UMECLIDINIUM BROMIDE 62.5 MCG/ACT IN AEPB
1.0000 | INHALATION_SPRAY | Freq: Every day | RESPIRATORY_TRACT | Status: DC
  Administered 2021-02-03 – 2021-02-04 (×2): 1 via RESPIRATORY_TRACT
  Filled 2021-02-02: qty 7

## 2021-02-02 MED ORDER — AMLODIPINE BESYLATE 10 MG PO TABS
10.0000 mg | ORAL_TABLET | Freq: Every day | ORAL | Status: DC
  Administered 2021-02-02 – 2021-02-03 (×2): 10 mg via ORAL
  Filled 2021-02-02 (×3): qty 1

## 2021-02-02 MED ORDER — PANTOPRAZOLE SODIUM 40 MG IV SOLR
40.0000 mg | Freq: Two times a day (BID) | INTRAVENOUS | Status: DC
  Administered 2021-02-02 – 2021-02-04 (×4): 40 mg via INTRAVENOUS
  Filled 2021-02-02 (×5): qty 40

## 2021-02-02 MED ORDER — TIOTROPIUM BROMIDE MONOHYDRATE 1.25 MCG/ACT IN AERS: 2.0000 | INHALATION_SPRAY | Freq: Every day | RESPIRATORY_TRACT | Status: DC

## 2021-02-02 MED ORDER — BUSPIRONE HCL 5 MG PO TABS
5.0000 mg | ORAL_TABLET | Freq: Two times a day (BID) | ORAL | Status: DC | PRN
  Administered 2021-02-02: 5 mg via ORAL
  Filled 2021-02-02 (×2): qty 1

## 2021-02-02 MED ORDER — PROPRANOLOL HCL 10 MG PO TABS
10.0000 mg | ORAL_TABLET | Freq: Two times a day (BID) | ORAL | Status: DC
  Administered 2021-02-02 – 2021-02-04 (×4): 10 mg via ORAL
  Filled 2021-02-02 (×5): qty 1

## 2021-02-02 MED ORDER — IOHEXOL 300 MG/ML  SOLN
75.0000 mL | Freq: Once | INTRAMUSCULAR | Status: AC | PRN
  Administered 2021-02-02: 75 mL via INTRAVENOUS

## 2021-02-02 MED ORDER — ALBUTEROL SULFATE (2.5 MG/3ML) 0.083% IN NEBU: 2.5000 mg | INHALATION_SOLUTION | RESPIRATORY_TRACT | Status: DC | PRN

## 2021-02-02 MED ORDER — ONDANSETRON HCL 4 MG PO TABS
4.0000 mg | ORAL_TABLET | Freq: Four times a day (QID) | ORAL | Status: DC | PRN
  Administered 2021-02-03: 4 mg via ORAL
  Filled 2021-02-02: qty 1

## 2021-02-02 MED ORDER — ENOXAPARIN SODIUM 40 MG/0.4ML IJ SOSY
40.0000 mg | PREFILLED_SYRINGE | INTRAMUSCULAR | Status: DC
  Administered 2021-02-02 – 2021-02-03 (×2): 40 mg via SUBCUTANEOUS
  Filled 2021-02-02 (×2): qty 0.4

## 2021-02-02 MED ORDER — BISACODYL 10 MG RE SUPP: 10.0000 mg | Freq: Every day | RECTAL | Status: DC | PRN

## 2021-02-02 NOTE — ED Notes (Signed)
Back from CT

## 2021-02-02 NOTE — H&P (Addendum)
[facility] History and Physical  Shelly Farrell PPI:[id number] DOB: [birth year] DOA: 02/02/2021  Referring physician: ED  PCP: [facility], [facility]   Patient is coming from: Home  Chief Complaint: Abdominal pain  HPI: Shelly Farrell is a 71 y.o. female with past medical history of cerebral contusion, subarachnoid hemorrhage, traumatic brain injury, expressive aphasia, history of amyloid angiopathy, GERD, hyperlipidemia, cognitive decline, hypertension, presented to the [facility] ED with abdominal pain as reported by her daughter.  Patient was in having intermittent abdominal pain colicky in nature since Sunday but got worse for 1 day.  Patient has not had a bowel movement in 2 to 3 days and her daughter was giving her laxatives without any meaningful response.  Patient was wincing in pain because of abdominal pain at home.  There was no report of nausea vomiting or decreased oral intake but no fever or chills.  No fever chills or urinary symptoms.  No report of shortness of breath cough or sputum production.  ED Course:  In the ED, patient was disoriented at baseline.  Lipase was elevated at 453 with a bilirubin of 2.7 and elevated AST and ALT.  LFTs were normal 2 months back.  CT scan of the abdomen in the ED was nonspecific including ultrasound right upper quadrant.  ED provider had spoken with Dr. Watt Climes who recommended transfer to the hospital for possible MRCP.  Review of Systems:  All systems were reviewed and were negative unless otherwise mentioned in the HPI  Past Medical History:  Diagnosis Date   Alzheimer's dementia ([place])    Anxiety    Arthritis    Depression    GERD (gastroesophageal reflux disease)    Hyperlipidemia    Hypertension    Neuromuscular disorder ([place])    Skull fracture ([place])    Past Surgical History:  Procedure Laterality Date   ABDOMINAL HYSTERECTOMY     TUBAL LIGATION      Social History:  reports that she has  quit smoking. She has never used smokeless tobacco. She reports that she does not drink alcohol and does not use drugs.  Allergies  Allergen Reactions   Lisinopril Swelling    Angioedema   Donepezil     Other reaction(s): Agitation nightmares    Family History  Problem Relation Age of Onset   Cancer Father    Hepatitis C Daughter      Prior to Admission medications   Medication Sig Start Date End Date Taking? Authorizing Provider  amLODipine (NORVASC) 10 MG tablet Take 10 mg by mouth daily. 06/11/19   [provider]  ascorbic Acid (VITAMIN C) 500 MG CPCR Take 1 capsule by mouth daily.    [provider]  busPIRone (BUSPAR) 5 MG tablet Take 5 mg by mouth 2 (two) times daily as needed for anxiety. 03/20/19   [provider]  butalbital-acetaminophen-caffeine (FIORICET) 50-325-40 MG tablet Take 1 tablet by mouth every 6 (six) hours as needed for headache. 03/11/19   Donzetta Starch, NP  carbamazepine (TEGRETOL XR) 100 MG 12 hr tablet Take 100 mg by mouth 2 (two) times daily. 01/30/21   [provider]  cholecalciferol (VITAMIN D) 1000 units tablet Take 1,000 Units by mouth daily.    [provider]  DULoxetine (CYMBALTA) 30 MG capsule Take 30 mg by mouth daily. 06/17/19   [provider]  DULoxetine (CYMBALTA) 60 MG capsule Take 60 mg by mouth daily.  02/14/18   [provider]  fluticasone (  FLONASE) 50 MCG/ACT nasal spray Place 2 sprays into both nostrils at bedtime. 11/30/20   [provider]  losartan (COZAAR) 50 MG tablet Take 50 mg by mouth daily. 02/28/19   [provider]  meclizine (ANTIVERT) 25 MG tablet Take 25 mg by mouth 2 (two) times daily as needed for dizziness. 11/23/18   [provider]  memantine (NAMENDA) 10 MG tablet Take 10 mg by mouth 2 (two) times daily. 05/21/19   [provider]  Multiple Vitamin (MULTIVITAMIN WITH MINERALS) TABS tablet Take 1 tablet by mouth daily.     [provider]  omeprazole (PRILOSEC) 20 MG capsule Take 20 mg by mouth daily.    [provider]  ondansetron (ZOFRAN) 4 MG tablet Take 4 mg by mouth every 8 (eight) hours as needed for nausea/vomiting. 09/10/19   [provider]  propranolol (INDERAL) 10 MG tablet Take 10 mg by mouth 2 (two) times daily. 02/25/19   [provider]  QUEtiapine (SEROQUEL) 100 MG tablet Take 100 mg by mouth at bedtime. 01/05/21   [provider]  rosuvastatin (CRESTOR) 20 MG tablet Take 20 mg by mouth at bedtime. 01/26/21   [provider]  simvastatin (ZOCOR) 40 MG tablet Take 1 tablet (40 mg total) by mouth at bedtime. 03/11/19   Donzetta Starch, NP  SPIRIVA RESPIMAT 1.25 MCG/ACT AERS Take 2 puffs by mouth daily. 12/22/20   [provider]    Physical Exam: Vitals:   02/02/21 1200 02/02/21 1215 02/02/21 1230 02/02/21 1339  BP: 120/73 120/86 (!) 145/58 97/62  Pulse: 63 62 81 67  Resp:  (!) 22  16  Temp:    98.9 F (37.2 C)  TempSrc:    Oral  SpO2: 100% 100% 98% 98%  Weight:      Height:       Wt Readings from Last 3 Encounters:  02/02/21 58.3 kg  11/27/20 59 kg  09/13/19 52.2 kg   Body mass index is 22.06 kg/m.  General:  Average built, not in obvious distress, hard of hearing HENT: Normocephalic, pupils equally reacting to light and accommodation.  No scleral pallor or icterus noted. Oral mucosa is moist.  Chest:   Diminished breath sounds bilaterally. No crackles or wheezes.  CVS: S1 &S2 heard. No murmur.  Regular rate and rhythm. Abdomen: Soft, tenderness over the epigastric region.  Bowel sounds are heard.  Extremities: No cyanosis, clubbing or edema.  Peripheral pulses are palpable. Psych: Hard of hearing, has mild cognitive decline, oriented to place CNS:  No cranial nerve deficits.  Moves all extremities. Skin: Warm and dry.  No rashes noted.  Labs on Admission:   CBC: Recent Labs  Lab 02/02/21 0806  WBC 8.0  NEUTROABS 6.2   HGB 12.1  HCT 37.0  MCV 92.0  PLT 366    Basic Metabolic Panel: Recent Labs  Lab 02/02/21 0806  NA 137  K 3.6  CL 106  CO2 21*  GLUCOSE 106*  BUN 12  CREATININE 0.75  CALCIUM 9.0    Liver Function Tests: Recent Labs  Lab 02/02/21 0806  AST 81*  ALT 107*  ALKPHOS 311*  BILITOT 2.7*  PROT 6.4*  ALBUMIN 3.8   Recent Labs  Lab 02/02/21 0806  LIPASE 453*   No results for input(s): AMMONIA in the last 168 hours.  Cardiac Enzymes: No results for input(s): CKTOTAL, CKMB, CKMBINDEX, TROPONINI in the last 168 hours.  BNP (last 3 results) No results for input(s): BNP  in the last 8760 hours.  ProBNP (last 3 results) No results for input(s): PROBNP in the last 8760 hours.  CBG: No results for input(s): GLUCAP in the last 168 hours.  Lipase     Component Value Date/Time   LIPASE 453 (H) 02/02/2021 0806    Radiological Exams on Admission: CT ABDOMEN PELVIS W CONTRAST  Result Date: 02/02/2021 CLINICAL DATA:  Abdominal pain, acute, nonlocalized. No bowel movement for 3 days. EXAM: CT ABDOMEN AND PELVIS WITH CONTRAST TECHNIQUE: Multidetector CT imaging of the abdomen and pelvis was performed using the standard protocol following bolus administration of intravenous contrast. RADIATION DOSE REDUCTION: This exam was performed according to the departmental dose-optimization program which includes automated exposure control, adjustment of the mA and/or kV according to patient size and/or use of iterative reconstruction technique. CONTRAST:  82mL OMNIPAQUE IOHEXOL 300 MG/ML  SOLN COMPARISON:  CT a of the abdomen and pelvis 11/27/2020 FINDINGS: Lower chest: Dependent atelectasis is present bilaterally. Lungs are otherwise clear. Heart size is within normal limits. Coronary artery calcifications are present. No significant pleural or pericardial effusion is present. Hepatobiliary: The common bile duct is upper limits of normal for age at 8 mm. No obstructing lesion is present.  Gallbladder is distended without inflammatory change. Liver is unremarkable. Pancreas: Unremarkable. No pancreatic ductal dilatation or surrounding inflammatory changes. Spleen: Normal in size without focal abnormality. Adrenals/Urinary Tract: Adrenal glands are normal bilaterally. A 4 cm simple cyst at the upper pole of the left kidney is stable. No other significant renal lesions are present. No stone or mass lesion is present. Ureters are within normal limits. The urinary bladder is unremarkable. Stomach/Bowel: Stomach and duodenum are within normal limits. Small bowel is unremarkable. Terminal ileum is within normal limits. Appendix is visualized and normal. The ascending and transverse colon are within normal limits. Normal stool burden is present in the distal transverse colon. Descending and sigmoid colon are within normal limits. No obstruction inflammatory change is present. Vascular/Lymphatic: Atherosclerotic calcifications are present in the aorta and branch vessels without aneurysm. No significant adenopathy is present. Reproductive: Status post hysterectomy. No adnexal masses. Other: No abdominal wall hernia or abnormality. No abdominopelvic ascites. Musculoskeletal: Mild leftward curvature is centered at L3-4 with associated degenerative endplate changes at V0-3, L4-5 and L5-S1. No focal osseous lesions are present. Vertebral body heights are maintained. IMPRESSION: 1. No acute or focal lesion to explain the patient's abdominal pain. 2. Stable simple cyst at the upper pole of the left kidney. 3. Aortic Atherosclerosis (ICD10-I70.0). Electronically Signed   By: San Morelle M.D.   On: 02/02/2021 09:25   US Abdomen Limited RUQ (LIVER/GB)  Result Date: 02/02/2021 CLINICAL DATA:  Elevated LFTs. EXAM: ULTRASOUND ABDOMEN LIMITED RIGHT UPPER QUADRANT COMPARISON:  CT abdomen pelvis from same day and November 27, 2020. FINDINGS: Gallbladder: Distended with small amount of sludge. No gallstones or  wall thickening visualized. No sonographic Murphy sign noted by sonographer. Common bile duct: Diameter: Mild dilatation of the mid and distal common bile duct up to 9 mm. Liver: Mild central intrahepatic biliary dilatation. No focal lesion identified. Within normal limits in parenchymal echogenicity. Portal vein is patent on color Doppler imaging with normal direction of blood flow towards the liver. Other: None. IMPRESSION: 1. Mild central intrahepatic and extrahepatic biliary dilatation without obvious obstructing stone or lesion, new since November. Given elevated LFTs, recommend ERCP for further evaluation. 2. Distended gallbladder with small amount of sludge. No sonographic evidence of acute cholecystitis. Electronically Signed   By:  Titus Dubin M.D.   On: 02/02/2021 10:56    EKG: Not available for review  Assessment/Plan Principal Problem:   Abnormal liver function tests Active Problems:   Hypertension   GERD (gastroesophageal reflux disease)   Cerebral amyloid angiopathy (HCC)   Cognitive decline   Abdominal pain with elevated LFTs.   Normal LFTs compared to the previous blood work.  Possibility of choledocholithiasis.  Ultrasound of the right upper quadrant showed severe intrahepatic and extrahepatic biliary dilation with distended gallbladder and a small amount of sludge.  GI has been consulted.  GI has recommended MRCP at this time.  We will check urinalysis.  Keep on clears.  Continue IV fluids for now, antiemetics, IV Protonix.  Check CMP daily.    Elevated lipase.  Possibility of acute pancreatitis related to gallstones.  CT scan did not show any pancreatitis changes.  We will put on clears only.  MRCP in AM.  Hold her losartan and Crestor tonight.  History of intra cranial hemorrhage, fall, amyloid angiopathy dementia/cognitive decline.  Continue supportive care.  History of depression. Continue Tegretol and buspirone, duloxetine  History of hyperlipidemia  Hypertension.   Patient takes her losartan at home.  Will hold for now.  Put on as needed hydralazine, resume amlodipine, propanolol.  History of cognitive decline.  Disoriented at baseline.  Continue Namenda, Seroquel from home.  Patient was oriented to place   DVT Prophylaxis: Lovenox subcu  Consultant: GI  Code Status: Full code  Microbiology none  Antibiotics: None  Family Communication:  Patients' condition and plan of care including tests being ordered have been discussed with the patient and the patient's daughter on the phone who indicate understanding and agree with the plan.   Status is: Observation  The patient remains OBS appropriate and will d/c before 2 midnights.  Planned Discharge Destination: Home   Severity of Illness: The appropriate patient status for this patient is OBSERVATION. Observation status is judged to be reasonable and necessary in order to provide the required intensity of service to ensure the patient's safety. The patient's presenting symptoms, physical exam findings, and initial radiographic and laboratory data in the context of their medical condition is felt to place them at decreased risk for further clinical deterioration. Furthermore, it is anticipated that the patient will be medically stable for discharge from the hospital within 2 midnights of admission.   Signed, Flora Lipps, MD [facility] 02/02/2021

## 2021-02-02 NOTE — ED Provider Notes (Signed)
[facility] EMERGENCY DEPT Provider Note   CSN: [id number] Arrival date & time: 02/02/21  5361     History  Chief Complaint  Patient presents with   Abdominal Pain    Shelly Farrell is a 71 y.o. female.  Pt is a 71 yo female with pmh of cerebral contusion, SAH, and expressive aphasia presenting for abdominal pain. Patient's daughter helps to contribute to hx. States patient has been complaining of generalized abdominal pain for three days, severe enough to make her cry, associated constipation x 3 days. Denies nausea, vomiting, or inability to tolerate PO.    Abdominal Pain Associated symptoms: constipation   Associated symptoms: no chest pain, no chills, no cough, no dysuria, no fever, no hematuria, no shortness of breath, no sore throat and no vomiting       Home Medications Prior to Admission medications   Medication Sig Start Date End Date Taking? Authorizing Provider  amLODipine (NORVASC) 10 MG tablet Take 10 mg by mouth daily. 06/11/19   [provider]  ascorbic Acid (VITAMIN C) 500 MG CPCR Take 1 capsule by mouth daily.    [provider]  busPIRone (BUSPAR) 5 MG tablet Take 5 mg by mouth 2 (two) times daily as needed for anxiety. 03/20/19   [provider]  butalbital-acetaminophen-caffeine (FIORICET) 50-325-40 MG tablet Take 1 tablet by mouth every 6 (six) hours as needed for headache. 03/11/19   Donzetta Starch, NP  cholecalciferol (VITAMIN D) 1000 units tablet Take 1,000 Units by mouth daily.    [provider]  DULoxetine (CYMBALTA) 30 MG capsule Take 30 mg by mouth daily. 06/17/19   [provider]  DULoxetine (CYMBALTA) 60 MG capsule Take 60 mg by mouth daily.  02/14/18   [provider]  losartan (COZAAR) 50 MG tablet Take 50 mg by mouth daily. 02/28/19   [provider]  meclizine (ANTIVERT) 25 MG tablet Take 25 mg by mouth 2 (two) times daily as needed for dizziness. 11/23/18   [provider]  memantine (NAMENDA) 10 MG tablet Take 10 mg by mouth 2 (two) times daily. 05/21/19   [provider]  Multiple Vitamin (MULTIVITAMIN WITH MINERALS) TABS tablet Take 1 tablet by mouth daily.    [provider]  omeprazole (PRILOSEC) 20 MG capsule Take 20 mg by mouth daily.    [provider]  ondansetron (ZOFRAN) 4 MG tablet Take 4 mg by mouth every 8 (eight) hours as needed for nausea/vomiting. 09/10/19   [provider]  propranolol (INDERAL) 10 MG tablet Take 10 mg by mouth 2 (two) times daily. 02/25/19   [provider]  simvastatin (ZOCOR) 40 MG tablet Take 1 tablet (40 mg total) by mouth at bedtime. 03/11/19   Donzetta Starch, NP      Allergies    Lisinopril and Donepezil    Review of Systems   Review of Systems  Constitutional:  Negative for chills and fever.  HENT:  Negative for ear pain and sore throat.   Eyes:  Negative for pain and visual disturbance.  Respiratory:  Negative for cough and shortness of breath.   Cardiovascular:  Negative for chest pain and palpitations.  Gastrointestinal:  Positive for abdominal pain and constipation. Negative for vomiting.  Genitourinary:  Negative for dysuria and hematuria.  Musculoskeletal:  Negative for arthralgias and back pain.  Skin:  Negative for color change and rash.  Neurological:  Negative for seizures and syncope.  All other systems reviewed and  are negative.  Physical Exam Updated Vital Signs BP 112/62 (BP Location: Left Arm)    Pulse 68    Temp 98.5 F (36.9 C) (Oral)    Resp 14    Ht 5\' 4"  (1.626 m)    Wt 58.3 kg    SpO2 99%    BMI 22.06 kg/m  Physical Exam Vitals and nursing note reviewed.  Constitutional:      General: She is not in acute distress.    Appearance: She is well-developed.  HENT:     Head: Normocephalic and atraumatic.  Eyes:     Conjunctiva/sclera: Conjunctivae normal.  Cardiovascular:     Rate and Rhythm: Normal rate and regular rhythm.     Heart  sounds: No murmur heard. Pulmonary:     Effort: Pulmonary effort is normal. No respiratory distress.     Breath sounds: Normal breath sounds.  Abdominal:     Palpations: Abdomen is soft.     Tenderness: There is no abdominal tenderness.  Musculoskeletal:        General: No swelling.     Cervical back: Neck supple.  Skin:    General: Skin is warm and dry.     Capillary Refill: Capillary refill takes less than 2 seconds.  Neurological:     Mental Status: She is alert. She is disoriented.     GCS: GCS eye subscore is 4. GCS verbal subscore is 5. GCS motor subscore is 6.     Comments: Disoriented but baseline as per family  Psychiatric:        Mood and Affect: Mood normal.    ED Results / Procedures / Treatments   Labs (all labs ordered are listed, but only abnormal results are displayed) Labs Reviewed  CBC WITH DIFFERENTIAL/PLATELET  COMPREHENSIVE METABOLIC PANEL  LIPASE, BLOOD    EKG None  Radiology No results found.  Procedures Procedures    Medications Ordered in ED Medications - No data to display  ED Course/ Medical Decision Making/ A&P                           Medical Decision Making Amount and/or Complexity of Data Reviewed Labs: ordered. Radiology: ordered. ECG/medicine tests: ordered.  Risk Prescription drug management. Decision regarding hospitalization.   7:53 AM 71 yo female with pmh of cerebral contusion, SAH, and expressive aphasia presenting for abodminal pain. Pt is alert and baseline as per daughter at bedside. Abdomen in soft and nontender on exam. Laboratory studies concerning for choledocholithiasis due to elevated liver enzymes, lipase of 453, and bilirubin of 2.7. All these labs were wnl on previous studies 2 months ago.   CT abdomen nonspecific.  [place] RUQ nonspecific.   Recommend transfer for ERCP. I spoke with GI Dr. Watt Climes who agrees to [facility] at [facility] for ER[facility]. I spoke with hospitalist who agrees to accept  patient.        Final Clinical Impression(s) / ED Diagnoses Final diagnoses:  RUQ pain  Choledocholithiasis  Elevated liver enzymes  Elevated lipase  Elevated bilirubin    Rx / DC Orders ED Discharge Orders     None         Lianne Cure, DO 38/25/05 1015

## 2021-02-02 NOTE — Consult Note (Addendum)
Referring Provider: Dr. Olevia Bowens Primary Care Physician:  Medicine, [facility] Family Primary Gastroenterologist: unassigned [facility])  Reason for Consultation: Elevated LFTs, choledocholithiasis  HPI: Shelly Farrell is a 71 y.o. female past medical history of cerebral contusion, subarachnoid hemorrhage, traumatic brain injury, expressive aphasia, history of amyloid angiopathy, GERD, hyperlipidemia, cognitive decline, hypertension presents for evaluation of elevated LFTs.  Called and spoke with Daughter, [phone]. History obtained through daughter as patient is disoriented at baseline. Patient's daughter states Saturday (1/28) patient began complaining pain. Daughter states patient was having pain in her epigastric area radiating to her lower abdomen. They assumed she was constipated since she typically deals with that. After increasing constipation medication, she began to have intermittent pain. EMS was called to the house Sunday (1/29). EMS told patient she had GERD. Pain began to worsen and become more constant which prompted visit to ED. At [facility] [facility], patient was evaluated and found to have elevated LFTs. AST 81/ALT 107/alk phos 311/ T bili 2.7. Lipase was 453. RUQ [place] Mild intrahepatic and extrahepatic biliary dilation without obstructing stone.  Distended gallbladder with small amount of sludge. Patient was then transferred to [facility]. Daughter states patient does not use alcohol, IVDU, or tobacco. Uses NSAIDS prn for headaches. Unknown of any family history of GI issues.  EGD/Colon 06/2013 by Dr. Shana Chute with [facility] EGD done for dysphagia: normal esophagus, stomach, and duodenum. Dilation was performed. Colonoscopy: good prep, tortuous colon, normal, no polyps removed.  Past Medical History:  Diagnosis Date   Alzheimer's dementia ([place])    Anxiety    Arthritis    Depression    GERD (gastroesophageal reflux disease)    Hyperlipidemia    Hypertension     Neuromuscular disorder ([place])    Skull fracture ([facility])     Past Surgical History:  Procedure Laterality Date   ABDOMINAL HYSTERECTOMY     TUBAL LIGATION      Prior to Admission medications   Medication Sig Start Date End Date Taking? Authorizing Provider  amLODipine (NORVASC) 10 MG tablet Take 10 mg by mouth daily. 06/11/19   [provider]  ascorbic Acid (VITAMIN C) 500 MG CPCR Take 1 capsule by mouth daily.    [provider]  busPIRone (BUSPAR) 5 MG tablet Take 5 mg by mouth 2 (two) times daily as needed for anxiety. 03/20/19   [provider]  butalbital-acetaminophen-caffeine (FIORICET) 50-325-40 MG tablet Take 1 tablet by mouth every 6 (six) hours as needed for headache. 03/11/19   Donzetta Starch, NP  carbamazepine (TEGRETOL XR) 100 MG 12 hr tablet Take 100 mg by mouth 2 (two) times daily. 01/30/21   [provider]  cholecalciferol (VITAMIN D) 1000 units tablet Take 1,000 Units by mouth daily.    [provider]  DULoxetine (CYMBALTA) 30 MG capsule Take 30 mg by mouth daily. 06/17/19   [provider]  DULoxetine (CYMBALTA) 60 MG capsule Take 60 mg by mouth daily.  02/14/18   [provider]  fluticasone (FLONASE) 50 MCG/ACT nasal spray Place 2 sprays into both nostrils at bedtime. 11/30/20   [provider]  losartan (COZAAR) 50 MG tablet Take 50 mg by mouth daily. 02/28/19   [provider]  meclizine (ANTIVERT) 25 MG tablet Take 25 mg by mouth 2 (two) times daily as needed for dizziness. 11/23/18   [provider]  memantine (NAMENDA) 10 MG tablet Take 10 mg by mouth 2 (two) times daily. 05/21/19   [provider]  Multiple Vitamin (MULTIVITAMIN WITH MINERALS) TABS tablet Take 1 tablet by mouth daily.    [provider]  omeprazole (PRILOSEC) 20 MG capsule Take 20 mg by mouth daily.    [provider]  ondansetron (ZOFRAN) 4 MG tablet Take 4 mg by mouth every 8 (eight)  hours as needed for nausea/vomiting. 09/10/19   [provider]  propranolol (INDERAL) 10 MG tablet Take 10 mg by mouth 2 (two) times daily. 02/25/19   [provider]  QUEtiapine (SEROQUEL) 100 MG tablet Take 100 mg by mouth at bedtime. 01/05/21   [provider]  rosuvastatin (CRESTOR) 20 MG tablet Take 20 mg by mouth at bedtime. 01/26/21   [provider]  simvastatin (ZOCOR) 40 MG tablet Take 1 tablet (40 mg total) by mouth at bedtime. 03/11/19   Donzetta Starch, NP  SPIRIVA RESPIMAT 1.25 MCG/ACT AERS Take 2 puffs by mouth daily. 12/22/20   [provider]    Scheduled Meds: Continuous Infusions: PRN Meds:.  Allergies as of 02/02/2021 - Review Complete 02/02/2021  Allergen Reaction Noted   Lisinopril Swelling 07/26/2018   Donepezil  03/20/2019    Family History  Problem Relation Age of Onset   Cancer Father    Hepatitis C Daughter     Social History   Socioeconomic History   Marital status: Married    Spouse name: Not on file   Number of children: Not on file   Years of education: Not on file   Highest education level: Not on file  Occupational History   Not on file  Tobacco Use   Smoking status: Former   Smokeless tobacco: Never  Vaping Use   Vaping Use: Never used  Substance and Sexual Activity   Alcohol use: No    Alcohol/week: 0.0 standard drinks   Drug use: No   Sexual activity: Not on file  Other Topics Concern   Not on file  Social History Narrative   Not on file   Social Determinants of Health   Financial Resource Strain: Not on file  Food Insecurity: Not on file  Transportation Needs: Not on file  Physical Activity: Not on file  Stress: Not on file  Social Connections: Not on file  Intimate Partner Violence: Not on file    Review of Systems: Review of Systems  Unable to perform ROS: Dementia   Physical Exam:Physical Exam Constitutional:      Appearance: Normal appearance.  HENT:     Head: Normocephalic  and atraumatic.     Nose: Nose normal. No congestion.     Mouth/Throat:     Mouth: Mucous membranes are moist.     Pharynx: Oropharynx is clear.  Eyes:     Extraocular Movements: Extraocular movements intact.     Conjunctiva/sclera: Conjunctivae normal.  Cardiovascular:     Rate and Rhythm: Normal rate and regular rhythm.  Pulmonary:     Effort: Pulmonary effort is normal. No respiratory distress.  Abdominal:     General: Abdomen is flat. Bowel sounds are normal. There is no distension.     Palpations: Abdomen is soft. There is no mass.     Tenderness: There is abdominal tenderness (upper abdomen). There is no guarding or rebound.     Hernia: No hernia is present.  Musculoskeletal:        General: No swelling. Normal range of motion.     Cervical back: Normal range of motion and neck supple.  Skin:    General:  Skin is warm and dry.     Coloration: Skin is not jaundiced.  Neurological:     General: No focal deficit present.     Mental Status: She is alert and oriented to person, place, and time.  Psychiatric:        Mood and Affect: Mood normal.        Behavior: Behavior normal.        Thought Content: Thought content normal.        Judgment: Judgment normal.    Vital signs: Vitals:   02/02/21 1230 02/02/21 1339  BP: (!) 145/58 97/62  Pulse: 81 67  Resp:  16  Temp:  98.9 F (37.2 C)  SpO2: 98% 98%        GI:  Lab Results: Recent Labs    02/02/21 0806  WBC 8.0  HGB 12.1  HCT 37.0  PLT 173   BMET Recent Labs    02/02/21 0806  NA 137  K 3.6  CL 106  CO2 21*  GLUCOSE 106*  BUN 12  CREATININE 0.75  CALCIUM 9.0   LFT Recent Labs    02/02/21 0806  PROT 6.4*  ALBUMIN 3.8  AST 81*  ALT 107*  ALKPHOS 311*  BILITOT 2.7*   PT/INR No results for input(s): LABPROT, INR in the last 72 hours.   Studies/Results: CT ABDOMEN PELVIS W CONTRAST  Result Date: 02/02/2021 CLINICAL DATA:  Abdominal pain, acute, nonlocalized. No bowel movement for 3 days.  EXAM: CT ABDOMEN AND PELVIS WITH CONTRAST TECHNIQUE: Multidetector CT imaging of the abdomen and pelvis was performed using the standard protocol following bolus administration of intravenous contrast. RADIATION DOSE REDUCTION: This exam was performed according to the departmental dose-optimization program which includes automated exposure control, adjustment of the mA and/or kV according to patient size and/or use of iterative reconstruction technique. CONTRAST:  72m OMNIPAQUE IOHEXOL 300 MG/ML  SOLN COMPARISON:  CT a of the abdomen and pelvis 11/27/2020 FINDINGS: Lower chest: Dependent atelectasis is present bilaterally. Lungs are otherwise clear. Heart size is within normal limits. Coronary artery calcifications are present. No significant pleural or pericardial effusion is present. Hepatobiliary: The common bile duct is upper limits of normal for age at 8 mm. No obstructing lesion is present. Gallbladder is distended without inflammatory change. Liver is unremarkable. Pancreas: Unremarkable. No pancreatic ductal dilatation or surrounding inflammatory changes. Spleen: Normal in size without focal abnormality. Adrenals/Urinary Tract: Adrenal glands are normal bilaterally. A 4 cm simple cyst at the upper pole of the left kidney is stable. No other significant renal lesions are present. No stone or mass lesion is present. Ureters are within normal limits. The urinary bladder is unremarkable. Stomach/Bowel: Stomach and duodenum are within normal limits. Small bowel is unremarkable. Terminal ileum is within normal limits. Appendix is visualized and normal. The ascending and transverse colon are within normal limits. Normal stool burden is present in the distal transverse colon. Descending and sigmoid colon are within normal limits. No obstruction inflammatory change is present. Vascular/Lymphatic: Atherosclerotic calcifications are present in the aorta and branch vessels without aneurysm. No significant adenopathy is  present. Reproductive: Status post hysterectomy. No adnexal masses. Other: No abdominal wall hernia or abnormality. No abdominopelvic ascites. Musculoskeletal: Mild leftward curvature is centered at L3-4 with associated degenerative endplate changes at LM3-8 L4-5 and L5-S1. No focal osseous lesions are present. Vertebral body heights are maintained. IMPRESSION: 1. No acute or focal lesion to explain the patient's abdominal pain. 2. Stable simple cyst at the upper  pole of the left kidney. 3. Aortic Atherosclerosis (ICD10-I70.0). Electronically Signed   By: San Morelle M.D.   On: 02/02/2021 09:25   US Abdomen Limited RUQ (LIVER/GB)  Result Date: 02/02/2021 CLINICAL DATA:  Elevated LFTs. EXAM: ULTRASOUND ABDOMEN LIMITED RIGHT UPPER QUADRANT COMPARISON:  CT abdomen pelvis from same day and November 27, 2020. FINDINGS: Gallbladder: Distended with small amount of sludge. No gallstones or wall thickening visualized. No sonographic Murphy sign noted by sonographer. Common bile duct: Diameter: Mild dilatation of the mid and distal common bile duct up to 9 mm. Liver: Mild central intrahepatic biliary dilatation. No focal lesion identified. Within normal limits in parenchymal echogenicity. Portal vein is patent on color Doppler imaging with normal direction of blood flow towards the liver. Other: None. IMPRESSION: 1. Mild central intrahepatic and extrahepatic biliary dilatation without obvious obstructing stone or lesion, new since November. Given elevated LFTs, recommend ERCP for further evaluation. 2. Distended gallbladder with small amount of sludge. No sonographic evidence of acute cholecystitis. Electronically Signed   By: Titus Dubin M.D.   On: 02/02/2021 10:56    Impression: Gallstone pancreatitis, elevated LFTs; possibly secondary to choledocholithiasis -AST 81/ALT 107/alk phos 311 -T bili 2.7 - Normal renal function - No leukocytosis - Lipase 453 -CT abdomen pelvis with contrast 1/31: CBD  upper limits of normal (8 mm), gallbladder distended without inflammatory change. - US abdomen RUQ 1/31: Mild intrahepatic and extrahepatic biliary dilation without obstructing stone.  Distended gallbladder with small amount of sludge.  Alzheimer's  Plan: Will get MRCP to rule out choledocholithiasis, if positive will proceed with ERCP. I thoroughly discussed procedure with the patient to include nature, alternatives, benefits, and risks (including but not limited to post ERCP pancreatitis, bleeding, infection, perforation, anesthesia/cardiac pulmonary complications).  Patient verbalized understanding and gave verbal consent to proceed with ERCP. NPO Continue to trend LFTs  Continue aggressive IVF Continue pain management and supportive care [facility] will follow   LOS: 0 days   Niesha Bame Radford Pax  PA-C 02/02/2021, 1:57 PM  Contact #  [phone]

## 2021-02-02 NOTE — ED Triage Notes (Signed)
Pt arrived via EMS from home. Pt has dementia. EMS states the Pt has not had a BM in 3 days. Family gave Pt a laxative that did work. Pt denis N/V.EMS vitals 120/68, HR 71, 95% CBG 171.

## 2021-02-02 NOTE — ED Notes (Signed)
Report to carelink.  

## 2021-02-02 NOTE — Plan of Care (Signed)
Plan of Care Note for accepted transfer  Patient: Shelly Farrell    BWL:[id number]  DOA: 02/02/2021     Facility requesting transfer: [facility]. Requesting Provider: Campbell Stall, DO. Reason for transfer: Abdominal pain/abnormal LFTs. Facility course: 71 year old female with a past medical history of cerebral contusion, SAH, traumatic brain injury, expressive aphasia, depression, GERD, hyperlipidemia, hypertension, ACE inhibitor aggravated angioedema who is brought to the emergency department due to abdominal pain since yesterday.  History is taken from the daughter who stated the patient has been visibly in pain at times.  She has not had a bowel movement in 3 days.  She was giving her laxatives with no response.  Work-up is significant for cholestatic pattern elevation of LFTs.  RUQ US showed distended gallbladder with small amount of sludge.  No sonographic evidence of acute cholecystitis.  There was mild central intrahepatic and extrahepatic biliary dilatation.  ERCP recommended.  Plan of care: The patient is accepted for admission to [place]  unit, at [facility].  Please call [facility] who has accepted seeing the patient in consult when the patient arrives to the hospital.   Author: Reubin Milan, MD  02/02/2021  Check [email or web address] for on-call coverage.  Nursing staff, Please call [place] number on Amion as soon as patient's arrival, so appropriate admitting provider can evaluate the pt.

## 2021-02-03 ENCOUNTER — Observation Stay (HOSPITAL_COMMUNITY): Payer: Medicare Other

## 2021-02-03 DIAGNOSIS — R7989 Other specified abnormal findings of blood chemistry: Secondary | ICD-10-CM | POA: Diagnosis not present

## 2021-02-03 LAB — CBC
HCT: 32.6 % — ABNORMAL LOW (ref 36.0–46.0)
Hemoglobin: 10.6 g/dL — ABNORMAL LOW (ref 12.0–15.0)
MCH: 30.7 pg (ref 26.0–34.0)
MCHC: 32.5 g/dL (ref 30.0–36.0)
MCV: 94.5 fL (ref 80.0–100.0)
Platelets: 153 10*3/uL (ref 150–400)
RBC: 3.45 MIL/uL — ABNORMAL LOW (ref 3.87–5.11)
RDW: 13.6 % (ref 11.5–15.5)
WBC: 5 10*3/uL (ref 4.0–10.5)
nRBC: 0 % (ref 0.0–0.2)

## 2021-02-03 LAB — COMPREHENSIVE METABOLIC PANEL
ALT: 82 U/L — ABNORMAL HIGH (ref 0–44)
AST: 69 U/L — ABNORMAL HIGH (ref 15–41)
Albumin: 3 g/dL — ABNORMAL LOW (ref 3.5–5.0)
Alkaline Phosphatase: 274 U/L — ABNORMAL HIGH (ref 38–126)
Anion gap: 9 (ref 5–15)
BUN: 15 mg/dL (ref 8–23)
CO2: 20 mmol/L — ABNORMAL LOW (ref 22–32)
Calcium: 8.7 mg/dL — ABNORMAL LOW (ref 8.9–10.3)
Chloride: 109 mmol/L (ref 98–111)
Creatinine, Ser: 0.65 mg/dL (ref 0.44–1.00)
GFR, Estimated: >60 mL/min (ref >60)
Glucose, Bld: 75 mg/dL (ref 70–99)
Potassium: 3.5 mmol/L (ref 3.5–5.1)
Sodium: 138 mmol/L (ref 135–145)
Total Bilirubin: 1.8 mg/dL — ABNORMAL HIGH (ref 0.3–1.2)
Total Protein: 5.9 g/dL — ABNORMAL LOW (ref 6.5–8.1)

## 2021-02-03 LAB — HIV ANTIBODY (ROUTINE TESTING W REFLEX): HIV Screen 4th Generation wRfx: NONREACTIVE

## 2021-02-03 LAB — CBC WITH DIFFERENTIAL/PLATELET
Abs Immature Granulocytes: 0.01 10*3/uL (ref 0.00–0.07)
Basophils Absolute: 0 10*3/uL (ref 0.0–0.1)
Basophils Relative: 0 %
Eosinophils Absolute: 0.1 10*3/uL (ref 0.0–0.5)
Eosinophils Relative: 2 %
HCT: 32.6 % — ABNORMAL LOW (ref 36.0–46.0)
Hemoglobin: 10.9 g/dL — ABNORMAL LOW (ref 12.0–15.0)
Immature Granulocytes: 0 %
Lymphocytes Relative: 23 %
Lymphs Abs: 1.4 10*3/uL (ref 0.7–4.0)
MCH: 31.4 pg (ref 26.0–34.0)
MCHC: 33.4 g/dL (ref 30.0–36.0)
MCV: 93.9 fL (ref 80.0–100.0)
Monocytes Absolute: 0.7 10*3/uL (ref 0.1–1.0)
Monocytes Relative: 11 %
Neutro Abs: 3.9 10*3/uL (ref 1.7–7.7)
Neutrophils Relative %: 64 %
Platelets: 163 10*3/uL (ref 150–400)
RBC: 3.47 MIL/uL — ABNORMAL LOW (ref 3.87–5.11)
RDW: 13.5 % (ref 11.5–15.5)
WBC: 6.1 10*3/uL (ref 4.0–10.5)
nRBC: 0 % (ref 0.0–0.2)

## 2021-02-03 LAB — PROTIME-INR
INR: 1.1 (ref 0.8–1.2)
Prothrombin Time: 13.9 seconds (ref 11.4–15.2)

## 2021-02-03 LAB — LIPASE, BLOOD: Lipase: 34 U/L (ref 11–51)

## 2021-02-03 MED ORDER — SODIUM CHLORIDE 0.9 % IV SOLN: INTRAVENOUS | Status: DC

## 2021-02-03 MED ORDER — GADOBUTROL 1 MMOL/ML IV SOLN
6.0000 mL | Freq: Once | INTRAVENOUS | Status: AC | PRN
  Administered 2021-02-03: 6 mL via INTRAVENOUS

## 2021-02-03 NOTE — Progress Notes (Signed)
°  Transition of Care [facility]) Screening Note   Patient Details  Name: Donne Robillard Date of Birth: 1950-06-08   Transition of Care [facility]) CM/SW Contact:    Deetta Siegmann, Marjie Skiff, RN Phone Number: 02/03/2021, 1:54 PM    Transition of Care Department [facility]) has reviewed patient and no TOC needs have been identified at this time. We will continue to monitor patient advancement through interdisciplinary progression rounds. If new patient transition needs arise, please place a TOC consult.

## 2021-02-03 NOTE — Progress Notes (Signed)
PROGRESS NOTE    Shelly Farrell  ZCH:[id number] DOB: Feb 25, 1950 DOA: 02/02/2021 PCP: Medicine, [facility]    Chief Complaint  Patient presents with   Abdominal Pain    Brief Narrative:  Shelly Farrell is a 71 y.o. female with past medical history of cerebral contusion, subarachnoid hemorrhage, traumatic brain injury, expressive aphasia, history of amyloid angiopathy, cognitive decline, hypertension, presented to the [facility] ED with abdominal pain as reported by her daughter.    Subjective:  She is pleasantly confused, daughter reports she is at her baseline dementia She reports persistent  RUQ pain , but is better, she has no appetite, reports feeling weak  remains on ivf, but no n/v  Assessment & Plan:   Principal Problem:   Abnormal liver function tests Active Problems:   Hypertension   GERD (gastroesophageal reflux disease)   Cerebral amyloid angiopathy (HCC)   Cognitive decline  Possible gallstone pancreatitis -Present with elevation of lipase and LFT -Pain has much improved this morning, lipase and LFT improved, MRI negative choledochal lithiasis -Seen by GI, may be able to discharge tomorrow if pain continue get better LFT lipase continue get better, otherwise will consider ERCP   History of hypertension BP low normal Continue propranolol, hold Norvasc and Cozaar  Amyloid angiopathy/dementia/expressive aphasia Daughter report patient is at baseline Daughter desires PT eval, ordered      : Body mass index is 22.06 kg/m.Marland Kitchen      Unresulted Labs (From admission, onward)     Start     Ordered   02/04/21 0500  Comprehensive metabolic panel  Tomorrow morning,   R       Question:  Specimen collection method  Answer:  Lab=Lab collect   02/03/21 1325   02/04/21 0500  Magnesium  Tomorrow morning,   STAT       Question:  Specimen collection method  Answer:  Lab=Lab collect   02/03/21 1325   02/03/21 1326  CBC with  Differential/Platelet  Once,   R       Question:  Specimen collection method  Answer:  Lab=Lab collect   02/03/21 1325   02/02/21 1426  Urinalysis, Routine w reflex microscopic  ONCE - STAT,   STAT        02/02/21 1427   02/02/21 1423  HIV Antibody (routine testing w rflx)  (HIV Antibody (Routine testing w reflex) panel)  Once,   R        02/02/21 1427              DVT prophylaxis: enoxaparin (LOVENOX) injection 40 mg Start: 02/02/21 1800   Code Status: Full Family Communication: Daughter at bedside Disposition:   Status is: Observation   Dispo: The patient is from: Home              Anticipated d/c is to: Home              Anticipated d/c date is: Possible tomorrow                Consultants:  [facility]  Procedures:  None  Antimicrobials:   Anti-infectives (From admission, onward)    None           Objective: Vitals:   02/03/21 0138 02/03/21 0423 02/03/21 0759 02/03/21 0801  BP: (!) 105/45 (!) 111/40    Pulse: 65 66    Resp: 16 16    Temp: 98.5 F (36.9 C) 98.6 F (37 C)    TempSrc:  Oral Oral    SpO2: 95% 95% 97% 97%  Weight:      Height:        Intake/Output Summary (Last 24 hours) at 02/03/2021 1327 Last data filed at 02/03/2021 1300 Gross per 24 hour  Intake 1987.21 ml  Output --  Net 1987.21 ml   Filed Weights   02/02/21 0735  Weight: 58.3 kg    Examination:  General exam: Pleasantly confused , very hard of hearing ,calm and cooperative , NAD Respiratory system: Clear to auscultation. Respiratory effort normal. Cardiovascular system:  RRR.  Gastrointestinal system: Abdomen is nondistended, soft and nontender.  Normal bowel sounds heard. Central nervous system: Alert and oriented to person, pleasant. Extremities:  no edema Skin: No rashes, lesions or ulcers Psychiatry: Pleasant, cooperative, no agitation.     Data Reviewed: I have personally reviewed following labs and imaging studies  CBC: Recent Labs  Lab 02/02/21 0806  02/03/21 0533  WBC 8.0 5.0  NEUTROABS 6.2  --   HGB 12.1 10.6*  HCT 37.0 32.6*  MCV 92.0 94.5  PLT 173 712    Basic Metabolic Panel: Recent Labs  Lab 02/02/21 0806 02/03/21 0533  NA 137 138  K 3.6 3.5  CL 106 109  CO2 21* 20*  GLUCOSE 106* 75  BUN 12 15  CREATININE 0.75 0.65  CALCIUM 9.0 8.7*    GFR: Estimated Creatinine Clearance: 57.3 mL/min (by C-G formula based on SCr of 0.65 mg/dL).  Liver Function Tests: Recent Labs  Lab 02/02/21 0806 02/03/21 0533  AST 81* 69*  ALT 107* 82*  ALKPHOS 311* 274*  BILITOT 2.7* 1.8*  PROT 6.4* 5.9*  ALBUMIN 3.8 3.0*    CBG: No results for input(s): GLUCAP in the last 168 hours.   Recent Results (from the past 240 hour(s))  Resp Panel by RT-PCR (Flu A&B, Covid) Nasopharyngeal Swab     Status: None   Collection Time: 02/02/21  9:22 AM   Specimen: Nasopharyngeal Swab; Nasopharyngeal(NP) swabs in vial transport medium  Result Value Ref Range Status   SARS Coronavirus 2 by RT PCR NEGATIVE NEGATIVE Final    Comment: (NOTE) SARS-CoV-2 target nucleic acids are NOT DETECTED.  The SARS-CoV-2 RNA is generally detectable in upper respiratory specimens during the acute phase of infection. The lowest concentration of SARS-CoV-2 viral copies this assay can detect is 138 copies/mL. A negative result does not preclude SARS-Cov-2 infection and should not be used as the sole basis for treatment or other patient management decisions. A negative result may occur with  improper specimen collection/handling, submission of specimen other than nasopharyngeal swab, presence of viral mutation(s) within the areas targeted by this assay, and inadequate number of viral copies(<138 copies/mL). A negative result must be combined with clinical observations, patient history, and epidemiological information. The expected result is Negative.  Fact Sheet for Patients:  EntrepreneurPulse.com.au  Fact Sheet for Healthcare Providers:   IncredibleEmployment.be  This test is no t yet approved or cleared by the [place] FDA and  has been authorized for detection and/or diagnosis of SARS-CoV-2 by FDA under an Emergency Use Authorization (EUA). This EUA will remain  in effect (meaning this test can be used) for the duration of the COVID-19 declaration under Section 564(b)(1) of the Act, 21 U.S.C.section 360bbb-3(b)(1), unless the authorization is terminated  or revoked sooner.       Influenza A by PCR NEGATIVE NEGATIVE Final   Influenza B by PCR NEGATIVE NEGATIVE Final    Comment: (NOTE) The Xpert  Xpress SARS-CoV-2/FLU/RSV plus assay is intended as an aid in the diagnosis of influenza from Nasopharyngeal swab specimens and should not be used as a sole basis for treatment. Nasal washings and aspirates are unacceptable for Xpert Xpress SARS-CoV-2/FLU/RSV testing.  Fact Sheet for Patients: EntrepreneurPulse.com.au  Fact Sheet for Healthcare Providers: IncredibleEmployment.be  This test is not yet approved or cleared by the [place] FDA and has been authorized for detection and/or diagnosis of SARS-CoV-2 by FDA under an Emergency Use Authorization (EUA). This EUA will remain in effect (meaning this test can be used) for the duration of the COVID-19 declaration under Section 564(b)(1) of the Act, 21 U.S.C. section 360bbb-3(b)(1), unless the authorization is terminated or revoked.  Performed at [facility], [address], [place], [place] [address]          Radiology Studies: CT ABDOMEN PELVIS W CONTRAST  Result Date: 02/02/2021 CLINICAL DATA:  Abdominal pain, acute, nonlocalized. No bowel movement for 3 days. EXAM: CT ABDOMEN AND PELVIS WITH CONTRAST TECHNIQUE: Multidetector CT imaging of the abdomen and pelvis was performed using the standard protocol following bolus administration of intravenous contrast. RADIATION DOSE  REDUCTION: This exam was performed according to the departmental dose-optimization program which includes automated exposure control, adjustment of the mA and/or kV according to patient size and/or use of iterative reconstruction technique. CONTRAST:  58mL OMNIPAQUE IOHEXOL 300 MG/ML  SOLN COMPARISON:  CT a of the abdomen and pelvis 11/27/2020 FINDINGS: Lower chest: Dependent atelectasis is present bilaterally. Lungs are otherwise clear. Heart size is within normal limits. Coronary artery calcifications are present. No significant pleural or pericardial effusion is present. Hepatobiliary: The common bile duct is upper limits of normal for age at 8 mm. No obstructing lesion is present. Gallbladder is distended without inflammatory change. Liver is unremarkable. Pancreas: Unremarkable. No pancreatic ductal dilatation or surrounding inflammatory changes. Spleen: Normal in size without focal abnormality. Adrenals/Urinary Tract: Adrenal glands are normal bilaterally. A 4 cm simple cyst at the upper pole of the left kidney is stable. No other significant renal lesions are present. No stone or mass lesion is present. Ureters are within normal limits. The urinary bladder is unremarkable. Stomach/Bowel: Stomach and duodenum are within normal limits. Small bowel is unremarkable. Terminal ileum is within normal limits. Appendix is visualized and normal. The ascending and transverse colon are within normal limits. Normal stool burden is present in the distal transverse colon. Descending and sigmoid colon are within normal limits. No obstruction inflammatory change is present. Vascular/Lymphatic: Atherosclerotic calcifications are present in the aorta and branch vessels without aneurysm. No significant adenopathy is present. Reproductive: Status post hysterectomy. No adnexal masses. Other: No abdominal wall hernia or abnormality. No abdominopelvic ascites. Musculoskeletal: Mild leftward curvature is centered at L3-4 with  associated degenerative endplate changes at F8-1, L4-5 and L5-S1. No focal osseous lesions are present. Vertebral body heights are maintained. IMPRESSION: 1. No acute or focal lesion to explain the patient's abdominal pain. 2. Stable simple cyst at the upper pole of the left kidney. 3. Aortic Atherosclerosis (ICD10-I70.0). Electronically Signed   By: San Morelle M.D.   On: 02/02/2021 09:25   MR 3D Recon At Scanner  Result Date: 02/03/2021 CLINICAL DATA:  Acute pancreatitis with biliary dilatation. EXAM: MRI ABDOMEN WITHOUT AND WITH CONTRAST (INCLUDING MRCP) TECHNIQUE: Multiplanar multisequence MR imaging of the abdomen was performed both before and after the administration of intravenous contrast. Heavily T2-weighted images of the biliary and pancreatic ducts were obtained, and three-dimensional MRCP images were rendered  by post processing. CONTRAST:  24mL GADAVIST GADOBUTROL 1 MMOL/ML IV SOLN COMPARISON:  CT scan 02/02/2021.  Ultrasound exam 02/02/2021. FINDINGS: Lower chest: Unremarkable. Hepatobiliary: Motion degraded assessment. No suspicious focal abnormality within the liver parenchyma although tiny or subtle lesions could be obscured by motion artifact. Gallbladder is distended without evidence of wall thickening or pericholecystic fluid/edema no substantial intrahepatic biliary duct dilatation. Common duct is nondilated. Common bile duct measures 7 mm diameter in the head of the pancreas, upper normal to borderline increased for patient age. No choledocholithiasis. No evidence for obstructing lesion at the distal common bile duct. Pancreas: Motion degraded assessment shows no suspicious mass lesion. There is no main duct dilatation. Spleen:  No splenomegaly. No focal mass lesion. Adrenals/Urinary Tract: No adrenal nodule or mass. Postcontrast imaging of the kidneys is motion degraded. Within this limitation, the right kidney is unremarkable. 4.5 cm simple cyst noted upper pole left kidney.  Stomach/Bowel: Stomach is unremarkable. No gastric wall thickening. No evidence of outlet obstruction. Duodenum is normally positioned as is the ligament of Treitz. No small bowel or colonic dilatation within the visualized abdomen. Vascular/Lymphatic: No abdominal aortic aneurysm. No abdominal lymphadenopathy. Other:  No intraperitoneal free fluid. Musculoskeletal: No focal suspicious marrow enhancement within the visualized bony anatomy. IMPRESSION: 1. Motion degraded assessment. Within this limitation no acute findings in the abdomen. 2. Distended gallbladder without gallstones or gallbladder wall thickening. No pericholecystic edema. 3. Common bile duct is upper normal to borderline increased for patient age at 7 mm diameter. No choledocholithiasis. No obstructing lesion at the distal common bile duct. 4. No MR evidence for pancreatitis. No main duct dilatation in the pancreas. 5. 4.5 cm simple cyst upper pole left kidney. Electronically Signed   By: Misty Stanley M.D.   On: 02/03/2021 07:19   MR ABDOMEN MRCP W WO CONTAST  Result Date: 02/03/2021 CLINICAL DATA:  Acute pancreatitis with biliary dilatation. EXAM: MRI ABDOMEN WITHOUT AND WITH CONTRAST (INCLUDING MRCP) TECHNIQUE: Multiplanar multisequence MR imaging of the abdomen was performed both before and after the administration of intravenous contrast. Heavily T2-weighted images of the biliary and pancreatic ducts were obtained, and three-dimensional MRCP images were rendered by post processing. CONTRAST:  77mL GADAVIST GADOBUTROL 1 MMOL/ML IV SOLN COMPARISON:  CT scan 02/02/2021.  Ultrasound exam 02/02/2021. FINDINGS: Lower chest: Unremarkable. Hepatobiliary: Motion degraded assessment. No suspicious focal abnormality within the liver parenchyma although tiny or subtle lesions could be obscured by motion artifact. Gallbladder is distended without evidence of wall thickening or pericholecystic fluid/edema no substantial intrahepatic biliary duct dilatation.  Common duct is nondilated. Common bile duct measures 7 mm diameter in the head of the pancreas, upper normal to borderline increased for patient age. No choledocholithiasis. No evidence for obstructing lesion at the distal common bile duct. Pancreas: Motion degraded assessment shows no suspicious mass lesion. There is no main duct dilatation. Spleen:  No splenomegaly. No focal mass lesion. Adrenals/Urinary Tract: No adrenal nodule or mass. Postcontrast imaging of the kidneys is motion degraded. Within this limitation, the right kidney is unremarkable. 4.5 cm simple cyst noted upper pole left kidney. Stomach/Bowel: Stomach is unremarkable. No gastric wall thickening. No evidence of outlet obstruction. Duodenum is normally positioned as is the ligament of Treitz. No small bowel or colonic dilatation within the visualized abdomen. Vascular/Lymphatic: No abdominal aortic aneurysm. No abdominal lymphadenopathy. Other:  No intraperitoneal free fluid. Musculoskeletal: No focal suspicious marrow enhancement within the visualized bony anatomy. IMPRESSION: 1. Motion degraded assessment. Within this limitation no acute findings  in the abdomen. 2. Distended gallbladder without gallstones or gallbladder wall thickening. No pericholecystic edema. 3. Common bile duct is upper normal to borderline increased for patient age at 7 mm diameter. No choledocholithiasis. No obstructing lesion at the distal common bile duct. 4. No MR evidence for pancreatitis. No main duct dilatation in the pancreas. 5. 4.5 cm simple cyst upper pole left kidney. Electronically Signed   By: Misty Stanley M.D.   On: 02/03/2021 07:19   US Abdomen Limited RUQ (LIVER/GB)  Result Date: 02/02/2021 CLINICAL DATA:  Elevated LFTs. EXAM: ULTRASOUND ABDOMEN LIMITED RIGHT UPPER QUADRANT COMPARISON:  CT abdomen pelvis from same day and November 27, 2020. FINDINGS: Gallbladder: Distended with small amount of sludge. No gallstones or wall thickening visualized. No  sonographic Murphy sign noted by sonographer. Common bile duct: Diameter: Mild dilatation of the mid and distal common bile duct up to 9 mm. Liver: Mild central intrahepatic biliary dilatation. No focal lesion identified. Within normal limits in parenchymal echogenicity. Portal vein is patent on color Doppler imaging with normal direction of blood flow towards the liver. Other: None. IMPRESSION: 1. Mild central intrahepatic and extrahepatic biliary dilatation without obvious obstructing stone or lesion, new since November. Given elevated LFTs, recommend ERCP for further evaluation. 2. Distended gallbladder with small amount of sludge. No sonographic evidence of acute cholecystitis. Electronically Signed   By: Titus Dubin M.D.   On: 02/02/2021 10:56        Scheduled Meds:  amLODipine  10 mg Oral Daily   carbamazepine  100 mg Oral BID   DULoxetine  30 mg Oral Daily   DULoxetine  60 mg Oral Daily   enoxaparin (LOVENOX) injection  40 mg Subcutaneous Q24H   fluticasone  2 spray Each Nare Daily   memantine  10 mg Oral BID   pantoprazole (PROTONIX) IV  40 mg Intravenous Q12H   propranolol  10 mg Oral BID   QUEtiapine  100 mg Oral QHS   umeclidinium bromide  1 puff Inhalation Daily   Continuous Infusions:  sodium chloride       LOS: 0 days   Greater than 50% of this time was spent in counseling, explanation of diagnosis, planning of further management, and coordination of care.   Voice Recognition Viviann Spare dictation system was used to create this note, attempts have been made to correct errors. Please contact the author with questions and/or clarifications.   Florencia Reasons, MD PhD FACP [facility]  Available via Epic secure chat 7am-7pm for nonurgent issues Please page for urgent issues To page the attending provider between 7A-7P or the covering provider during after hours 7P-7A, please log into the web site [email or web address] and access using universal [place] password for that web  site. If you do not have the password, please call the hospital operator.    02/03/2021, 1:27 PM

## 2021-02-03 NOTE — Progress Notes (Signed)
°   02/03/21 1200  Mobility  Activity Ambulated with assistance in hallway  Level of Assistance Standby assist, set-up cues, supervision of patient - no hands on  Assistive Device Other (Comment) (IV pole)  Distance Ambulated (ft) 400 ft  Activity Response Tolerated well  $Mobility charge 1 Mobility   Pt agreeable to mobilize this morning. Ambulated in the hall about 447ft with IV pole, tolerated well. No complaints. Pt very HOH. Left pt in bed with call bell at side.  [place] [facility]ist [facility] Office: [phone]

## 2021-02-03 NOTE — Progress Notes (Signed)
[facility] Gastroenterology Progress Note  Shelly Farrell 71 y.o. [birth year]  CC: Elevated LFTs   Subjective: Patient states she is doing well, no longer having any pain.  Denies nausea/vomiting.  Denies melena/hematochezia.  States she would like to eat.  ROS : Review of Systems  Unable to perform ROS: Dementia     Objective: Vital signs in last 24 hours: Vitals:   02/03/21 0759 02/03/21 0801  BP:    Pulse:    Resp:    Temp:    SpO2: 97% 97%    Physical Exam:  General:  Alert, cooperative, no distress, appears stated age  Head:  Normocephalic, without obvious abnormality, atraumatic  Eyes:  Anicteric sclera, EOM's intact  Lungs:   Clear to auscultation bilaterally, respirations unlabored  Heart:  Regular rate and rhythm, S1, S2 normal  Abdomen:   Soft, non-tender, bowel sounds active all four quadrants,  no masses,     Lab Results: Recent Labs    02/02/21 0806 02/03/21 0533  NA 137 138  K 3.6 3.5  CL 106 109  CO2 21* 20*  GLUCOSE 106* 75  BUN 12 15  CREATININE 0.75 0.65  CALCIUM 9.0 8.7*   Recent Labs    02/02/21 0806 02/03/21 0533  AST 81* 69*  ALT 107* 82*  ALKPHOS 311* 274*  BILITOT 2.7* 1.8*  PROT 6.4* 5.9*  ALBUMIN 3.8 3.0*   Recent Labs    02/02/21 0806 02/03/21 0533  WBC 8.0 5.0  NEUTROABS 6.2  --   HGB 12.1 10.6*  HCT 37.0 32.6*  MCV 92.0 94.5  PLT 173 153   Recent Labs    02/03/21 0533  LABPROT 13.9  INR 1.1      Assessment Gallstone pancreatitis, elevated LFTs; -AST 69/ALT 82/alk phos 274 (improving) -T bili 1.8 (improving, 2.7 yesterday) - Normal renal function - No leukocytosis - Lipase 453 - US abdomen RUQ 1/31: Mild intrahepatic and extrahepatic biliary dilation without obstructing stone.  Distended gallbladder with small amount of sludge. -MRCP 2/1: Distended gallbladder without gallstones.  Common bile duct upper normal (7 mm), no choledocholithiasis.  No MR evidence of pancreatitis.  No pancreatic ductal  dilation.   Alzheimer's   Plan: LFTs are improving.  Patient is improving clinically.  MRI negative for choledocholithiasis.  Patient likely passed gallstone.  No need for ERCP at this time. Continue to trend LFTs to normalization.  If LFTs begin to increase again, can consider ERCP Continue supportive care Soft diet as tolerated [facility] will follow  Garnette Scheuermann PA-C 02/03/2021, 11:37 AM  Contact #  [phone]

## 2021-02-04 DIAGNOSIS — R7989 Other specified abnormal findings of blood chemistry: Secondary | ICD-10-CM | POA: Diagnosis not present

## 2021-02-04 LAB — COMPREHENSIVE METABOLIC PANEL
ALT: 63 U/L — ABNORMAL HIGH (ref 0–44)
AST: 48 U/L — ABNORMAL HIGH (ref 15–41)
Albumin: 2.9 g/dL — ABNORMAL LOW (ref 3.5–5.0)
Alkaline Phosphatase: 274 U/L — ABNORMAL HIGH (ref 38–126)
Anion gap: 7 (ref 5–15)
BUN: 12 mg/dL (ref 8–23)
CO2: 23 mmol/L (ref 22–32)
Calcium: 8.1 mg/dL — ABNORMAL LOW (ref 8.9–10.3)
Chloride: 105 mmol/L (ref 98–111)
Creatinine, Ser: 0.78 mg/dL (ref 0.44–1.00)
GFR, Estimated: >60 mL/min (ref >60)
Glucose, Bld: 91 mg/dL (ref 70–99)
Potassium: 3.4 mmol/L — ABNORMAL LOW (ref 3.5–5.1)
Sodium: 135 mmol/L (ref 135–145)
Total Bilirubin: 1.1 mg/dL (ref 0.3–1.2)
Total Protein: 5.5 g/dL — ABNORMAL LOW (ref 6.5–8.1)

## 2021-02-04 LAB — MAGNESIUM: Magnesium: 1.9 mg/dL (ref 1.7–2.4)

## 2021-02-04 MED ORDER — ROSUVASTATIN CALCIUM 20 MG PO TABS: 20.0000 mg | ORAL_TABLET | Freq: Every day | ORAL | Status: AC

## 2021-02-04 MED ORDER — POTASSIUM CHLORIDE CRYS ER 20 MEQ PO TBCR
40.0000 meq | EXTENDED_RELEASE_TABLET | Freq: Once | ORAL | Status: AC
  Administered 2021-02-04: 40 meq via ORAL
  Filled 2021-02-04: qty 2

## 2021-02-04 MED ORDER — POTASSIUM CHLORIDE 20 MEQ PO PACK
40.0000 meq | PACK | Freq: Once | ORAL | Status: AC
  Administered 2021-02-04: 40 meq via ORAL
  Filled 2021-02-04: qty 2

## 2021-02-04 MED ORDER — LOSARTAN POTASSIUM 50 MG PO TABS: 50.0000 mg | ORAL_TABLET | Freq: Every day | ORAL | Status: AC

## 2021-02-04 NOTE — Discharge Summary (Signed)
Discharge Summary  Shelly Farrell QHU:[id number] DOB: [birth year]  PCP: [facility]  Admit date: 02/02/2021 Discharge date: 02/04/2021  Time spent: 85mins  Recommendations for Outpatient Follow-up:  F/u with PCP within a week  for hospital discharge follow up, repeat cbc/cmp at follow up, patient is advised to check blood pressure at home, bring in record for pcp to review, pcp to decide on when to resume cozaar. Pcp to monitor liver function,pcp to decide when to resume crestor  F/[facility] with [facility] for gallbladder /gallstone      Discharge Diagnoses:  Active Hospital Problems   Diagnosis Date Noted   Abnormal liver function tests 02/02/2021   Cognitive decline 03/11/2019   Cerebral amyloid angiopathy (HCC) 03/11/2019   GERD (gastroesophageal reflux disease)    Hypertension     Resolved Hospital Problems  No resolved problems to display.    Discharge Condition: stable  Diet recommendation: soft diet, low fat diet   Filed Weights   02/02/21 0735 02/04/21 0459  Weight: 58.3 kg 59 kg    History of present illness: ( per admitting MD Dr Louanne Belton ) Chief Complaint: Abdominal pain   HPI: Shelly Farrell is a 71 y.o. female with past medical history of cerebral contusion, subarachnoid hemorrhage, traumatic brain injury, expressive aphasia, history of amyloid angiopathy, GERD, hyperlipidemia, cognitive decline, hypertension, presented to the [facility] ED with abdominal pain as reported by her daughter.  Patient was in having intermittent abdominal pain colicky in nature since Sunday but got worse for 1 day.  Patient has not had a bowel movement in 2 to 3 days and her daughter was giving her laxatives without any meaningful response.  Patient was wincing in pain because of abdominal pain at home.  There was no report of nausea vomiting or decreased oral intake but no fever or chills.  No fever chills or urinary symptoms.  No report of shortness  of breath cough or sputum production.   ED Course:  In the ED, patient was disoriented at baseline.  Lipase was elevated at 453 with a bilirubin of 2.7 and elevated AST and ALT.  LFTs were normal 2 months back.  CT scan of the abdomen in the ED was nonspecific including ultrasound right upper quadrant.  ED provider had spoken with Dr. Watt Climes who recommended transfer to the hospital for possible MRCP.  Hospital Course:  Principal Problem:   Abnormal liver function tests Active Problems:   Hypertension   GERD (gastroesophageal reflux disease)   Cerebral amyloid angiopathy (HCC)   Cognitive decline   Possible gallstone pancreatitis/passed gallstone -Present with RUQ ab pain and elevation of lipase and LFT -Pain has resolved, lipase normalized, LFT improved, MRI negative choledochal lithiasis -Seen by GI, cleared to discharge home, family prefers to [facility], [facility] aware     hypertension BP low normal initially Norvasc and cozaar held in the hospital Bp improved at discharge, Continue propranolol, resume Norvasc  Continue hold  Cozaar, patient is advised to check blood pressure at home and follow up with pcp next week, pcp to decide when to resume cozaar    Amyloid angiopathy/dementia/expressive aphasia Daughter reports patient is at baseline Daughter states patient has supervision at home    Consultations: [facility]  Dr Watt Climes   Discharge Exam: BP 136/63 (BP Location: Right Arm)    Pulse 65    Temp 99.9 F (37.7 C) (Oral)    Resp 16    Ht  5\' 4"  (1.626 m)    Wt 59 kg    SpO2 95%    BMI 22.33 kg/m   General: NAD, pleasantly, aaox2, not oriented to time Cardiovascular: RRR Respiratory: normal respiratory effort     Discharge Instructions     Diet general   Complete by: As directed    Soft diet, low fat.   Increase activity slowly   Complete by: As directed       Allergies as of 02/04/2021       Reactions   Lisinopril Swelling   Angioedema    Donepezil    Other reaction(s): Agitation nightmares        Medication List     STOP taking these medications    simvastatin 40 MG tablet Commonly known as: ZOCOR       TAKE these medications    amLODipine 10 MG tablet Commonly known as: NORVASC Take 10 mg by mouth daily.   ascorbic Acid 500 MG Cpcr Commonly known as: VITAMIN C Take 1 capsule by mouth daily.   busPIRone 5 MG tablet Commonly known as: BUSPAR Take 5 mg by mouth 2 (two) times daily as needed for anxiety.   butalbital-acetaminophen-caffeine 50-325-40 MG tablet Commonly known as: FIORICET Take 1 tablet by mouth every 6 (six) hours as needed for headache.   carbamazepine 100 MG 12 hr tablet Commonly known as: TEGRETOL XR Take 100 mg by mouth 2 (two) times daily.   cholecalciferol 1000 units tablet Commonly known as: VITAMIN D Take 1,000 Units by mouth daily.   DULoxetine 60 MG capsule Commonly known as: CYMBALTA Take 60 mg by mouth daily.   DULoxetine 30 MG capsule Commonly known as: CYMBALTA Take 30 mg by mouth daily.   fluticasone 50 MCG/ACT nasal spray Commonly known as: FLONASE Place 2 sprays into both nostrils at bedtime.   losartan 50 MG tablet Commonly known as: COZAAR Take 1 tablet (50 mg total) by mouth daily. Please hold this medication, please check your blood pressure at home , bring blood pressure record to your pcp, your pcp to decide when to resume this medication What changed: additional instructions   meclizine 25 MG tablet Commonly known as: ANTIVERT Take 25 mg by mouth 2 (two) times daily as needed for dizziness.   memantine 10 MG tablet Commonly known as: NAMENDA Take 10 mg by mouth 2 (two) times daily.   multivitamin with minerals Tabs tablet Take 1 tablet by mouth daily.   omeprazole 20 MG capsule Commonly known as: PRILOSEC Take 20 mg by mouth daily.   ondansetron 4 MG tablet Commonly known as: ZOFRAN Take 4 mg by mouth every 8 (eight) hours as needed for  nausea/vomiting.   propranolol 10 MG tablet Commonly known as: INDERAL Take 10 mg by mouth 2 (two) times daily.   QUEtiapine 100 MG tablet Commonly known as: SEROQUEL Take 100 mg by mouth at bedtime.   rosuvastatin 20 MG tablet Commonly known as: CRESTOR Take 1 tablet (20 mg total) by mouth at bedtime. Please hold this medication until liver function come back to normal, please follow up with your pcp to check your liver function and your pcp to decide when to resume this medication What changed: additional instructions   Spiriva Respimat 1.25 MCG/ACT Aers Generic drug: Tiotropium Bromide Monohydrate Take 2 puffs by mouth daily.       Allergies  Allergen Reactions   Lisinopril Swelling    Angioedema   Donepezil     Other reaction(s): Agitation  nightmares    Follow-up Information     [facility] Follow up in 1 week(s).   Specialty: Family Medicine Why: hospital discharge follow up, repeat cbc/cmp including liver function. pcp to decide on when to resume crestor . please check your blood pressure at home, bring in blood pressure record for you pcp to review, pcp to decide on when to resume cozaar Contact information: [place] [address] [phone]         Clarene Essex, MD Follow up.   Specialty: Gastroenterology Why: for gallbladder Contact information: [address] [place] [address] [phone]                  The results of significant diagnostics from this hospitalization (including imaging, microbiology, ancillary and laboratory) are listed below for reference.    Significant Diagnostic Studies: CT ABDOMEN PELVIS W CONTRAST  Result Date: 02/02/2021 CLINICAL DATA:  Abdominal pain, acute, nonlocalized. No bowel movement for 3 days. EXAM: CT ABDOMEN AND PELVIS WITH CONTRAST TECHNIQUE: Multidetector CT imaging of the abdomen and pelvis was performed using the standard  protocol following bolus administration of intravenous contrast. RADIATION DOSE REDUCTION: This exam was performed according to the departmental dose-optimization program which includes automated exposure control, adjustment of the mA and/or kV according to patient size and/or use of iterative reconstruction technique. CONTRAST:  60mL OMNIPAQUE IOHEXOL 300 MG/ML  SOLN COMPARISON:  CT a of the abdomen and pelvis 11/27/2020 FINDINGS: Lower chest: Dependent atelectasis is present bilaterally. Lungs are otherwise clear. Heart size is within normal limits. Coronary artery calcifications are present. No significant pleural or pericardial effusion is present. Hepatobiliary: The common bile duct is upper limits of normal for age at 8 mm. No obstructing lesion is present. Gallbladder is distended without inflammatory change. Liver is unremarkable. Pancreas: Unremarkable. No pancreatic ductal dilatation or surrounding inflammatory changes. Spleen: Normal in size without focal abnormality. Adrenals/Urinary Tract: Adrenal glands are normal bilaterally. A 4 cm simple cyst at the upper pole of the left kidney is stable. No other significant renal lesions are present. No stone or mass lesion is present. Ureters are within normal limits. The urinary bladder is unremarkable. Stomach/Bowel: Stomach and duodenum are within normal limits. Small bowel is unremarkable. Terminal ileum is within normal limits. Appendix is visualized and normal. The ascending and transverse colon are within normal limits. Normal stool burden is present in the distal transverse colon. Descending and sigmoid colon are within normal limits. No obstruction inflammatory change is present. Vascular/Lymphatic: Atherosclerotic calcifications are present in the aorta and branch vessels without aneurysm. No significant adenopathy is present. Reproductive: Status post hysterectomy. No adnexal masses. Other: No abdominal wall hernia or abnormality. No abdominopelvic  ascites. Musculoskeletal: Mild leftward curvature is centered at L3-4 with associated degenerative endplate changes at P3-7, L4-5 and L5-S1. No focal osseous lesions are present. Vertebral body heights are maintained. IMPRESSION: 1. No acute or focal lesion to explain the patient's abdominal pain. 2. Stable simple cyst at the upper pole of the left kidney. 3. Aortic Atherosclerosis (ICD10-I70.0). Electronically Signed   By: San Morelle M.D.   On: 02/02/2021 09:25   MR 3D Recon At Scanner  Result Date: 02/03/2021 CLINICAL DATA:  Acute pancreatitis with biliary dilatation. EXAM: MRI ABDOMEN WITHOUT AND WITH CONTRAST (INCLUDING MRCP) TECHNIQUE: Multiplanar multisequence MR imaging of the abdomen was performed both before and after the administration of intravenous contrast. Heavily T2-weighted images of the biliary  and pancreatic ducts were obtained, and three-dimensional MRCP images were rendered by post processing. CONTRAST:  11mL GADAVIST GADOBUTROL 1 MMOL/ML IV SOLN COMPARISON:  CT scan 02/02/2021.  Ultrasound exam 02/02/2021. FINDINGS: Lower chest: Unremarkable. Hepatobiliary: Motion degraded assessment. No suspicious focal abnormality within the liver parenchyma although tiny or subtle lesions could be obscured by motion artifact. Gallbladder is distended without evidence of wall thickening or pericholecystic fluid/edema no substantial intrahepatic biliary duct dilatation. Common duct is nondilated. Common bile duct measures 7 mm diameter in the head of the pancreas, upper normal to borderline increased for patient age. No choledocholithiasis. No evidence for obstructing lesion at the distal common bile duct. Pancreas: Motion degraded assessment shows no suspicious mass lesion. There is no main duct dilatation. Spleen:  No splenomegaly. No focal mass lesion. Adrenals/Urinary Tract: No adrenal nodule or mass. Postcontrast imaging of the kidneys is motion degraded. Within this limitation, the right  kidney is unremarkable. 4.5 cm simple cyst noted upper pole left kidney. Stomach/Bowel: Stomach is unremarkable. No gastric wall thickening. No evidence of outlet obstruction. Duodenum is normally positioned as is the ligament of Treitz. No small bowel or colonic dilatation within the visualized abdomen. Vascular/Lymphatic: No abdominal aortic aneurysm. No abdominal lymphadenopathy. Other:  No intraperitoneal free fluid. Musculoskeletal: No focal suspicious marrow enhancement within the visualized bony anatomy. IMPRESSION: 1. Motion degraded assessment. Within this limitation no acute findings in the abdomen. 2. Distended gallbladder without gallstones or gallbladder wall thickening. No pericholecystic edema. 3. Common bile duct is upper normal to borderline increased for patient age at 7 mm diameter. No choledocholithiasis. No obstructing lesion at the distal common bile duct. 4. No MR evidence for pancreatitis. No main duct dilatation in the pancreas. 5. 4.5 cm simple cyst upper pole left kidney. Electronically Signed   By: Misty Stanley M.D.   On: 02/03/2021 07:19   MR ABDOMEN MRCP W WO CONTAST  Result Date: 02/03/2021 CLINICAL DATA:  Acute pancreatitis with biliary dilatation. EXAM: MRI ABDOMEN WITHOUT AND WITH CONTRAST (INCLUDING MRCP) TECHNIQUE: Multiplanar multisequence MR imaging of the abdomen was performed both before and after the administration of intravenous contrast. Heavily T2-weighted images of the biliary and pancreatic ducts were obtained, and three-dimensional MRCP images were rendered by post processing. CONTRAST:  51mL GADAVIST GADOBUTROL 1 MMOL/ML IV SOLN COMPARISON:  CT scan 02/02/2021.  Ultrasound exam 02/02/2021. FINDINGS: Lower chest: Unremarkable. Hepatobiliary: Motion degraded assessment. No suspicious focal abnormality within the liver parenchyma although tiny or subtle lesions could be obscured by motion artifact. Gallbladder is distended without evidence of wall thickening or  pericholecystic fluid/edema no substantial intrahepatic biliary duct dilatation. Common duct is nondilated. Common bile duct measures 7 mm diameter in the head of the pancreas, upper normal to borderline increased for patient age. No choledocholithiasis. No evidence for obstructing lesion at the distal common bile duct. Pancreas: Motion degraded assessment shows no suspicious mass lesion. There is no main duct dilatation. Spleen:  No splenomegaly. No focal mass lesion. Adrenals/Urinary Tract: No adrenal nodule or mass. Postcontrast imaging of the kidneys is motion degraded. Within this limitation, the right kidney is unremarkable. 4.5 cm simple cyst noted upper pole left kidney. Stomach/Bowel: Stomach is unremarkable. No gastric wall thickening. No evidence of outlet obstruction. Duodenum is normally positioned as is the ligament of Treitz. No small bowel or colonic dilatation within the visualized abdomen. Vascular/Lymphatic: No abdominal aortic aneurysm. No abdominal lymphadenopathy. Other:  No intraperitoneal free fluid. Musculoskeletal: No focal suspicious marrow enhancement within the visualized bony anatomy.  IMPRESSION: 1. Motion degraded assessment. Within this limitation no acute findings in the abdomen. 2. Distended gallbladder without gallstones or gallbladder wall thickening. No pericholecystic edema. 3. Common bile duct is upper normal to borderline increased for patient age at 7 mm diameter. No choledocholithiasis. No obstructing lesion at the distal common bile duct. 4. No MR evidence for pancreatitis. No main duct dilatation in the pancreas. 5. 4.5 cm simple cyst upper pole left kidney. Electronically Signed   By: Misty Stanley M.D.   On: 02/03/2021 07:19   US Abdomen Limited RUQ (LIVER/GB)  Result Date: 02/02/2021 CLINICAL DATA:  Elevated LFTs. EXAM: ULTRASOUND ABDOMEN LIMITED RIGHT UPPER QUADRANT COMPARISON:  CT abdomen pelvis from same day and November 27, 2020. FINDINGS: Gallbladder: Distended  with small amount of sludge. No gallstones or wall thickening visualized. No sonographic Murphy sign noted by sonographer. Common bile duct: Diameter: Mild dilatation of the mid and distal common bile duct up to 9 mm. Liver: Mild central intrahepatic biliary dilatation. No focal lesion identified. Within normal limits in parenchymal echogenicity. Portal vein is patent on color Doppler imaging with normal direction of blood flow towards the liver. Other: None. IMPRESSION: 1. Mild central intrahepatic and extrahepatic biliary dilatation without obvious obstructing stone or lesion, new since November. Given elevated LFTs, recommend ERCP for further evaluation. 2. Distended gallbladder with small amount of sludge. No sonographic evidence of acute cholecystitis. Electronically Signed   By: Titus Dubin M.D.   On: 02/02/2021 10:56    Microbiology: Recent Results (from the past 240 hour(s))  Resp Panel by RT-PCR (Flu A&B, Covid) Nasopharyngeal Swab     Status: None   Collection Time: 02/02/21  9:22 AM   Specimen: Nasopharyngeal Swab; Nasopharyngeal(NP) swabs in vial transport medium  Result Value Ref Range Status   SARS Coronavirus 2 by RT PCR NEGATIVE NEGATIVE Final    Comment: (NOTE) SARS-CoV-2 target nucleic acids are NOT DETECTED.  The SARS-CoV-2 RNA is generally detectable in upper respiratory specimens during the acute phase of infection. The lowest concentration of SARS-CoV-2 viral copies this assay can detect is 138 copies/mL. A negative result does not preclude SARS-Cov-2 infection and should not be used as the sole basis for treatment or other patient management decisions. A negative result may occur with  improper specimen collection/handling, submission of specimen other than nasopharyngeal swab, presence of viral mutation(s) within the areas targeted by this assay, and inadequate number of viral copies(<138 copies/mL). A negative result must be combined with clinical observations,  patient history, and epidemiological information. The expected result is Negative.  Fact Sheet for Patients:  EntrepreneurPulse.com.au  Fact Sheet for Healthcare Providers:  IncredibleEmployment.be  This test is no t yet approved or cleared by the [place] FDA and  has been authorized for detection and/or diagnosis of SARS-CoV-2 by FDA under an Emergency Use Authorization (EUA). This EUA will remain  in effect (meaning this test can be used) for the duration of the COVID-19 declaration under Section 564(b)(1) of the Act, 21 U.S.C.section 360bbb-3(b)(1), unless the authorization is terminated  or revoked sooner.       Influenza A by PCR NEGATIVE NEGATIVE Final   Influenza B by PCR NEGATIVE NEGATIVE Final    Comment: (NOTE) The Xpert Xpress SARS-CoV-2/FLU/RSV plus assay is intended as an aid in the diagnosis of influenza from Nasopharyngeal swab specimens and should not be used as a sole basis for treatment. Nasal washings and aspirates are unacceptable for Xpert Xpress SARS-CoV-2/FLU/RSV testing.  Fact Sheet for Patients:  EntrepreneurPulse.com.au  Fact Sheet for Healthcare Providers: IncredibleEmployment.be  This test is not yet approved or cleared by the [place] FDA and has been authorized for detection and/or diagnosis of SARS-CoV-2 by FDA under an Emergency Use Authorization (EUA). This EUA will remain in effect (meaning this test can be used) for the duration of the COVID-19 declaration under Section 564(b)(1) of the Act, 21 U.S.C. section 360bbb-3(b)(1), unless the authorization is terminated or revoked.  Performed at [facility], [address] [place], [place] [address]      Labs: Basic Metabolic Panel: Recent Labs  Lab 02/02/21 0806 02/03/21 0533 02/04/21 0541  NA 137 138 135  K 3.6 3.5 3.4*  CL 106 109 105  CO2 21* 20* 23  GLUCOSE 106* 75 91  BUN 12  15 12   CREATININE 0.75 0.65 0.78  CALCIUM 9.0 8.7* 8.1*  MG  --   --  1.9   Liver Function Tests: Recent Labs  Lab 02/02/21 0806 02/03/21 0533 02/04/21 0541  AST 81* 69* 48*  ALT 107* 82* 63*  ALKPHOS 311* 274* 274*  BILITOT 2.7* 1.8* 1.1  PROT 6.4* 5.9* 5.5*  ALBUMIN 3.8 3.0* 2.9*   Recent Labs  Lab 02/02/21 0806 02/03/21 0533  LIPASE 453* 34   No results for input(s): AMMONIA in the last 168 hours. CBC: Recent Labs  Lab 02/02/21 0806 02/03/21 0533 02/03/21 1346  WBC 8.0 5.0 6.1  NEUTROABS 6.2  --  3.9  HGB 12.1 10.6* 10.9*  HCT 37.0 32.6* 32.6*  MCV 92.0 94.5 93.9  PLT 173 153 163   Cardiac Enzymes: No results for input(s): CKTOTAL, CKMB, CKMBINDEX, TROPONINI in the last 168 hours. BNP: BNP (last 3 results) No results for input(s): BNP in the last 8760 hours.  ProBNP (last 3 results) No results for input(s): PROBNP in the last 8760 hours.  CBG: No results for input(s): GLUCAP in the last 168 hours.  FURTHER DISCHARGE INSTRUCTIONS:   Get Medicines reviewed and adjusted: Please take all your medications with you for your next visit with your Primary MD   Laboratory/radiological data: Please request your Primary MD to go over all hospital tests and procedure/radiological results at the follow up, please ask your Primary MD to get all Hospital records sent to his/her office.   In some cases, they will be blood work, cultures and biopsy results pending at the time of your discharge. Please request that your primary care M.D. goes through all the records of your hospital data and follows up on these results.   Also Note the following: If you experience worsening of your admission symptoms, develop shortness of breath, life threatening emergency, suicidal or homicidal thoughts you must seek medical attention immediately by calling 911 or calling your MD immediately  if symptoms less severe.   You must read complete instructions/literature along with all the  possible adverse reactions/side effects for all the Medicines you take and that have been prescribed to you. Take any new Medicines after you have completely understood and accpet all the possible adverse reactions/side effects.    Do not drive when taking Pain medications or sleeping medications (Benzodaizepines)   Do not take more than prescribed Pain, Sleep and Anxiety Medications. It is not advisable to combine anxiety,sleep and pain medications without talking with your primary care practitioner   Special Instructions: If you have smoked or chewed Tobacco  in the last 2 yrs please stop smoking, stop any regular Alcohol  and or any Recreational drug  use.   Wear Seat belts while driving.   Please note: You were cared for by a hospitalist during your hospital stay. Once you are discharged, your primary care physician will handle any further medical issues. Please note that NO REFILLS for any discharge medications will be authorized once you are discharged, as it is imperative that you return to your primary care physician (or establish a relationship with a primary care physician if you do not have one) for your post hospital discharge needs so that they can reassess your need for medications and monitor your lab values.     Signed:  Florencia Reasons MD, PhD, FACP  [facility] 02/04/2021, 11:24 AM

## 2021-02-04 NOTE — Discharge Planning (Signed)
0700-12:00 Patient discharged and medications and instructions went over with patient. Patients son picked her up and instructions went over with son as well. Patients two IV's taken out and patient wheeled downstairs for transport.

## 2021-02-04 NOTE — Progress Notes (Signed)
[facility] Progress Note  Shelly Farrell 71 y.o. Jan 26, 1950  CC:  Elevated LFTs   Subjective: Patient doing well, tolerating diet well.  Denies nausea/vomiting.  ROS : Review of Systems  Unable to perform ROS: Dementia     Objective: Vital signs in last 24 hours: Vitals:   02/04/21 0459 02/04/21 0731  BP: 136/63   Pulse: 65   Resp: 16   Temp: 99.9 F (37.7 C)   SpO2: 95% 95%    Physical Exam:  General:  Alert, cooperative, no distress, appears stated age  Head:  Normocephalic, without obvious abnormality, atraumatic  Eyes:  Anicteric sclera, EOM's intact  Lungs:   Clear to auscultation bilaterally, respirations unlabored  Heart:  Regular rate and rhythm, S1, S2 normal  Abdomen:   Soft, non-tender, bowel sounds active all four quadrants,  no masses,     Lab Results: Recent Labs    02/03/21 0533 02/04/21 0541  NA 138 135  K 3.5 3.4*  CL 109 105  CO2 20* 23  GLUCOSE 75 91  BUN 15 12  CREATININE 0.65 0.78  CALCIUM 8.7* 8.1*  MG  --  1.9   Recent Labs    02/03/21 0533 02/04/21 0541  AST 69* 48*  ALT 82* 63*  ALKPHOS 274* 274*  BILITOT 1.8* 1.1  PROT 5.9* 5.5*  ALBUMIN 3.0* 2.9*   Recent Labs    02/02/21 0806 02/03/21 0533 02/03/21 1346  WBC 8.0 5.0 6.1  NEUTROABS 6.2  --  3.9  HGB 12.1 10.6* 10.9*  HCT 37.0 32.6* 32.6*  MCV 92.0 94.5 93.9  PLT 173 153 163   Recent Labs    02/03/21 0533  LABPROT 13.9  INR 1.1      Assessment Gallstone pancreatitis, elevated LFTs; -AST 48/ALT 63/alk phos 274 (improving) -T bili 1.1 (improving, 1.8 yesterday) - Normal renal function - No leukocytosis - Lipase 453 - US abdomen RUQ 1/31: Mild intrahepatic and extrahepatic biliary dilation without obstructing stone.  Distended gallbladder with small amount of sludge. -MRCP 2/1: Distended gallbladder without gallstones.  Common bile duct upper normal (7 mm), no choledocholithiasis.  No MR evidence of pancreatitis.  No pancreatic ductal  dilation.   Alzheimer's   Plan: LFTs continue to trend towards normalization.  Patient tolerating diet well and not having any pain.  From GI standpoint, suspect stone had passed.  Can discharge at this point with outpatient follow-up per primary care to recheck LFTs. [facility] will sign off. Please contact us if we can be of any further assistance during this hospital stay.   Garnette Scheuermann PA-C 02/04/2021, 11:38 AM  Contact #  [phone]

## 2021-02-04 NOTE — Evaluation (Signed)
Physical Therapy Evaluation Patient Details Name: Shelly Farrell MRN: [id number] DOB: 05/31/1950 Today's Date: 02/04/2021  History of Present Illness  71 y.o. female with past medical history of cerebral contusion, subarachnoid hemorrhage, traumatic brain injury, expressive aphasia, history of amyloid angiopathy, cognitive decline, hypertension, admitted 02/02/21 with abdominal pain as reported by her daughter (pt has dementia at baseline). Dx of abnormal liver function tests.  Clinical Impression  Pt ambulated 400' without an assistive device without loss of balance. She is mobilizing well. 24* supervision recommended due to pt's dementia and h/o falls.  Pt's son raised concerns that pt may not be received appropriate care at home (see home living section below for more details), TOC notified.       Recommendations for follow up therapy are one component of a multi-disciplinary discharge planning process, led by the attending physician.  Recommendations may be updated based on patient status, additional functional criteria and insurance authorization.  Follow Up Recommendations      Assistance Recommended at Discharge Set up Supervision/Assistance  Patient can return home with the following  A little help with walking and/or transfers;Direct supervision/assist for financial management;Assist for transportation;Direct supervision/assist for medications management;A little help with bathing/dressing/bathroom    Equipment Recommendations None recommended by PT  Recommendations for Other Services       Functional Status Assessment Patient has not had a recent decline in their functional status     Precautions / Restrictions Precautions Precautions: Fall Precaution Comments: son reports pt has had multiple falls over the past few years (one severe resulting in [facility]) but doesn't know exactly how many in past 6 months since he doesn't live with her Restrictions Weight Bearing  Restrictions: No      Mobility  Bed Mobility Overal bed mobility: Independent                  Transfers Overall transfer level: Independent                      Ambulation/Gait Ambulation/Gait assistance: Supervision Gait Distance (Feet): 400 Feet Assistive device: None Gait Pattern/deviations: Decreased stride length, Step-through pattern Gait velocity: WFL     General Gait Details: steady, no loss of balance, supervision for safety 2* dementia  Stairs            Wheelchair Mobility    Modified Rankin (Stroke Patients Only)       Balance Overall balance assessment: History of Falls, Needs assistance   Sitting balance-[name] Scale: Good       Standing balance-[name] Scale: Good                               Pertinent Vitals/Pain Pain Assessment Pain Assessment: Faces Pain Score: 0-No pain Faces Pain Scale: No hurt Pain Location: pt denied pain    Home Living Family/patient expects to be discharged to:: Private residence Living Arrangements: Spouse/significant other;Children Available Help at Discharge: Family;Available PRN/intermittently   Home Access: Stairs to enter Entrance Stairs-Rails: Right;Left Entrance Stairs-Number of Steps: 4   Home Layout: One level   Additional Comments: information obtained by pt's son, pt not oriented so not a reliable historian. Son reports pt lives with spouse and daughter, he stated spouse is frequently gone at work, and daughter sometimes "disappears on binges for several days or gets locked up" so pt is not receiving 24* supervision. He expressed concern that she's may not receive appropriate level of  nutrition/hydration. TOC notified of these concerns.    Prior Function Prior Level of Function : Independent/Modified Independent             Mobility Comments: walks without a device per son       Hand Dominance        Extremity/Trunk Assessment   Upper Extremity  Assessment Upper Extremity Assessment: Overall WFL for tasks assessed    Lower Extremity Assessment Lower Extremity Assessment: Overall WFL for tasks assessed    Cervical / Trunk Assessment Cervical / Trunk Assessment: Normal  Communication   Communication: HOH  Cognition Arousal/Alertness: Awake/alert Behavior During Therapy: WFL for tasks assessed/performed Overall Cognitive Status: History of cognitive impairments - at baseline                                 General Comments: oriented to self only, h/o dementia        General Comments      Exercises     Assessment/Plan    PT Assessment Patient does not need any further PT services  PT Problem List         PT Treatment Interventions      PT Goals (Current goals can be found in the Care Plan section)  Acute Rehab PT Goals PT Goal Formulation: All assessment and education complete, DC therapy    Frequency       Co-evaluation               AM-PAC PT "6 Clicks" Mobility  Outcome Measure Help needed turning from your back to your side while in a flat bed without using bedrails?: None Help needed moving from lying on your back to sitting on the side of a flat bed without using bedrails?: None Help needed moving to and from a bed to a chair (including a wheelchair)?: None Help needed standing up from a chair using your arms (e.g., wheelchair or bedside chair)?: None Help needed to walk in hospital room?: None Help needed climbing 3-5 steps with a railing? : A Little 6 Click Score: 23    End of Session Equipment Utilized During Treatment: Gait belt Activity Tolerance: Patient tolerated treatment well Patient left: in chair;with call bell/phone within reach;with chair alarm set;with nursing/sitter in room;with family/visitor present Nurse Communication: Mobility status PT Visit Diagnosis: History of falling (Z91.81)    Time: 5329-9242 PT Time Calculation (min) (ACUTE ONLY): 24  min   Charges:   PT Evaluation $PT Eval Moderate Complexity: 1 Mod PT Treatments $Gait Training: 8-22 mins       Blondell Reveal Kistler PT 02/04/2021  Acute Rehabilitation Services Pager [phone] Office [phone]

## 2021-04-05 ENCOUNTER — Encounter: Payer: Self-pay | Admitting: Physician Assistant

## 2021-04-13 ENCOUNTER — Telehealth: Payer: Self-pay | Admitting: Physician Assistant

## 2021-04-13 NOTE — Telephone Encounter (Signed)
Shelly Farrell,  ? ?Patient was seen at [facility] ED in January by an Reed and was to follow up with them. But patient was discharged from [facility] and have not seen them. I was unaware of this information when scheduled patient an appt 4/21 with you. Can you advise on scheduling patient or patient can not be seen here? ? ? ? ?Thank you ?

## 2021-04-22 NOTE — Progress Notes (Deleted)
04/22/2021 Shelly Farrell [id number] 01-06-1950  Referring provider: Medicine, [place]* Primary GI doctor: {acdocs:27040}  ASSESSMENT AND PLAN:   There are no diagnoses linked to this encounter.  ASSESSMENT: No diagnosis found.   PLAN:  No orders of the defined types were placed in this encounter.   Patient Care Team: Medicine, [place] [facility] as PCP - General (Family Medicine)  HISTORY OF PRESENT ILLNESS: 71 y.o. female with a past medical history of hypertension, subarachnoid hemorrhage and TBI with secondary expressive aphasia, amyloid angiopathy, GERD with history of esophageal dilatation and others listed below presents for evaluation of elevated LFTs .  Patient was seen in the hospital 02/02/2021 for abdominal pain epigastric.  Found to have elevated liver function test. AST 81/ALT 107/alk phos 311/ T bili 2.7. Lipase was 453. RUQ [place] Mild intrahepatic and extrahepatic biliary dilation without obstructing stone.  Distended gallbladder with small amount of sludge. MRCP was done to rule out choledocholithiasis.  This showed distended gallbladder without gallstones or gallbladder bladder wall thickening, no pericholecystic edema.,  Bile duct upper limits of normal borderline increase for patient's age 63 mm.  However no choledocholithiasis or obstructing lesion seen in distal common bile duct.  No main duct dilatation the pancreas.  Did have some motion degraded assessment. Thought to be possible passed stone, LFTs down trending.  Recent labs show: AST 48 ALT 63 Alkphos 274 TBili 1.1  Previously saw [facility] 2018 for dysphagia Per their notes 08/2013 had endoscopy and colonoscopy EGD with bx and empiric esophageal dilation to 53 Fr was performed and gastric and eso bx were negative for H pylori or EoE. Colonoscopy was normal, good prep, tortuous colon Recall [phone]    Current Medications:    Current Outpatient Medications  (Cardiovascular):    amLODipine (NORVASC) 10 MG tablet, Take 10 mg by mouth daily.   losartan (COZAAR) 50 MG tablet, Take 1 tablet (50 mg total) by mouth daily. Please hold this medication, please check your blood pressure at home , bring blood pressure record to your pcp, your pcp to decide when to resume this medication   propranolol (INDERAL) 10 MG tablet, Take 10 mg by mouth 2 (two) times daily.   rosuvastatin (CRESTOR) 20 MG tablet, Take 1 tablet (20 mg total) by mouth at bedtime. Please hold this medication until liver function come back to normal, please follow up with your pcp to check your liver function and your pcp to decide when to resume this medication  Current Outpatient Medications (Respiratory):    fluticasone (FLONASE) 50 MCG/ACT nasal spray, Place 2 sprays into both nostrils at bedtime.   SPIRIVA RESPIMAT 1.25 MCG/ACT AERS, Take 2 puffs by mouth daily.  Current Outpatient Medications (Analgesics):    butalbital-acetaminophen-caffeine (FIORICET) 50-325-40 MG tablet, Take 1 tablet by mouth every 6 (six) hours as needed for headache.   Current Outpatient Medications (Other):    ascorbic Acid (VITAMIN C) 500 MG CPCR, Take 1 capsule by mouth daily.   busPIRone (BUSPAR) 5 MG tablet, Take 5 mg by mouth 2 (two) times daily as needed for anxiety.   carbamazepine (TEGRETOL XR) 100 MG 12 hr tablet, Take 100 mg by mouth 2 (two) times daily.   cholecalciferol (VITAMIN D) 1000 units tablet, Take 1,000 Units by mouth daily.   DULoxetine (CYMBALTA) 30 MG capsule, Take 30 mg by mouth daily.   DULoxetine (CYMBALTA) 60 MG capsule, Take 60 mg by mouth daily.    meclizine (ANTIVERT) 25 MG tablet,  Take 25 mg by mouth 2 (two) times daily as needed for dizziness.   memantine (NAMENDA) 10 MG tablet, Take 10 mg by mouth 2 (two) times daily.   Multiple Vitamin (MULTIVITAMIN WITH MINERALS) TABS tablet, Take 1 tablet by mouth daily.   omeprazole (PRILOSEC) 20 MG capsule, Take 20 mg by mouth daily.    ondansetron (ZOFRAN) 4 MG tablet, Take 4 mg by mouth every 8 (eight) hours as needed for nausea/vomiting.   QUEtiapine (SEROQUEL) 100 MG tablet, Take 100 mg by mouth at bedtime.  Medical History:  Past Medical History:  Diagnosis Date   Alzheimer's dementia ([place])    Anxiety    Arthritis    Depression    GERD (gastroesophageal reflux disease)    Hyperlipidemia    Hypertension    Neuromuscular disorder ([place])    Skull fracture ([facility])    Allergies:  Allergies  Allergen Reactions   Lisinopril Swelling    Angioedema   Donepezil     Other reaction(s): Agitation nightmares     Surgical History:  She  has a past surgical history that includes Abdominal hysterectomy and Tubal ligation. Family History:  Her family history includes Cancer in her father; Hepatitis C in her daughter. Social History:   reports that she has quit smoking. She has never used smokeless tobacco. She reports that she does not drink alcohol and does not use drugs.  REVIEW OF SYSTEMS  : All other systems reviewed and negative except where noted in the History of Present Illness.   PHYSICAL EXAM: There were no vitals taken for this visit. General:   Pleasant, well developed female in no acute distress Head:  Normocephalic and atraumatic. Eyes: {sclerae:26738},conjunctive {conjuctiva:26739}  Heart:  {HEART EXAM HEM/ONC:21750} Pulm: Clear anteriorly; no wheezing Abdomen:   {BlankSingle:19197::"Distended","Ridged","Soft"}, {BlankSingle:19197::"Flat","Obese","Non-distended"} AB, {BlankSingle:19197::"Absent","Hyperactive, tinkling","Hypoactive","Sluggish","Active"} bowel sounds. {actendernessAB:27319} tenderness {anatomy; site abdomen:5010}. {BlankMultiple:19196::"Without guarding","With guarding","Without rebound","With rebound"}, No organomegaly appreciated. Extremities:  {With/Without:304960234} edema. Msk:  Symmetrical without gross deformities. Peripheral pulses intact.  Neurologic:  Alert and  oriented x4;  No  focal deficits.  Skin:   Dry and intact without significant lesions or rashes. Psychiatric: Cooperative. Normal mood and affect.    Vladimir Crofts, PA-C 12:19 PM

## 2021-04-23 ENCOUNTER — Ambulatory Visit: Payer: Medicare Other | Admitting: Physician Assistant

## 2021-04-23 DIAGNOSIS — R7989 Other specified abnormal findings of blood chemistry: Secondary | ICD-10-CM

## 2021-10-26 ENCOUNTER — Emergency Department (HOSPITAL_COMMUNITY): Payer: Medicare Other

## 2021-10-26 ENCOUNTER — Inpatient Hospital Stay (HOSPITAL_COMMUNITY)
Admission: EM | Admit: 2021-10-26 | Discharge: 2021-11-03 | DRG: 064 | Disposition: E | Payer: Medicare Other | Attending: Family Medicine | Admitting: Family Medicine

## 2021-10-26 DIAGNOSIS — G309 Alzheimer's disease, unspecified: Secondary | ICD-10-CM | POA: Diagnosis present

## 2021-10-26 DIAGNOSIS — R4182 Altered mental status, unspecified: Secondary | ICD-10-CM | POA: Diagnosis present

## 2021-10-26 DIAGNOSIS — Z66 Do not resuscitate: Secondary | ICD-10-CM | POA: Diagnosis present

## 2021-10-26 DIAGNOSIS — I10 Essential (primary) hypertension: Secondary | ICD-10-CM | POA: Diagnosis present

## 2021-10-26 DIAGNOSIS — G8191 Hemiplegia, unspecified affecting right dominant side: Secondary | ICD-10-CM | POA: Diagnosis present

## 2021-10-26 DIAGNOSIS — Z6841 Body Mass Index (BMI) 40.0 and over, adult: Secondary | ICD-10-CM | POA: Diagnosis not present

## 2021-10-26 DIAGNOSIS — I619 Nontraumatic intracerebral hemorrhage, unspecified: Secondary | ICD-10-CM | POA: Diagnosis not present

## 2021-10-26 DIAGNOSIS — Z809 Family history of malignant neoplasm, unspecified: Secondary | ICD-10-CM

## 2021-10-26 DIAGNOSIS — Z515 Encounter for palliative care: Secondary | ICD-10-CM | POA: Diagnosis not present

## 2021-10-26 DIAGNOSIS — R531 Weakness: Secondary | ICD-10-CM

## 2021-10-26 DIAGNOSIS — Z79899 Other long term (current) drug therapy: Secondary | ICD-10-CM

## 2021-10-26 DIAGNOSIS — I608 Other nontraumatic subarachnoid hemorrhage: Secondary | ICD-10-CM | POA: Diagnosis present

## 2021-10-26 DIAGNOSIS — I161 Hypertensive emergency: Secondary | ICD-10-CM

## 2021-10-26 DIAGNOSIS — F32A Depression, unspecified: Secondary | ICD-10-CM | POA: Diagnosis present

## 2021-10-26 DIAGNOSIS — I609 Nontraumatic subarachnoid hemorrhage, unspecified: Secondary | ICD-10-CM | POA: Diagnosis not present

## 2021-10-26 DIAGNOSIS — G936 Cerebral edema: Secondary | ICD-10-CM

## 2021-10-26 DIAGNOSIS — Z87891 Personal history of nicotine dependence: Secondary | ICD-10-CM

## 2021-10-26 DIAGNOSIS — F0283 Dementia in other diseases classified elsewhere, unspecified severity, with mood disturbance: Secondary | ICD-10-CM | POA: Diagnosis present

## 2021-10-26 DIAGNOSIS — G9341 Metabolic encephalopathy: Secondary | ICD-10-CM | POA: Diagnosis present

## 2021-10-26 DIAGNOSIS — I611 Nontraumatic intracerebral hemorrhage in hemisphere, cortical: Principal | ICD-10-CM | POA: Diagnosis present

## 2021-10-26 DIAGNOSIS — E785 Hyperlipidemia, unspecified: Secondary | ICD-10-CM | POA: Diagnosis present

## 2021-10-26 DIAGNOSIS — G935 Compression of brain: Secondary | ICD-10-CM | POA: Diagnosis present

## 2021-10-26 DIAGNOSIS — I680 Cerebral amyloid angiopathy: Secondary | ICD-10-CM

## 2021-10-26 DIAGNOSIS — R4701 Aphasia: Secondary | ICD-10-CM | POA: Diagnosis present

## 2021-10-26 DIAGNOSIS — R739 Hyperglycemia, unspecified: Secondary | ICD-10-CM | POA: Diagnosis present

## 2021-10-26 DIAGNOSIS — D72828 Other elevated white blood cell count: Secondary | ICD-10-CM | POA: Diagnosis present

## 2021-10-26 DIAGNOSIS — J9601 Acute respiratory failure with hypoxia: Secondary | ICD-10-CM | POA: Diagnosis present

## 2021-10-26 DIAGNOSIS — K219 Gastro-esophageal reflux disease without esophagitis: Secondary | ICD-10-CM | POA: Diagnosis present

## 2021-10-26 DIAGNOSIS — Z789 Other specified health status: Secondary | ICD-10-CM | POA: Diagnosis not present

## 2021-10-26 DIAGNOSIS — Z90710 Acquired absence of both cervix and uterus: Secondary | ICD-10-CM

## 2021-10-26 DIAGNOSIS — F0284 Dementia in other diseases classified elsewhere, unspecified severity, with anxiety: Secondary | ICD-10-CM | POA: Diagnosis present

## 2021-10-26 DIAGNOSIS — R29729 NIHSS score 29: Secondary | ICD-10-CM | POA: Diagnosis present

## 2021-10-26 DIAGNOSIS — Z20822 Contact with and (suspected) exposure to covid-19: Secondary | ICD-10-CM | POA: Diagnosis present

## 2021-10-26 LAB — I-STAT ARTERIAL BLOOD GAS, ED
Acid-base deficit: 2 mmol/L (ref 0.0–2.0)
Bicarbonate: 24.7 mmol/L (ref 20.0–28.0)
Calcium, Ion: 1.17 mmol/L (ref 1.15–1.40)
HCT: 32 % — ABNORMAL LOW (ref 36.0–46.0)
Hemoglobin: 10.9 g/dL — ABNORMAL LOW (ref 12.0–15.0)
O2 Saturation: 100 %
Patient temperature: 98.6
Potassium: 3.6 mmol/L (ref 3.5–5.1)
Sodium: 138 mmol/L (ref 135–145)
TCO2: 26 mmol/L (ref 22–32)
pCO2 arterial: 47.4 mmHg (ref 32–48)
pH, Arterial: 7.324 — ABNORMAL LOW (ref 7.35–7.45)
pO2, Arterial: 304 mmHg — ABNORMAL HIGH (ref 83–108)

## 2021-10-26 LAB — CBC
HCT: 37.7 % (ref 36.0–46.0)
Hemoglobin: 12.4 g/dL (ref 12.0–15.0)
MCH: 31.2 pg (ref 26.0–34.0)
MCHC: 32.9 g/dL (ref 30.0–36.0)
MCV: 95 fL (ref 80.0–100.0)
Platelets: 219 10*3/uL (ref 150–400)
RBC: 3.97 MIL/uL (ref 3.87–5.11)
RDW: 13 % (ref 11.5–15.5)
WBC: 16.5 10*3/uL — ABNORMAL HIGH (ref 4.0–10.5)
nRBC: 0 % (ref 0.0–0.2)

## 2021-10-26 LAB — COMPREHENSIVE METABOLIC PANEL
ALT: 9 U/L (ref 0–44)
AST: 18 U/L (ref 15–41)
Albumin: 3.7 g/dL (ref 3.5–5.0)
Alkaline Phosphatase: 124 U/L (ref 38–126)
Anion gap: 11 (ref 5–15)
BUN: 12 mg/dL (ref 8–23)
CO2: 21 mmol/L — ABNORMAL LOW (ref 22–32)
Calcium: 8.9 mg/dL (ref 8.9–10.3)
Chloride: 109 mmol/L (ref 98–111)
Creatinine, Ser: 0.8 mg/dL (ref 0.44–1.00)
GFR, Estimated: >60 mL/min (ref >60)
Glucose, Bld: 153 mg/dL — ABNORMAL HIGH (ref 70–99)
Potassium: 3.7 mmol/L (ref 3.5–5.1)
Sodium: 141 mmol/L (ref 135–145)
Total Bilirubin: 0.5 mg/dL (ref 0.3–1.2)
Total Protein: 6.2 g/dL — ABNORMAL LOW (ref 6.5–8.1)

## 2021-10-26 LAB — MRSA NEXT GEN BY PCR, NASAL: MRSA by PCR Next Gen: NOT DETECTED

## 2021-10-26 LAB — I-STAT CHEM 8, ED
BUN: 14 mg/dL (ref 8–23)
Calcium, Ion: 1.13 mmol/L — ABNORMAL LOW (ref 1.15–1.40)
Chloride: 107 mmol/L (ref 98–111)
Creatinine, Ser: 0.7 mg/dL (ref 0.44–1.00)
Glucose, Bld: 156 mg/dL — ABNORMAL HIGH (ref 70–99)
HCT: 36 % (ref 36.0–46.0)
Hemoglobin: 12.2 g/dL (ref 12.0–15.0)
Potassium: 3.8 mmol/L (ref 3.5–5.1)
Sodium: 140 mmol/L (ref 135–145)
TCO2: 21 mmol/L — ABNORMAL LOW (ref 22–32)

## 2021-10-26 LAB — ETHANOL: Alcohol, Ethyl (B): <10 mg/dL (ref <10)

## 2021-10-26 LAB — APTT: aPTT: 24 seconds (ref 24–36)

## 2021-10-26 LAB — DIFFERENTIAL
Abs Immature Granulocytes: 0.09 10*3/uL — ABNORMAL HIGH (ref 0.00–0.07)
Basophils Absolute: 0.1 10*3/uL (ref 0.0–0.1)
Basophils Relative: 1 %
Eosinophils Absolute: 0.2 10*3/uL (ref 0.0–0.5)
Eosinophils Relative: 1 %
Immature Granulocytes: 1 %
Lymphocytes Relative: 15 %
Lymphs Abs: 2.5 10*3/uL (ref 0.7–4.0)
Monocytes Absolute: 0.6 10*3/uL (ref 0.1–1.0)
Monocytes Relative: 4 %
Neutro Abs: 13 10*3/uL — ABNORMAL HIGH (ref 1.7–7.7)
Neutrophils Relative %: 78 %

## 2021-10-26 LAB — PROTIME-INR
INR: 1 (ref 0.8–1.2)
Prothrombin Time: 13.5 seconds (ref 11.4–15.2)

## 2021-10-26 LAB — CBG MONITORING, ED: Glucose-Capillary: 147 mg/dL — ABNORMAL HIGH (ref 70–99)

## 2021-10-26 LAB — SARS CORONAVIRUS 2 BY RT PCR: SARS Coronavirus 2 by RT PCR: NEGATIVE

## 2021-10-26 MED ORDER — LEVETIRACETAM IN NACL 500 MG/100ML IV SOLN
500.0000 mg | Freq: Two times a day (BID) | INTRAVENOUS | Status: DC
  Administered 2021-10-26 – 2021-10-30 (×7): 500 mg via INTRAVENOUS
  Filled 2021-10-26 (×9): qty 100

## 2021-10-26 MED ORDER — FENTANYL CITRATE PF 50 MCG/ML IJ SOSY
PREFILLED_SYRINGE | INTRAMUSCULAR | Status: AC
  Filled 2021-10-26: qty 1

## 2021-10-26 MED ORDER — IOHEXOL 350 MG/ML SOLN
75.0000 mL | Freq: Once | INTRAVENOUS | Status: AC | PRN
  Administered 2021-10-26: 75 mL via INTRAVENOUS

## 2021-10-26 MED ORDER — LEVETIRACETAM IN NACL 1500 MG/100ML IV SOLN
1500.0000 mg | Freq: Once | INTRAVENOUS | Status: AC
  Administered 2021-10-26: 1500 mg via INTRAVENOUS
  Filled 2021-10-26: qty 100

## 2021-10-26 MED ORDER — FENTANYL BOLUS VIA INFUSION: 25.0000 ug | INTRAVENOUS | Status: DC | PRN

## 2021-10-26 MED ORDER — CHLORHEXIDINE GLUCONATE CLOTH 2 % EX PADS
6.0000 | MEDICATED_PAD | Freq: Every day | CUTANEOUS | Status: DC
  Administered 2021-10-26: 6 via TOPICAL

## 2021-10-26 MED ORDER — ORAL CARE MOUTH RINSE: 15.0000 mL | OROMUCOSAL | Status: DC | PRN

## 2021-10-26 MED ORDER — FENTANYL 2500MCG IN NS 250ML (10MCG/ML) PREMIX INFUSION
25.0000 ug/h | INTRAVENOUS | Status: DC
  Administered 2021-10-26: 100 ug/h via INTRAVENOUS
  Filled 2021-10-26: qty 250

## 2021-10-26 MED ORDER — SODIUM CHLORIDE 0.9% FLUSH
3.0000 mL | Freq: Once | INTRAVENOUS | Status: AC
  Administered 2021-10-29: 3 mL via INTRAVENOUS

## 2021-10-26 MED ORDER — MIDAZOLAM HCL 5 MG/5ML IJ SOLN
INTRAMUSCULAR | Status: DC | PRN
  Administered 2021-10-26: 2 mg via INTRAVENOUS

## 2021-10-26 MED ORDER — MIDAZOLAM HCL 2 MG/2ML IJ SOLN
INTRAMUSCULAR | Status: AC
  Filled 2021-10-26: qty 2

## 2021-10-26 MED ORDER — FENTANYL CITRATE (PF) 100 MCG/2ML IJ SOLN
INTRAMUSCULAR | Status: DC | PRN
  Administered 2021-10-26: 100 ug via INTRAVENOUS

## 2021-10-26 MED ORDER — ORAL CARE MOUTH RINSE
15.0000 mL | OROMUCOSAL | Status: DC
  Administered 2021-10-26 – 2021-10-27 (×7): 15 mL via OROMUCOSAL

## 2021-10-26 MED ORDER — CLEVIDIPINE BUTYRATE 0.5 MG/ML IV EMUL
0.0000 mg/h | INTRAVENOUS | Status: DC
  Administered 2021-10-26: 4 mg/h via INTRAVENOUS
  Administered 2021-10-26: 8 mg/h via INTRAVENOUS
  Administered 2021-10-26: 10 mg/h via INTRAVENOUS
  Administered 2021-10-27 (×2): 6 mg/h via INTRAVENOUS
  Filled 2021-10-26 (×4): qty 50

## 2021-10-26 MED ORDER — MIDAZOLAM-SODIUM CHLORIDE 100-0.9 MG/100ML-% IV SOLN
0.5000 mg/h | INTRAVENOUS | Status: DC
  Administered 2021-10-26: 4 mg/h via INTRAVENOUS

## 2021-10-26 MED ORDER — DOCUSATE SODIUM 50 MG/5ML PO LIQD: 100.0000 mg | Freq: Two times a day (BID) | ORAL | Status: DC

## 2021-10-26 MED ORDER — MIDAZOLAM-SODIUM CHLORIDE 100-0.9 MG/100ML-% IV SOLN
INTRAVENOUS | Status: AC
  Filled 2021-10-26: qty 100

## 2021-10-26 MED ORDER — SUCCINYLCHOLINE CHLORIDE 20 MG/ML IJ SOLN
INTRAMUSCULAR | Status: DC | PRN
  Administered 2021-10-26: 125 mg via INTRAVENOUS

## 2021-10-26 MED ORDER — ETOMIDATE 2 MG/ML IV SOLN
INTRAVENOUS | Status: DC | PRN
  Administered 2021-10-26: 10 mg via INTRAVENOUS

## 2021-10-26 MED ORDER — LEVETIRACETAM IN NACL 500 MG/100ML IV SOLN
500.0000 mg | Freq: Once | INTRAVENOUS | Status: AC
  Administered 2021-10-26: 500 mg via INTRAVENOUS

## 2021-10-26 NOTE — Code Documentation (Signed)
Time out performed

## 2021-10-26 NOTE — ED Triage Notes (Signed)
Pt bib ems from home with R sided weakness, L sided gaze, unsteady gait, and aphasia. Pt hypertensive with ems. LKW 2100 yesterday.

## 2021-10-26 NOTE — Code Documentation (Signed)
Pt brought back to room from CT for intubation. Pt now vomiting and less responsive.

## 2021-10-26 NOTE — ED Provider Notes (Signed)
[facility] EMERGENCY DEPARTMENT Provider Note   CSN: [id number] Arrival date & time: 10/29/2021  3354  An emergency department physician performed an initial assessment on this suspected stroke patient at [phone].  History  Chief Complaint  Patient presents with   Code Stroke    Shelly Farrell is a 71 y.o. female.  HPI 71 year old female history of subarachnoid hemorrhage, presents today as code stroke with last known normal 2100 yesterday.  She has had right-sided weakness and decreased ability to speak.     Home Medications Prior to Admission medications   Medication Sig Start Date End Date Taking? Authorizing Provider  amLODipine (NORVASC) 10 MG tablet Take 10 mg by mouth daily. 06/11/19   [provider]  ascorbic Acid (VITAMIN C) 500 MG CPCR Take 1 capsule by mouth daily.    [provider]  busPIRone (BUSPAR) 5 MG tablet Take 5 mg by mouth 2 (two) times daily as needed for anxiety. 03/20/19   [provider]  butalbital-acetaminophen-caffeine (FIORICET) 50-325-40 MG tablet Take 1 tablet by mouth every 6 (six) hours as needed for headache. 03/11/19   Donzetta Starch, NP  carbamazepine (TEGRETOL XR) 100 MG 12 hr tablet Take 100 mg by mouth 2 (two) times daily. 01/30/21   [provider]  cholecalciferol (VITAMIN D) 1000 units tablet Take 1,000 Units by mouth daily.    [provider]  DULoxetine (CYMBALTA) 30 MG capsule Take 30 mg by mouth daily. 06/17/19   [provider]  DULoxetine (CYMBALTA) 60 MG capsule Take 60 mg by mouth daily.  02/14/18   [provider]  fluticasone (FLONASE) 50 MCG/ACT nasal spray Place 2 sprays into both nostrils at bedtime. 11/30/20   [provider]  losartan (COZAAR) 50 MG tablet Take 1 tablet (50 mg total) by mouth daily. Please hold this medication, please check your blood pressure at home , bring blood pressure record to your pcp, your pcp to decide when to  resume this medication 02/04/21   Florencia Reasons, MD  meclizine (ANTIVERT) 25 MG tablet Take 25 mg by mouth 2 (two) times daily as needed for dizziness. 11/23/18   [provider]  memantine (NAMENDA) 10 MG tablet Take 10 mg by mouth 2 (two) times daily. 05/21/19   [provider]  Multiple Vitamin (MULTIVITAMIN WITH MINERALS) TABS tablet Take 1 tablet by mouth daily.    [provider]  omeprazole (PRILOSEC) 20 MG capsule Take 20 mg by mouth daily.    [provider]  ondansetron (ZOFRAN) 4 MG tablet Take 4 mg by mouth every 8 (eight) hours as needed for nausea/vomiting. 09/10/19   [provider]  propranolol (INDERAL) 10 MG tablet Take 10 mg by mouth 2 (two) times daily. 02/25/19   [provider]  QUEtiapine (SEROQUEL) 100 MG tablet Take 100 mg by mouth at bedtime. 01/05/21   [provider]  rosuvastatin (CRESTOR) 20 MG tablet Take 1 tablet (20 mg total) by mouth at bedtime. Please hold this medication until liver function come back to normal, please follow up with your pcp to check your liver function and your pcp to decide when to resume this medication 02/04/21   Florencia Reasons, MD  SPIRIVA RESPIMAT 1.25 MCG/ACT AERS Take 2 puffs by mouth daily. 12/22/20   [provider]      Allergies    Lisinopril and Donepezil    Review of Systems   Review of Systems  Physical Exam Updated Vital Signs  BP (!) 140/68   Pulse 79   Resp (!) 21   Ht 1.626 m ('5\' 4"'$ )   SpO2 100%   BMI 22.33 kg/m  Physical Exam  ED Results / Procedures / Treatments   Labs (all labs ordered are listed, but only abnormal results are displayed) Labs Reviewed  CBC - Abnormal; Notable for the following components:      Result Value   WBC 16.5 (*)    All other components within normal limits  DIFFERENTIAL - Abnormal; Notable for the following components:   Neutro Abs 13.0 (*)    Abs Immature Granulocytes 0.09 (*)    All other components within normal limits   COMPREHENSIVE METABOLIC PANEL - Abnormal; Notable for the following components:   CO2 21 (*)    Glucose, Bld 153 (*)    Total Protein 6.2 (*)    All other components within normal limits  I-STAT CHEM 8, ED - Abnormal; Notable for the following components:   Glucose, Bld 156 (*)    Calcium, Ion 1.13 (*)    TCO2 21 (*)    All other components within normal limits  CBG MONITORING, ED - Abnormal; Notable for the following components:   Glucose-Capillary 147 (*)    All other components within normal limits  I-STAT ARTERIAL BLOOD GAS, ED - Abnormal; Notable for the following components:   pH, Arterial 7.324 (*)    pO2, Arterial 304 (*)    HCT 32.0 (*)    Hemoglobin 10.9 (*)    All other components within normal limits  SARS CORONAVIRUS 2 BY RT PCR  PROTIME-INR  APTT  ETHANOL  BLOOD GAS, ARTERIAL    EKG None  Radiology CT ANGIO HEAD CODE STROKE  Result Date: 10/16/2021 CLINICAL DATA:  71 year old female code stroke presentation. Left hemispheric intra-axial and subarachnoid hemorrhage. EXAM: CT ANGIOGRAPHY HEAD TECHNIQUE: Multidetector CT imaging of the head was performed using the standard protocol during bolus administration of intravenous contrast. Multiplanar CT image reconstructions and MIPs were obtained to evaluate the vascular anatomy. RADIATION DOSE REDUCTION: This exam was performed according to the departmental dose-optimization program which includes automated exposure control, adjustment of the mA and/or kV according to patient size and/or use of iterative reconstruction technique. CONTRAST:  48m OMNIPAQUE IOHEXOL 350 MG/ML SOLN COMPARISON:  Plain head CT 0926 hours today. CTA head 03/08/2019 FINDINGS: Posterior circulation: Patent in stable distal vertebral arteries and vertebrobasilar junction. Mild distal vertebral plaque with no significant stenosis. Patent right PICA and dominant appearing left AICA origins. Patent basilar artery. Normal SCA and left PCA origins. Fetal  type right PCA origin redemonstrated. Bilateral PCA branches are stable, mild chronic irregularity on the left. Anterior circulation: Distal cervical ICAs and both ICA siphons are patent and appear stable with mild calcified plaque greater on the right, only mild right siphon stenosis. Normal right posterior communicating artery origin. Patent carotid termini, MCA and ACA origins. Mildly dominant left ACA A1 segment as before. Normal anterior communicating artery. Bilateral ACA branches are stable and within normal limits. Left MCA M1 segment and trifurcation are patent without stenosis. Left MCA branches appear stable except for some evidence of generalized M3 basal spasm on series 14, image 25. Right MCA M1 segment and MCA bifurcation are patent without stenosis. Right MCA branches are stable and within normal limits. Venous sinuses: Insufficient contrast enhancement for evaluation today. Anatomic variants: Fetal type right PCA origin. Review of the MIP images confirms the above findings Other findings: Repeat plain head CT.  Bulky and multifocal intra-axial hemorrhage in the left hemisphere throughout the superior frontal and parietal lobes. Increased hyperdensity of blood along the posterior hemorrhage margin. No definite CTA spot sign. Superimposed subarachnoid hemorrhage, and a small volume of contralateral right superior frontal gyrus contract seal hemorrhage (series 4, image 27). No intraventricular extension or ventriculomegaly. Cerebral edema and intracranial mass effect. Rightward midline shift at the mid cingulate gyrus is stable at roughly 7 mm. Basilar cisterns remain patent. No acute skull fracture identified. There is a chronic nondisplaced left occipital bone fracture unchanged since 2021. IMPRESSION: 1. Severe intracranial hemorrhage with no intracranial aneurysm, vascular malformation, or CTA spot sign. Some Left MCA branch vasospasm suspected, but otherwise stable intracranial CTA since 2021. 2.  Stable intracranial mass effect since 0926 hours today. Basilar cisterns remain patent. No intraventricular extension or ventriculomegaly. Electronically Signed   By: Genevie Ann M.D.   On: 10/25/2021 10:37   DG Chest Portable 1 View  Result Date: 10/25/2021 CLINICAL DATA:  ETT placement EXAM: PORTABLE CHEST 1 VIEW COMPARISON:  Chest x-Addison Whidbee November 27, 2020. FINDINGS: ET tube approximately 3.6 cm above the carina. Gastric tube courses below the diaphragm with the tip in the stomach. Side port is just above the GE junction. Streaky left basilar opacities. No visible pleural effusions or pneumothorax. No acute osseous abnormality. Polyarticular degenerative change. IMPRESSION: 1. ET tube approximately 3.6 cm above the carina. 2. Gastric tube courses below the diaphragm with the tip in the stomach. Side port is just above the GE junction. 3. Streaky left basilar opacities. See ordered/forthcoming CT for further evaluation. Electronically Signed   By: Margaretha Sheffield M.D.   On: 10/08/2021 10:12   CT HEAD CODE STROKE WO CONTRAST  Result Date: 10/19/2021 CLINICAL DATA:  Code stroke. Neuro deficit, acute, stroke suspected. EXAM: CT HEAD WITHOUT CONTRAST TECHNIQUE: Contiguous axial images were obtained from the base of the skull through the vertex without intravenous contrast. RADIATION DOSE REDUCTION: This exam was performed according to the departmental dose-optimization program which includes automated exposure control, adjustment of the mA and/or kV according to patient size and/or use of iterative reconstruction technique. COMPARISON:  Brain MRI 09/13/2019. Head CT 09/13/2019. FINDINGS: Brain: There is extensive acute hemorrhage in the region of the left frontal and parietal lobes, which appears both parenchymal and subarachnoid. An overall volume is difficult to provide given the irregularity of the hemorrhage. However, the largest focus of hemorrhage is located along the medial aspect of the left frontal  lobe, measuring 8.2 x 2.9 cm (AP x TV). 2.4 cm acute parenchymal hemorrhage within the medial right frontal lobe. Trace subarachnoid hemorrhage scattered along the right frontoparietal convexity. Subarachnoid hemorrhage is also present within the interhemispheric fissure. Additionally, there is a possible 13 mm focus of acute parenchymal hemorrhage within the body of the corpus callosum (versus adjacent subarachnoid hemorrhage). Mass effect related to the left-sided hemorrhages with 9 mm rightward midline shift. No intraventricular extension of hemorrhage is identified at this time. Mild patchy and ill-defined hypoattenuation within the cerebral white matter, nonspecific but compatible with chronic small vessel ischemic disease. No acute demarcated cortical infarct is identified. Vascular: No hyperdense vessel.  Atherosclerotic calcifications. Skull: Redemonstrated nondepressed fracture of the left occipital calvarium. Sinuses/Orbits: No mass or acute finding within the imaged orbits. Mild mucosal thickening within the bilateral ethmoid, right sphenoid and left maxillary sinuses. These results were called by telephone at the time of interpretation on 10/03/2021 at 9:37 am to provider Dr. Quinn Axe, who verbally acknowledged these  results. IMPRESSION: Extensive acute hemorrhage in the region of the left frontal and parietal lobes, which appears both parenchymal and subarachnoid. Associated mass effect with 9 mm rightward midline shift. No intraventricular extension of hemorrhage is identified at this time. Smaller acute parenchymal hemorrhages within the medial right frontal lobe, and possibly within the callosal body, as described. Trace subarachnoid hemorrhage scattered along the right frontoparietal convexity. Subarachnoid hemorrhage is also present within the interhemispheric fissure. Mild cerebral white matter chronic small vessel ischemic disease. Electronically Signed   By: Kellie Simmering D.O.   On: 10/04/2021 09:54     Procedures Date/Time: 10/24/2021 9:54 AM  Performed by: Pattricia Boss, MDPre-anesthesia Checklist: Patient identified, Emergency Drugs available, Suction available, Patient being monitored and Timeout performed Oxygen Delivery Method: Non-rebreather mask Preoxygenation: Pre-oxygenation with 100% oxygen Induction Type: Rapid sequence Ventilation: Mask ventilation without difficulty Laryngoscope Size: Glidescope and 4 Tube size: 7.5 mm Number of attempts: 1 Airway Equipment and Method: Patient positioned with wedge pillow and Video-laryngoscopy Placement Confirmation: ETT inserted through vocal cords under direct vision, Positive ETCO2, CO2 detector and Breath sounds checked- equal and bilateral Tube secured with: ETT holder Dental Injury: Teeth and Oropharynx as per pre-operative assessment         Medications Ordered in ED Medications  sodium chloride flush (NS) 0.9 % injection 3 mL (has no administration in time range)  etomidate (AMIDATE) injection (10 mg Intravenous Given 11/02/2021 0942)  succinylcholine (ANECTINE) injection (125 mg Intravenous Given 10/17/2021 0942)  midazolam (VERSED) 2 MG/2ML injection (has no administration in time range)  fentaNYL (SUBLIMAZE) 50 MCG/ML injection (has no administration in time range)  midazolam (VERSED) 5 MG/5ML injection (2 mg Intravenous Given 10/09/2021 0946)  fentaNYL (SUBLIMAZE) injection (100 mcg Intravenous Given 10/05/2021 0946)  midazolam (VERSED) 100 mg/100 mL (1 mg/mL) premix infusion (4 mg/hr Intravenous New Bag/Given 11/02/2021 1001)  midazolam-sodium chloride 100-0.9 MG/100ML-% infusion (has no administration in time range)  levETIRAcetam (KEPPRA) IVPB 500 mg/100 mL premix (has no administration in time range)  clevidipine (CLEVIPREX) infusion 0.5 mg/mL (has no administration in time range)  docusate (COLACE) 50 MG/5ML liquid 100 mg (has no administration in time range)  fentaNYL 258mg in NS 2579m(1011mml) infusion-PREMIX (100  mcg/hr Intravenous New Bag/Given 10/06/2021 1131)  fentaNYL (SUBLIMAZE) bolus via infusion 25-100 mcg (has no administration in time range)  levETIRAcetam (KEPPRA) IVPB 1500 mg/ 100 mL premix (1,500 mg Intravenous New Bag/Given 10/13/2021 1011)    And  levETIRAcetam (KEPPRA) IVPB 500 mg/100 mL premix (500 mg Intravenous New Bag/Given 10/18/2021 1005)  iohexol (OMNIPAQUE) 350 MG/ML injection 75 mL (75 mLs Intravenous Contrast Given 10/07/2021 1020)    ED Course/ Medical Decision Making/ A&P Clinical Course as of 10/29/2021 1137  Tue Oct 26, 2021  1008 Chest x-Zekiah Coen reviewed and interpreted with good ET tube placement appears to be about 3.5 cm above carina [DR]  1136 CBC reviewed interpreted significant for leukocytosis 16,500 with normal hemoglobin and platelets [DR]  1136 Comprehensive metabolic panel(!) Point metabolic panel reviewed and interpreted significant for hyperglycemia at 153 [DR]    Clinical Course User Index [DR] RayPattricia BossD                           Medical Decision Making Patient seen at bridge on arrival and is coughing and appears to be controlling airway Patient went to CT scan Large subarachnoid identified and patient appears to have decreasing mental status and return to ED for intubation  Please see intubation note Postintubation sedation with fentanyl and Versed CT reviewed Discussed with Dr. Quinn Axe. She requests call be placed to neurosurgery Consults neurosurgery placed Dr. Quinn Axe d/w neurosurgery and family.  Neurosurgery states no interventions will improve outcome family wants to continue care at this time.  Son is coming from town.  They feel they likely will withdraw care after he has been able to see her. Plan is to continue care with Cleviprex and dilatory support at this time She is continued on sedation. Discussed with Richardson Landry Minor of for critical care and they will see I, also, discussed with husband, Leroy Sea and filled out DNR in epic  Amount and/or  Complexity of Data Reviewed External Data Reviewed: notes.    Details: Reviewed prior admission with subarachnoid hemorrhage Labs: ordered. Decision-making details documented in ED Course. Radiology: ordered and independent interpretation performed. Decision-making details documented in ED Course. ECG/medicine tests: ordered and independent interpretation performed. Decision-making details documented in ED Course.    Details: Patient maintained on monitor and is hypertensive Cleviprex drip is in place per neurology Discussion of management or test interpretation with external provider(s): Dr. Horace Porteous for neurology Neurosurgery paged  Risk Prescription drug management. Parenteral controlled substances. Decision regarding hospitalization. Decision not to resuscitate or to de-escalate care because of poor prognosis.  Critical Care Total time providing critical care: 60 minutes            Final Clinical Impression(s) / ED Diagnoses Final diagnoses:  Left-sided nontraumatic intracerebral hemorrhage, unspecified cerebral location [facility])    Rx / DC Orders ED Discharge Orders     None         Pattricia Boss, MD 10/11/2021 1137

## 2021-10-26 NOTE — Progress Notes (Signed)
[facility] Attending:    Subjective: 71 y/o female with a past history of [place] in 2021 presented today after being found confused this morning.  Noted to have a headache, CT head showed massive intrascerebral hemorrhage in left frontal area with mass effect.  The patient required intubation for airway protection and could not provide further history.  Neurosurgery was consulted and stated that no intervention would be life saving at this point.  She has been treated with sedation and cleviprex.  PCCM asked to admit.  Past Medical History:  Diagnosis Date   Alzheimer's dementia ([place])    Anxiety    Arthritis    Depression    GERD (gastroesophageal reflux disease)    Hyperlipidemia    Hypertension    Neuromuscular disorder ([place])    Skull fracture ([facility])      Objective: Vitals:   10/07/2021 1100 10/09/2021 1115 10/21/2021 1215 10/12/2021 1315  BP: (!) 118/90 (!) 140/68 137/62 (!) 144/56  Pulse: 77 79 73 77  Resp: (!) 21 (!) '21 18 18  '$ SpO2: 100% 100% 100% 100%  Height:       Vent Mode: PRVC FiO2 (%):  [40 %-100 %] 40 % Set Rate:  [20 bmp] 20 bmp Vt Set:  [440 mL] 440 mL PEEP:  [5 cmH20] 5 cmH20 Plateau Pressure:  [16 cmH20] 16 cmH20 No intake or output data in the 24 hours ending 10/24/2021 1322  General:  In bed on vent HENT: NCAT ETT in place PULM: CTA B, vent supported breathing CV: RRR, no mgr GI: BS+, soft, nontender MSK: normal bulk and tone Neuro: sedated on vent    CBC    Component Value Date/Time   WBC 16.5 (H) 10/29/2021 0930   RBC 3.97 10/28/2021 0930   HGB 10.9 (L) 10/16/2021 1047   HCT 32.0 (L) 10/15/2021 1047   PLT 219 10/15/2021 0930   MCV 95.0 10/23/2021 0930   MCH 31.2 10/09/2021 0930   MCHC 32.9 10/28/2021 0930   RDW 13.0 11/01/2021 0930   LYMPHSABS 2.5 10/07/2021 0930   MONOABS 0.6 10/19/2021 0930   EOSABS 0.2 10/25/2021 0930   BASOSABS 0.1 10/21/2021 0930    BMET    Component Value Date/Time   NA 138 10/06/2021 1047   K 3.6 10/25/2021 1047   CL  109 10/14/2021 0930   CL 107 10/15/2021 0930   CO2 21 (L) 11/02/2021 0930   GLUCOSE 153 (H) 10/10/2021 0930   GLUCOSE 156 (H) 10/04/2021 0930   BUN 12 10/29/2021 0930   BUN 14 10/27/2021 0930   CREATININE 0.80 10/28/2021 0930   CREATININE 0.70 10/08/2021 0930   CALCIUM 8.9 10/17/2021 0930   GFRNONAA >60 10/17/2021 0930   GFRAA >60 09/13/2019 0912    CXR images personally reviewed, ett in place, lungs clear, normal cardiac silhouette  Impression: Massive subarachnoid hemorrhage Acute encephalopathy due to subarachnoid hemorrhage Acute respiratory failure with hypoxemia due to inability to protect airway Hypertension DNR Will not survive this illness  Plan: Admit to ICU Plan for withdrawal of care Comfort measures: no further labs, no escalation of current therapy Continue full vent support, sedation, cleviprex until family has arrived Continue to discuss logistics with family, expect withdrawal of care in next 24 hours Notify CDS as per policy  I updated her daughter bedside.  My cc time 32 minutes  Roselie Awkward, MD [facility] Pager: [phone] Cell: [phone] After 7pm: [phone]

## 2021-10-26 NOTE — ED Notes (Signed)
$'10mg'Y$  Labetalol given per verbal order from Dr. Quinn Axe.

## 2021-10-26 NOTE — Progress Notes (Signed)
Pt transported from ED Room 32 to [id number] with no complications noted. Report given to unit RT.

## 2021-10-26 NOTE — Code Documentation (Signed)
Shelly Farrell is a 71 yr old female with a past medical history of amyloid angiopathy, dementia and SAH. She is on no thinners.  Pt presents to [facility] 10/13/2021 for evaluation of rt sided weakness and aphasia.     Pt was last known well last night at 2100. She was found altered this morning by her husband who called EMS. Code Stroke activated by EMS for aphasia and rt hemiplegia.     Pt is met at the bridge by stroke team. Pt's airway cleared by EDP, blood drawn and CBG obtained. Pt NIHSS 27 (please see documentation for details and times). Pt with rt hemiplegia, aphasia, and left sided gaze.     Pt to CT with team. Per Dr Quinn Axe, CT shows Left frontal ICH and SAH. Pt hypertensive. Labetelol given and cleviprex started in CT scanner in preparation for CTA. Pt exam deteriorating. Pt taken back to Room 32 and intubated by EDP. After airway verified by CXR, pt returned to CT for CTA. Per Dr Quinn Axe, CTA is negative for macrovascular source of bleed.    Pt will need continued tight BP control 130-150. She will need q 1 hr NIHSS and pupil exam. Bedside handoff with Patent examiner.

## 2021-10-26 NOTE — ED Notes (Signed)
$'4mg'v$  zofran given per Dr. Quinn Axe. Cleviprex initiated at '2mg'$  per verbal from Dr. Quinn Axe.

## 2021-10-26 NOTE — Consult Note (Signed)
NEUROLOGY CONSULTATION NOTE   Date of service: October 26, 2021 Patient Name: Shelly Farrell MRN:  [id number] DOB:  [birth year] Reason for consult: stroke code Requesting physician: Dr. Pattricia Boss _ _ _   _ __   _ __ _ _  __ __   _ __   __ _  History of Present Illness   71 yo woman with pmhx Alzheimer's disease, hypertension, hyperlipidemia, subarachnoid hemorrhage in 2021 with no evidence of aneurysm found thought to be secondary to possible cerebral amyloid angiopathy who is brought in by EMS to ED for evaluation of right-sided weakness and aphasia.  Last known well was 28 100 last night.  She was found altered this morning by her husband who called EMS.  Stroke code was activated in route.  She is not on anticoagulation.  Systolic blood pressure was in the 190s on arrival.  Stroke scale was 29.  Exam showed right hemiplegia, aphasia, and left-sided gaze.  CT head showed large left frontal ICH as well as extensive subarachnoid hemorrhage.  She became increasingly hypertensive and labetalol was given and clevidipine started.  Patient became unable to protect her airway and was intubated.  Afterwards CTA was performed which showed no aneurysm or vascular malformation, right MCA branch vasospasm, otherwise stable compared to CTA in 2021 (personal review).  I spoke with Dr. Venetia Constable of neurosurgery and we reviewed the images together over the phone.  We are in agreement that there are no interventions that could be offered that would benefit the patient and that overall this bleed is not survivable.  I had an extensive conversation with patient's husband Shelly Farrell regarding this.  He does "not want her to suffer."  He would like [place] to continue current care, no escalation, until her other son is able to come to the hospital and say goodbye.  At that point they would consider withdrawal of care.  He requested that she be made DNR because he would not want her to receive any further heroic  interventions that would be futile.  All CNS imaging personally reviewed   ROS   UTA 2/2 mental status  Past History   I have reviewed the following:  Past Medical History:  Diagnosis Date   Alzheimer's dementia ([place])    Anxiety    Arthritis    Depression    GERD (gastroesophageal reflux disease)    Hyperlipidemia    Hypertension    Neuromuscular disorder ([place])    Skull fracture ([facility])    Past Surgical History:  Procedure Laterality Date   ABDOMINAL HYSTERECTOMY     TUBAL LIGATION     Family History  Problem Relation Age of Onset   Cancer Father    Hepatitis C Daughter    Social History   Socioeconomic History   Marital status: Married    Spouse name: Not on file   Number of children: Not on file   Years of education: Not on file   Highest education level: Not on file  Occupational History   Not on file  Tobacco Use   Smoking status: Former   Smokeless tobacco: Never  Vaping Use   Vaping Use: Never used  Substance and Sexual Activity   Alcohol use: No    Alcohol/week: 0.0 standard drinks of alcohol   Drug use: No   Sexual activity: Not on file  Other Topics Concern   Not on file  Social History Narrative   Not on file   Social Determinants of  Health   Financial Resource Strain: Not on file  Food Insecurity: Not on file  Transportation Needs: Not on file  Physical Activity: Not on file  Stress: Not on file  Social Connections: Unknown (03/03/2017)   Social Connection and Isolation Panel [NHANES]    Frequency of Communication with Friends and Family: Not on file    Frequency of Social Gatherings with Friends and Family: Not on file    Attends Religious Services: Not on file    Active Member of Clubs or Organizations: Not on file    Attends Archivist Meetings: Not on file    Marital Status: Married   Allergies  Allergen Reactions   Lisinopril Swelling    Angioedema   Donepezil     Other reaction(s): Agitation nightmares     Medications   (Not in a hospital admission)     Current Facility-Administered Medications:    clevidipine (CLEVIPREX) infusion 0.5 mg/mL, 0-21 mg/hr, Intravenous, Continuous, Quinn Axe, Patrecia Pour, MD   etomidate (AMIDATE) injection, , Intravenous, PRN, Pattricia Boss, MD, 10 mg at 10/27/2021 0942   fentaNYL (SUBLIMAZE) 50 MCG/ML injection, , , ,    fentaNYL (SUBLIMAZE) injection, , Intravenous, PRN, Pattricia Boss, MD, 100 mcg at 10/19/2021 0946   levETIRAcetam (KEPPRA) IVPB 500 mg/100 mL premix, 500 mg, Intravenous, Q12H, Derek Jack, MD   midazolam (VERSED) 100 mg/100 mL (1 mg/mL) premix infusion, 0.5-10 mg/hr, Intravenous, Continuous, Derek Jack, MD, Last Rate: 4 mL/hr at 10/09/2021 1001, 4 mg/hr at 10/08/2021 1001   midazolam (VERSED) 2 MG/2ML injection, , , ,    midazolam (VERSED) 5 MG/5ML injection, , Intravenous, PRN, Pattricia Boss, MD, 2 mg at 10/25/2021 0946   midazolam-sodium chloride 100-0.9 MG/100ML-% infusion, , , ,    sodium chloride flush (NS) 0.9 % injection 3 mL, 3 mL, Intravenous, Once, Pattricia Boss, MD   succinylcholine (ANECTINE) injection, , Intravenous, PRN, Pattricia Boss, MD, 125 mg at 10/20/2021 0942  Current Outpatient Medications:    amLODipine (NORVASC) 10 MG tablet, Take 10 mg by mouth daily., Disp: , Rfl:    ascorbic Acid (VITAMIN C) 500 MG CPCR, Take 1 capsule by mouth daily., Disp: , Rfl:    busPIRone (BUSPAR) 5 MG tablet, Take 5 mg by mouth 2 (two) times daily as needed for anxiety., Disp: , Rfl:    butalbital-acetaminophen-caffeine (FIORICET) 50-325-40 MG tablet, Take 1 tablet by mouth every 6 (six) hours as needed for headache., Disp: 14 tablet, Rfl: 0   carbamazepine (TEGRETOL XR) 100 MG 12 hr tablet, Take 100 mg by mouth 2 (two) times daily., Disp: , Rfl:    cholecalciferol (VITAMIN D) 1000 units tablet, Take 1,000 Units by mouth daily., Disp: , Rfl:    DULoxetine (CYMBALTA) 30 MG capsule, Take 30 mg by mouth daily., Disp: , Rfl:    DULoxetine (CYMBALTA)  60 MG capsule, Take 60 mg by mouth daily. , Disp: , Rfl:    fluticasone (FLONASE) 50 MCG/ACT nasal spray, Place 2 sprays into both nostrils at bedtime., Disp: , Rfl:    losartan (COZAAR) 50 MG tablet, Take 1 tablet (50 mg total) by mouth daily. Please hold this medication, please check your blood pressure at home , bring blood pressure record to your pcp, your pcp to decide when to resume this medication, Disp: , Rfl:    meclizine (ANTIVERT) 25 MG tablet, Take 25 mg by mouth 2 (two) times daily as needed for dizziness., Disp: , Rfl:    memantine (NAMENDA) 10 MG  tablet, Take 10 mg by mouth 2 (two) times daily., Disp: , Rfl:    Multiple Vitamin (MULTIVITAMIN WITH MINERALS) TABS tablet, Take 1 tablet by mouth daily., Disp: , Rfl:    omeprazole (PRILOSEC) 20 MG capsule, Take 20 mg by mouth daily., Disp: , Rfl:    ondansetron (ZOFRAN) 4 MG tablet, Take 4 mg by mouth every 8 (eight) hours as needed for nausea/vomiting., Disp: , Rfl:    propranolol (INDERAL) 10 MG tablet, Take 10 mg by mouth 2 (two) times daily., Disp: , Rfl:    QUEtiapine (SEROQUEL) 100 MG tablet, Take 100 mg by mouth at bedtime., Disp: , Rfl:    rosuvastatin (CRESTOR) 20 MG tablet, Take 1 tablet (20 mg total) by mouth at bedtime. Please hold this medication until liver function come back to normal, please follow up with your pcp to check your liver function and your pcp to decide when to resume this medication, Disp: , Rfl:    SPIRIVA RESPIMAT 1.25 MCG/ACT AERS, Take 2 puffs by mouth daily., Disp: , Rfl:   Vitals   Vitals:   10/28/2021 1023 10/23/2021 1025 10/04/2021 1028 10/20/2021 1030  BP:  134/61 139/68 (!) 141/69  Pulse: 75 73 72 72  Resp: 20 (!) '21 18 19  '$ SpO2: 100% 100% 100%   Height: '5\' 4"'$  (1.626 m)        Body mass index is 22.33 kg/m.  Physical Exam   Physical Exam Gen: A&O x4, NAD HEENT: Atraumatic, normocephalic;mucous membranes moist; oropharynx clear, tongue without atrophy or fasciculations. Neck: Supple, trachea  midline. Resp: CTAB, no w/r/r CV: RRR, no m/g/r; nml S1 and S2. 2+ symmetric peripheral pulses. Abd: soft/NT/ND; nabs x 4 quad Extrem: Nml bulk; no cyanosis, clubbing, or edema.  Neuro: *MS: obtunded, minimally arousable to pain, does not follow commands *Speech: mute / global aphasia, severe dysarthria *CN: PERRL, L gaze deviation does not cross midline with oculocephalics; (+) corneals, R facial droop, no response to voice *Motor & sensory: no movement to noxious stimuli RUE and RLE, withdrawal but not anti-gravity to noxious stimuli LUE and LLE *Coordination:  UTA *Reflexes:  2+ bilat more brisk on R, no clonus; toes mute bilat *Gait: UTA  NIHSS  1a Level of Conscious.: 2 1b LOC Questions: 2 1c LOC Commands: 2 2 Best Gaze: 2 3 Visual: 0 4 Facial Palsy: 1 5a Motor Arm - left: 3 5b Motor Arm - Right: 4 6a Motor Leg - Left: 3 6b Motor Leg - Right: 4 7 Limb Ataxia: 0 8 Sensory: 1 9 Best Language: 3 10 Dysarthria: 2 11 Extinct. and Inatten.: 0  TOTAL: 29   Premorbid mRS = 2   Labs   CBC:  Recent Labs  Lab 11/01/2021 0930  WBC 16.5*  NEUTROABS 13.0*  HGB 12.4  12.2  HCT 37.7  36.0  MCV 95.0  PLT 009    Basic Metabolic Panel:  Lab Results  Component Value Date   NA 141 11/02/2021   NA 140 10/22/2021   K 3.7 10/18/2021   K 3.8 10/21/2021   CO2 21 (L) 10/22/2021   GLUCOSE 153 (H) 10/09/2021   GLUCOSE 156 (H) 10/17/2021   BUN 12 10/19/2021   BUN 14 10/14/2021   CREATININE 0.80 10/20/2021   CREATININE 0.70 10/05/2021   CALCIUM 8.9 10/23/2021   GFRNONAA >60 10/22/2021   GFRAA >60 09/13/2019   Lipid Panel:  Lab Results  Component Value Date   LDLCALC 158 (H) 03/11/2019   HgbA1c:  Lab  Results  Component Value Date   HGBA1C 5.5 03/11/2019   Urine Drug Screen:     Component Value Date/Time   LABOPIA POSITIVE (A) 09/13/2019 1142   COCAINSCRNUR NONE DETECTED 09/13/2019 1142   LABBENZ NONE DETECTED 09/13/2019 1142   AMPHETMU NONE DETECTED  09/13/2019 1142   THCU POSITIVE (A) 09/13/2019 1142   LABBARB POSITIVE (A) 09/13/2019 1142    Alcohol Level     Component Value Date/Time   ETH <10 10/12/2021 0930     Impression   71 yo woman with pmhx Alzheimer's disease, hypertension, hyperlipidemia, subarachnoid hemorrhage in 2021 with no evidence of aneurysm found thought to be secondary to possible cerebral amyloid angiopathy who is brought in by EMS to ED for evaluation of right-sided weakness and aphasia and was found to have a large L frontal IPH with 16m midline shift and extensive SAH. CTA showed no aneurysm. NIHSS = 29. I spoke with Dr. OVenetia Constableof neurosurgery and we reviewed the images together over the phone.  We are in agreement that there are no interventions that could be offered that would benefit the patient and that overall this bleed is not survivable.  I had an extensive conversation with patient's husband BChea Malanregarding this.  He does "not want her to suffer."  He would like [place] continue [place] care, no escalation, until her other son is able to come to the hospital and say goodbye.  At that point they would consider withdrawal of care.  He requested that she be made DNR because he would not want her to receive any further heroic interventions that would be futile.  Recommendations   - Admit to ICU under CCM - Continue current care - ventilated, BP control, sedation for comfort until family is able to say goodbye - They plan to withdraw care after that likely within the next few days. Patient is NOT yet full comfort care - Patient is now DNR - no cardiac resuscitation - NSU said bleed is not survivable, no intervention possible to benefit - HOB elev 30 degrees - SCDs for DVT prophy - No indication for repeat scans 2/2 futility - Nicardipine for goal SBP <150 - Sedation with versed and fentanyl for comfort - Keppra loaded for family comfort in setting of jerking and for seizure prophylaxis - No  indication for nimodipine  Neurology will not continue to actively follow but please reengage if we can be of any other further assistance.  This patient is critically ill and at significant risk of neurological worsening, death and care requires constant monitoring of vital signs, hemodynamics,respiratory and cardiac monitoring, neurological assessment, discussion with family, other specialists and medical decision making of high complexity. I spent 120 minutes of neurocritical care time  in the care of  this patient. This was time spent independent of any time provided by nurse practitioner or PA.   CSu Monks MD [facility] [phone]  If 7pm- 7am, please page neurology on call as listed in [place]   **Any copied and pasted documentation in this note was written by me in another application not billed for and pasted by me into this document.

## 2021-10-26 NOTE — Progress Notes (Signed)
Pt transported from ED Room 32 to CT and back with no complications noted.

## 2021-10-26 NOTE — Consult Note (Addendum)
HISTORY & PHYSICAL (NOT CONSULT)     NAME:  Shelly Farrell, MRN:  [id number], DOB:  1950-07-27, LOS: 0 ADMISSION DATE:  10/06/2021, CONSULTATION DATE:  10/03/2021 REFERRING MD:  Dr. Jeanell Sparrow- ED, CHIEF COMPLAINT:  ICH   History of Present Illness:  71 year old female who was brought to the ED via EMS for right-sided weakness, aphasia, and altered mental status.  Per documentation, her last known normal was 2100 10/23 pm.  However, husband at bedside states she woke up in her normal state of health.  Despite timing, there was a change in her mental status and strength that prompted the husband to call EMS.  Code stroke was activated prior to arrival.    In the ED, her systolic BP was in the 297'L.  Her CT head showed a large left frontal ICH along with an extensive subarachnoid hemorrhage.  She was given Cleviprex and Labetalol for increasing hypertension.  She was subsequently intubated due to inability to protect her airway.  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  Neurology evaluated the patient and determined there was no intervention possible and the bleed was not survivable.  NIHSS 29.   The family opted to make the patient a DNR, as they want her to be comfortable. The family would like to take care of some social concerns prior to extubation.  PCCM was consulted for admission to the ICU given she is currently on the ventilator.    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 rate 18, height '5\' 4"'$  (1.626 m),  SpO2 100 %.    Vent Mode: PRVC FiO2 (%):  [40 %-100 %] 40 % Set Rate:  [20 bmp] 20 bmp Vt Set:  [440 mL] 440 mL PEEP:  [5 cmH20] 5 cmH20 Plateau Pressure:  [16 cmH20] 16 cmH20  No intake or output data in the 24 hours ending 10/09/2021 1239 There were no vitals filed for this visit.  Examination: General: elderly female lying in bed on vent in NAD HEENT: MM pink/moist, ETT, anicteric, pupils 34m Neuro: sedate, withdrawals to pain on left, posturing on right, +gag/cough CV: s1s2 RRR, SR on monitor, no m/r/g PULM: non-labored at rest, lungs bilaterally clear  GI: soft, bsx4 active  Extremities: warm/dry, no edema  Skin: lacy petechial rash on hands  Resolved Hospital Problem list     Assessment & Plan:   Intraparenchymal Hemorrhage / Subarachnoid Hemorrhage Acute Metabolic Encephalopathy in setting of ICH  Acute Respiratory Failure secondary to ICushing Hyperglycemia  Hx Alzheimer's Dementia, Anxiety, Depression Comfort Measures / Palliative Care  CT head revealed large L frontal IPH with 972mmidline shift and extensive SAH  -Admit to ICU -DNR, family aware she may transition despite full support  -PRVC support until family ready to transition to compassionate extubation  -Titrate FiO2 for goal sat >90% -promote all measures that focus on patient comfort / symptom relief  -No labs or imaging -Fentanyl / versed IV for pain/sedation  -Continue IV Cleviprex/Keppra per Neurology -NPO -Hold home medications  Best Practice (right click and "Reselect all  SmartList Selections" daily)   Diet/type: NPO DVT prophylaxis: not indicated GI prophylaxis: N/A Lines: N/A Foley:  N/A Code Status:  DNR Last date of multidisciplinary goals of care discussion [10/24]  Labs   CBC: Recent Labs  Lab 10/04/2021 0930 10/16/2021 1047  WBC 16.5*  --   NEUTROABS 13.0*  --   HGB 12.4  12.2 10.9*  HCT 37.7  36.0 32.0*  MCV 95.0  --   PLT 219  --     Basic Metabolic Panel: Recent Labs  Lab  10/25/2021 0930 10/25/2021 1047  NA 141  140 138  K 3.7  3.8 3.6  CL 109  107  --   CO2 21*  --   GLUCOSE 153*  156*  --   BUN 12  14  --   CREATININE 0.80  0.70  --   CALCIUM 8.9  --    GFR: CrCl cannot be calculated (Unknown ideal weight.). Recent Labs  Lab 10/04/2021 0930  WBC 16.5*    Liver Function Tests: Recent Labs  Lab 10/11/2021 0930  AST 18  ALT 9  ALKPHOS 124  BILITOT 0.5  PROT 6.2*  ALBUMIN 3.7   No results for input(s): "LIPASE", "AMYLASE" in the last 168 hours. No results for input(s): "AMMONIA" in the last 168 hours.  ABG    Component Value Date/Time   PHART 7.324 (L) 10/13/2021 1047   PCO2ART 47.4 10/06/2021 1047   PO2ART 304 (H) 10/05/2021 1047   HCO3 24.7 10/17/2021 1047   TCO2 26 10/29/2021 1047   ACIDBASEDEF 2.0 10/03/2021 1047   O2SAT 100 10/03/2021 1047     Coagulation Profile: Recent Labs  Lab 10/24/2021 0930  INR 1.0    Cardiac Enzymes: No results for input(s): "CKTOTAL", "CKMB", "CKMBINDEX", "TROPONINI" in the last 168 hours.  HbA1C: Hgb A1c MFr Bld  Date/Time Value Ref Range Status  03/11/2019 04:14 AM 5.5 4.8 - 5.6 % Final    Comment:    (NOTE) Pre diabetes:          5.7%-6.4% Diabetes:              >6.4% Glycemic control for   <7.0% adults with diabetes     CBG: Recent Labs  Lab 10/03/2021 0920  GLUCAP 147*    Review of Systems:   Unable to obtain ROS as patient is intubated and sedated. Large ICH. Information obtained from staff at bedside, chart review and family.   Past Medical History:  She,  has a past medical history of Alzheimer's dementia ([place]), Anxiety, Arthritis, Depression, GERD (gastroesophageal reflux disease), Hyperlipidemia, Hypertension, Neuromuscular disorder ([place]), and Skull fracture ([place]).   Surgical History:   Past Surgical History:  Procedure Laterality Date   ABDOMINAL HYSTERECTOMY     TUBAL LIGATION       Social History:   reports that she has quit smoking. She has never used  smokeless tobacco. She reports that she does not drink alcohol and does not use drugs.   Family History:  Her family history includes Cancer in her father; Hepatitis C in her daughter.   Allergies Allergies  Allergen Reactions   Lisinopril Swelling    Angioedema   Donepezil     Other reaction(s): Agitation nightmares     Home Medications  Prior to Admission medications   Medication Sig Start Date End Date Taking? Authorizing Provider  amLODipine (NORVASC) 10 MG tablet Take 10 mg by mouth daily. 06/11/19   [provider]  ascorbic Acid (VITAMIN C)  500 MG CPCR Take 1 capsule by mouth daily.    [provider]  busPIRone (BUSPAR) 5 MG tablet Take 5 mg by mouth 2 (two) times daily as needed for anxiety. 03/20/19   [provider]  butalbital-acetaminophen-caffeine (FIORICET) 50-325-40 MG tablet Take 1 tablet by mouth every 6 (six) hours as needed for headache. 03/11/19   Donzetta Starch, NP  carbamazepine (TEGRETOL XR) 100 MG 12 hr tablet Take 100 mg by mouth 2 (two) times daily. 01/30/21   [provider]  cholecalciferol (VITAMIN D) 1000 units tablet Take 1,000 Units by mouth daily.    [provider]  DULoxetine (CYMBALTA) 30 MG capsule Take 30 mg by mouth daily. 06/17/19   [provider]  DULoxetine (CYMBALTA) 60 MG capsule Take 60 mg by mouth daily.  02/14/18   [provider]  fluticasone (FLONASE) 50 MCG/ACT nasal spray Place 2 sprays into both nostrils at bedtime. 11/30/20   [provider]  losartan (COZAAR) 50 MG tablet Take 1 tablet (50 mg total) by mouth daily. Please hold this medication, please check your blood pressure at home , bring blood pressure record to your pcp, your pcp to decide when to resume this medication 02/04/21   Florencia Reasons, MD  meclizine (ANTIVERT) 25 MG tablet Take 25 mg by mouth 2 (two) times daily as needed for dizziness. 11/23/18   [provider]  memantine (NAMENDA) 10 MG tablet Take  10 mg by mouth 2 (two) times daily. 05/21/19   [provider]  Multiple Vitamin (MULTIVITAMIN WITH MINERALS) TABS tablet Take 1 tablet by mouth daily.    [provider]  omeprazole (PRILOSEC) 20 MG capsule Take 20 mg by mouth daily.    [provider]  ondansetron (ZOFRAN) 4 MG tablet Take 4 mg by mouth every 8 (eight) hours as needed for nausea/vomiting. 09/10/19   [provider]  propranolol (INDERAL) 10 MG tablet Take 10 mg by mouth 2 (two) times daily. 02/25/19   [provider]  QUEtiapine (SEROQUEL) 100 MG tablet Take 100 mg by mouth at bedtime. 01/05/21   [provider]  rosuvastatin (CRESTOR) 20 MG tablet Take 1 tablet (20 mg total) by mouth at bedtime. Please hold this medication until liver function come back to normal, please follow up with your pcp to check your liver function and your pcp to decide when to resume this medication 02/04/21   Florencia Reasons, MD  SPIRIVA RESPIMAT 1.25 MCG/ACT AERS Take 2 puffs by mouth daily. 12/22/20   [provider]     Critical care time: 49 minutes      Noe Gens, MSN, APRN, NP-C, AGACNP-BC [facility] 10/27/2021, 2:46 PM   Please see [email or web address] for pager details.   From 7A-7P if no response, please call [phone] After hours, please call ELink [phone]

## 2021-10-26 NOTE — ED Notes (Signed)
Urinary catheter placed

## 2021-10-27 DIAGNOSIS — Z66 Do not resuscitate: Secondary | ICD-10-CM

## 2021-10-27 DIAGNOSIS — J9601 Acute respiratory failure with hypoxia: Secondary | ICD-10-CM | POA: Diagnosis not present

## 2021-10-27 DIAGNOSIS — G935 Compression of brain: Secondary | ICD-10-CM | POA: Diagnosis not present

## 2021-10-27 MED ORDER — SODIUM CHLORIDE 0.9 % IV SOLN: INTRAVENOUS | Status: DC

## 2021-10-27 MED ORDER — MIDAZOLAM BOLUS VIA INFUSION (WITHDRAWAL LIFE SUSTAINING TX): 2.0000 mg | INTRAVENOUS | Status: DC | PRN

## 2021-10-27 MED ORDER — FENTANYL BOLUS VIA INFUSION
100.0000 ug | INTRAVENOUS | Status: DC | PRN
  Administered 2021-10-30: 100 ug via INTRAVENOUS

## 2021-10-27 MED ORDER — GLYCOPYRROLATE 0.2 MG/ML IJ SOLN
0.2000 mg | INTRAMUSCULAR | Status: DC | PRN
  Administered 2021-10-27 – 2021-10-30 (×8): 0.2 mg via INTRAVENOUS
  Filled 2021-10-27 (×8): qty 1

## 2021-10-27 MED ORDER — GLYCOPYRROLATE 1 MG PO TABS: 1.0000 mg | ORAL_TABLET | ORAL | Status: DC | PRN

## 2021-10-27 MED ORDER — MIDAZOLAM-SODIUM CHLORIDE 100-0.9 MG/100ML-% IV SOLN
0.0000 mg/h | INTRAVENOUS | Status: DC
  Administered 2021-10-27: 1 mg/h via INTRAVENOUS
  Filled 2021-10-27: qty 100

## 2021-10-27 MED ORDER — MIDAZOLAM HCL 2 MG/2ML IJ SOLN: 1.0000 mg | INTRAMUSCULAR | Status: DC | PRN

## 2021-10-27 MED ORDER — FENTANYL 2500MCG IN NS 250ML (10MCG/ML) PREMIX INFUSION
0.0000 ug/h | INTRAVENOUS | Status: DC
  Administered 2021-10-27: 200 ug/h via INTRAVENOUS
  Administered 2021-10-27: 100 ug/h via INTRAVENOUS
  Administered 2021-10-28: 200 ug/h via INTRAVENOUS
  Administered 2021-10-28 (×2): 250 ug/h via INTRAVENOUS
  Administered 2021-10-29 (×2): 200 ug/h via INTRAVENOUS
  Administered 2021-10-30: 225 ug/h via INTRAVENOUS
  Administered 2021-10-30: 200 ug/h via INTRAVENOUS
  Filled 2021-10-27 (×8): qty 250

## 2021-10-27 MED ORDER — POLYVINYL ALCOHOL 1.4 % OP SOLN: 1.0000 [drp] | Freq: Four times a day (QID) | OPHTHALMIC | Status: DC | PRN

## 2021-10-27 MED ORDER — ACETAMINOPHEN 160 MG/5ML PO SOLN
650.0000 mg | Freq: Four times a day (QID) | ORAL | Status: DC | PRN
  Administered 2021-10-27: 650 mg
  Filled 2021-10-27: qty 20.3

## 2021-10-27 MED ORDER — GLYCOPYRROLATE 0.2 MG/ML IJ SOLN: 0.2000 mg | INTRAMUSCULAR | Status: DC | PRN

## 2021-10-27 MED ORDER — ACETAMINOPHEN 325 MG PO TABS: 650.0000 mg | ORAL_TABLET | Freq: Four times a day (QID) | ORAL | Status: DC | PRN

## 2021-10-27 MED ORDER — ACETAMINOPHEN 650 MG RE SUPP
650.0000 mg | Freq: Four times a day (QID) | RECTAL | Status: DC | PRN
  Administered 2021-10-29 (×2): 650 mg via RECTAL
  Filled 2021-10-27 (×3): qty 1

## 2021-10-27 NOTE — Progress Notes (Signed)
Patient extubated with comfort care measures to RA. RN at bedside. Patient resting comfortably.

## 2021-10-27 NOTE — Progress Notes (Signed)
Patient arrived to floor. Family members at bedside. Patient comfortable and resting. Fentanyl drip at 68ms per hour confirmed. Comfort tray ordered.

## 2021-10-27 NOTE — Progress Notes (Signed)
eLink Physician-Brief Progress Note Patient Name: Edwena Mayorga DOB: 08/10/1950 MRN: [id number]   Date of Service  10/27/2021  HPI/Events of Note  Fever - Nursing request for Tylenol. AST and ALT both normal.   eICU Interventions  Plan: Tylenol liquid 650 mg per tube Q 6 hours PRN Temp > 101.0 F.      Intervention Category Major Interventions: Other:  Lysle Dingwall 10/27/2021, 12:34 AM

## 2021-10-27 NOTE — Progress Notes (Signed)
HISTORY & PHYSICAL (NOT CONSULT)     NAME:  Shelly Farrell, MRN:  [id number], DOB:  03-May-1950, LOS: 1 ADMISSION DATE:  10/20/2021, CONSULTATION DATE:  10/10/2021 REFERRING MD:  Dr. Jeanell Sparrow- ED, CHIEF COMPLAINT:  ICH   History of Present Illness:  71 year old female who was brought to the ED via EMS for right-sided weakness, aphasia, and altered mental status.  Per documentation, her last known normal was 2100 10/23 pm.  However, husband at bedside states she woke up in her normal state of health.  Despite timing, there was a change in her mental status and strength that prompted the husband to call EMS.  Code stroke was activated prior to arrival.    In the ED, her systolic BP was in the 202'R.  Her CT head showed a large left frontal ICH along with an extensive subarachnoid hemorrhage.  She was given Cleviprex and Labetalol for increasing hypertension.  She was subsequently intubated due to inability to protect her airway.  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  Neurology evaluated the patient and determined there was no intervention possible and the bleed was not survivable.  NIHSS 29.   The family opted to make the patient a DNR, as they want her to be comfortable. The family would like to take care of some social concerns prior to extubation.  PCCM was consulted for admission to the ICU given she is currently on the ventilator.    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   Objective   Blood pressure (!) 133/58, pulse 79, temperature (!) 100.7 F (38.2 C),  temperature source Axillary, resp. rate 18, height '5\' 4"'$  (1.626 m), weight 59.2 kg, SpO2 100 %.    Vent Mode: PRVC FiO2 (%):  [40 %-100 %] 40 % Set Rate:  [18 bmp-20 bmp] 18 bmp Vt Set:  [440 mL] 440 mL PEEP:  [5 cmH20] 5 cmH20 Plateau Pressure:  [15 cmH20-16 cmH20] 16 cmH20   Intake/Output Summary (Last 24 hours) at 10/27/2021 0809 Last data filed at 10/27/2021 0600 Gross per 24 hour  Intake 587.97 ml  Output 1950 ml  Net -1362.03 ml   Filed Weights   10/24/2021 1510 10/27/21 0144  Weight: 59 kg 59.2 kg    Examination: General: elderly female lying in bed on vent, critically ill but in NAD HEENT: MM pink/moist, ETT, anicteric Neuro: sedate, withdrawals to pain on LUE, LLE, RLE, posturing on RUE, +gag/cough CV: RRR, SR on monitor, no m/r/g PULM: non-labored, CTAB GI: soft, bsx4 active  Extremities: warm/dry, no edema  Skin: Warm, dry   Assessment & Plan:   Intraparenchymal Hemorrhage / Subarachnoid Hemorrhage Acute Metabolic Encephalopathy in setting of ICH  Acute Respiratory Failure secondary to [place]  Hyperglycemia  Hx Alzheimer's Dementia, Anxiety, Depression Comfort Measures / Palliative Care  CT head revealed large L frontal IPH with 20m midline shift and extensive SAH  - DNR while awaiting further family to arrive today then likely to transition to comfort measures - Promote all measures that focus on patient comfort / symptom relief  - Fentanyl / versed IV for pain/sedation  - Continue IV Cleviprex/Keppra per Neurology -  Emotional support offered  Best Practice (right click and "Reselect all SmartList Selections" daily)  Diet/type: NPO DVT prophylaxis: not indicated GI prophylaxis: N/A Lines: N/A Foley:  N/A Code Status:  DNR Last date of multidisciplinary goals of care discussion [10/24]  Critical care time: 30 minutes     Montey Hora, [place] For pager details, please see AMION or use Epic chat  After 1900,  please call [place] for cross coverage needs 10/27/2021, 8:17 AM

## 2021-10-28 DIAGNOSIS — I619 Nontraumatic intracerebral hemorrhage, unspecified: Secondary | ICD-10-CM | POA: Diagnosis not present

## 2021-10-28 NOTE — Progress Notes (Signed)
PROGRESS NOTE    Shelly Farrell  YWV:[id number] DOB: Dec 11, 1950 DOA: 10/05/2021 PCP: Medicine, [facility]   Brief Narrative:  71 year old female who was brought to the ED via EMS for right-sided weakness, aphasia, and altered mental status.  Per documentation, her last known normal was 2100 10/23 pm.   In the ED, her systolic BP was in the 854'O.  Her CT head showed a large left frontal ICH along with an extensive subarachnoid hemorrhage.  She was given Cleviprex and Labetalol for increasing hypertension.  She was subsequently intubated due to inability to protect her airway.  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  Neurology evaluated the patient and determined there was no intervention possible and the bleed was not survivable.  NIHSS 29.    PCCM was consulted for admission to the ICU given she was intubated.  Subsequently due to grave prognosis, family chose DNR and extubation and comfort care.  Patient was eventually transitioned under [place] on 10/28/2021.    Assessment & Plan:   Principal Problem:   ICH (intracerebral hemorrhage) ([place]) Active Problems:   Acute hypoxic respiratory failure (HCC)   DNR (do not resuscitate)   Midline shift of brain with brain compression ([place])  Intraparenchymal Hemorrhage / Subarachnoid Hemorrhage Acute Metabolic Encephalopathy in setting of ICH  Acute Respiratory Failure secondary to [place]  Hyperglycemia  Hx Alzheimer's Dementia, Anxiety, Depression Comfort Measures / Palliative Care  CT head revealed large L frontal IPH with 3m midline shift and extensive SAH.  Family chose extubation, comfort care.  Patient remains on fentanyl drip.  Currently comfortable.  Near death.  Husband at the bedside.  DVT prophylaxis:    Code Status: DNR  Family Communication: Husband present at bedside.   Status is: Inpatient Remains inpatient  appropriate because: Patient actively passing.   Estimated body mass index is 22.4 kg/m as calculated from the following:   Height as of this encounter: '5\' 4"'$  (1.626 m).   Weight as of this encounter: 59.2 kg.    Nutritional Assessment: Body mass index is 22.4 kg/m..Marland KitchenSeen by dietician.  I agree with the assessment and plan as outlined below: Nutrition Status:        . Skin Assessment: I have examined the patient's skin and I agree with the wound assessment as performed by the wound care RN as outlined below:    Consultants:  Neurology  Procedures:  None  Antimicrobials:  Anti-infectives (From admission, onward)    None         Subjective: Patient seen and examined.  She was sedated and appeared peaceful.  Husband at the bedside.  Objective: Vitals:   10/27/21 1500 10/27/21 1600 10/27/21 1700 10/27/21 1803  BP: (!) 175/75 (!) 175/126 (!) 180/74 (!) 182/66  Pulse: 80 82 86 88  Resp: 20 (!) 25 (!) 30 16  Temp:    98.5 F (36.9 C)  TempSrc:    Oral  SpO2: 90% (!) 83% (!) 71% (!) 53%  Weight:      Height:       No intake or output data in the 24 hours ending 10/28/21 1425 Filed Weights   10/25/2021 1510 10/27/21 0144  Weight: 59 kg 59.2 kg    Examination:  General exam: Appears calm and comfortable  Respiratory system: Clear to auscultation. Respiratory effort normal. Cardiovascular system: S1 & S2 heard, RRR. No JVD, murmurs, rubs, gallops or clicks.  No pedal edema. Gastrointestinal system: Abdomen is nondistended, soft and nontender. No organomegaly or masses felt. Normal bowel sounds heard.  Data Reviewed: I have personally reviewed following labs and imaging studies  CBC: Recent Labs  Lab 10/16/2021 0930 10/17/2021 1047  WBC 16.5*  --   NEUTROABS 13.0*  --   HGB 12.4  12.2 10.9*  HCT 37.7  36.0 32.0*  MCV 95.0  --   PLT 219  --    Basic Metabolic Panel: Recent Labs  Lab 10/27/2021 0930 10/03/2021 1047  NA 141  140 138  K 3.7  3.8 3.6   CL 109  107  --   CO2 21*  --   GLUCOSE 153*  156*  --   BUN 12  14  --   CREATININE 0.80  0.70  --   CALCIUM 8.9  --    GFR: Estimated Creatinine Clearance: 56.5 mL/min (by C-G formula based on SCr of 0.8 mg/dL). Liver Function Tests: Recent Labs  Lab 10/21/2021 0930  AST 18  ALT 9  ALKPHOS 124  BILITOT 0.5  PROT 6.2*  ALBUMIN 3.7   No results for input(s): "LIPASE", "AMYLASE" in the last 168 hours. No results for input(s): "AMMONIA" in the last 168 hours. Coagulation Profile: Recent Labs  Lab 10/16/2021 0930  INR 1.0   Cardiac Enzymes: No results for input(s): "CKTOTAL", "CKMB", "CKMBINDEX", "TROPONINI" in the last 168 hours. BNP (last 3 results) No results for input(s): "PROBNP" in the last 8760 hours. HbA1C: No results for input(s): "HGBA1C" in the last 72 hours. CBG: Recent Labs  Lab 10/21/2021 0920  GLUCAP 147*   Lipid Profile: No results for input(s): "CHOL", "HDL", "LDLCALC", "TRIG", "CHOLHDL", "LDLDIRECT" in the last 72 hours. Thyroid Function Tests: No results for input(s): "TSH", "T4TOTAL", "FREET4", "T3FREE", "THYROIDAB" in the last 72 hours. Anemia Panel: No results for input(s): "VITAMINB12", "FOLATE", "FERRITIN", "TIBC", "IRON", "RETICCTPCT" in the last 72 hours. Sepsis Labs: No results for input(s): "PROCALCITON", "LATICACIDVEN" in the last 168 hours.  Recent Results (from the past 240 hour(s))  SARS Coronavirus 2 by RT PCR (hospital order, performed in [facility] hospital lab) *cepheid single result test* Anterior Nasal Swab     Status: None   Collection Time: 11/02/2021 11:04 AM   Specimen: Anterior Nasal Swab  Result Value Ref Range Status   SARS Coronavirus 2 by RT PCR NEGATIVE NEGATIVE Final    Comment: (NOTE) SARS-CoV-2 target nucleic acids are NOT DETECTED.  The SARS-CoV-2 RNA is generally detectable in upper and lower respiratory specimens during the acute phase of infection. The lowest concentration of SARS-CoV-2 viral copies this  assay can detect is 250 copies / mL. A negative result does not preclude SARS-CoV-2 infection and should not be used as the sole basis for treatment or other patient management decisions.  A negative result may occur with improper specimen collection / handling, submission of specimen other than nasopharyngeal swab, presence of viral mutation(s) within the areas targeted by this assay, and inadequate number of viral copies (<250 copies / mL). A negative result must be combined with clinical observations, patient history, and epidemiological information.  Fact Sheet for Patients:   https://www.patel.info/  Fact Sheet for Healthcare Providers: https://hall.com/  This test is not yet approved or  cleared by the [place] FDA and has been authorized for detection and/or diagnosis of SARS-CoV-2 by FDA under an Emergency Use Authorization (EUA).  This EUA will remain in effect (meaning this test can be used) for the duration  of the COVID-19 declaration under Section 564(b)(1) of the Act, 21 U.S.C. section 360bbb-3(b)(1), unless the authorization is terminated or revoked sooner.  Performed at [facility], [place] [address], [place], [place] [address]   MRSA Next Gen by PCR, Nasal     Status: None   Collection Time: 10/20/2021  2:43 PM   Specimen: Nasal Mucosa; Nasal Swab  Result Value Ref Range Status   MRSA by PCR Next Gen NOT DETECTED NOT DETECTED Final    Comment: (NOTE) The GeneXpert MRSA Assay (FDA approved for NASAL specimens only), is one component of a comprehensive MRSA colonization surveillance program. It is not intended to diagnose MRSA infection nor to guide or monitor treatment for MRSA infections. Test performance is not FDA approved in patients less than 14 years old. Performed at [facility], [place] [address], [place], [place] [address]      Radiology Studies: No results found.  Scheduled Meds:  sodium  chloride flush  3 mL Intravenous Once   Continuous Infusions:  clevidipine 4 mg/hr (10/27/21 1000)   fentaNYL infusion INTRAVENOUS 250 mcg/hr (10/28/21 0825)   levETIRAcetam 500 mg (10/28/21 1011)   midazolam 1 mg/hr (10/27/21 1321)     LOS: 2 days   Darliss Cheney, MD [facility]  10/28/2021, 2:25 PM   *Please note that this is a verbal dictation therefore any spelling or grammatical errors are due to the "[place] One" system interpretation.  Please page via [place] and do not message via secure chat for urgent patient care matters. Secure chat can be used for non urgent patient care matters.  How to contact the [facility] Attending or Consulting provider [place] or covering provider during after hours [place], for this patient?  Check the care team in [facility] and look for a) attending/consulting TRH provider listed and b) the [facility] team listed. Page or secure chat [phone]. Log into [email or web address] and use [place]'s universal password to access. If you do not have the password, please contact the hospital operator. Locate the [facility] provider you are looking for under [facility] and page to a number that you can be directly reached. If you still have difficulty reaching the provider, please page the [facility] (Director on Call) for the Hospitalists listed on amion for assistance.

## 2021-10-29 DIAGNOSIS — I619 Nontraumatic intracerebral hemorrhage, unspecified: Secondary | ICD-10-CM | POA: Diagnosis not present

## 2021-10-29 NOTE — Care Management Important Message (Signed)
Important Message  Patient Details  Name: Shelly Farrell MRN: [id number] Date of Birth: 10-28-1950   Medicare Important Message Given:  No     [name] 10/29/2021, 2:38 PM

## 2021-10-29 NOTE — Progress Notes (Signed)
PROGRESS NOTE    Shelly Farrell  TSV:[id number] DOB: [birth year] DOA: 10/25/2021 PCP: Medicine, [facility]   Brief Narrative:  70 year old female who was brought to the ED via EMS for right-sided weakness, aphasia, and altered mental status.  Per documentation, her last known normal was 2100 10/23 pm.   In the ED, her systolic BP was in the 923'R.  Her CT head showed a large left frontal ICH along with an extensive subarachnoid hemorrhage.  She was given Cleviprex and Labetalol for increasing hypertension.  She was subsequently intubated due to inability to protect her airway.  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  Neurology evaluated the patient and determined there was no intervention possible and the bleed was not survivable.  NIHSS 29.    PCCM was consulted for admission to the ICU given she was intubated.  Subsequently due to grave prognosis, family chose DNR and extubation and comfort care.  Patient was eventually transitioned under [place] on 10/28/2021.    Assessment & Plan:   Principal Problem:   ICH (intracerebral hemorrhage) ([place]) Active Problems:   Acute hypoxic respiratory failure (HCC)   DNR (do not resuscitate)   Midline shift of brain with brain compression ([place])  Intraparenchymal Hemorrhage / Subarachnoid Hemorrhage Acute Metabolic Encephalopathy in setting of ICH  Acute Respiratory Failure secondary to [place]  Hyperglycemia  Hx Alzheimer's Dementia, Anxiety, Depression Comfort Measures / Palliative Care  CT head revealed large L frontal IPH with 80m midline shift and extensive SAH.  Family chose extubation, comfort care.  Patient remains on fentanyl drip.  Currently comfortable.  Near death.  Husband at the bedside.  DVT prophylaxis:    Code Status: DNR  Family Communication: Husband present at bedside.   Status is: Inpatient Remains inpatient  appropriate because: Patient actively passing.   Estimated body mass index is 46.66 kg/m as calculated from the following:   Height as of this encounter: '5\' 4"'$  (1.626 m).   Weight as of this encounter: 123.3 kg.    Nutritional Assessment: Body mass index is 46.66 kg/m..Marland KitchenSeen by dietician.  I agree with the assessment and plan as outlined below: Nutrition Status:        . Skin Assessment: I have examined the patient's skin and I agree with the wound assessment as performed by the wound care RN as outlined below:    Consultants:  Neurology  Procedures:  None  Antimicrobials:  Anti-infectives (From admission, onward)    None         Subjective: Patient seen and examined.  Appears to be very comfortable.  Objective: Vitals:   10/28/21 1953 10/29/21 0348 10/29/21 0500 10/29/21 0800  BP: (!) 159/64   (!) 144/75  Pulse: (!) 105   (!) 125  Resp: 19   16  Temp: 99.8 F (37.7 C) (!) 101.5 F (38.6 C)  98 F (36.7 C)  TempSrc: Oral Oral  Oral  SpO2: (!) 66%   (!) 80%  Weight:   123.3 kg   Height:        Intake/Output Summary (Last 24 hours) at 10/29/2021 1344 Last data filed at 10/28/2021 1953 Gross per 24 hour  Intake --  Output 700 ml  Net -700 ml   Filed Weights   10/16/2021 1510 10/27/21 0144 10/29/21 0500  Weight: 59 kg 59.2 kg 123.3 kg    Examination:  General exam: Appears calm and comfortable  Respiratory system: Clear to auscultation. Respiratory effort normal. Cardiovascular system: S1 & S2 heard, RRR. No JVD, murmurs, rubs, gallops or clicks. No pedal edema. Gastrointestinal system: Abdomen is nondistended, soft and nontender. No organomegaly or masses felt. Normal bowel sounds heard.  Data Reviewed: I have personally reviewed following labs and imaging studies  CBC: Recent Labs  Lab 10/21/2021 0930 10/13/2021 1047  WBC 16.5*  --   NEUTROABS 13.0*  --   HGB 12.4  12.2 10.9*  HCT 37.7  36.0 32.0*  MCV 95.0  --   PLT 219  --      Basic Metabolic Panel: Recent Labs  Lab 10/16/2021 0930 10/11/2021 1047  NA 141  140 138  K 3.7  3.8 3.6  CL 109  107  --   CO2 21*  --   GLUCOSE 153*  156*  --   BUN 12  14  --   CREATININE 0.80  0.70  --   CALCIUM 8.9  --     GFR: Estimated Creatinine Clearance: 84.8 mL/min (by C-G formula based on SCr of 0.8 mg/dL). Liver Function Tests: Recent Labs  Lab 10/28/2021 0930  AST 18  ALT 9  ALKPHOS 124  BILITOT 0.5  PROT 6.2*  ALBUMIN 3.7    No results for input(s): "LIPASE", "AMYLASE" in the last 168 hours. No results for input(s): "AMMONIA" in the last 168 hours. Coagulation Profile: Recent Labs  Lab 10/29/2021 0930  INR 1.0    Cardiac Enzymes: No results for input(s): "CKTOTAL", "CKMB", "CKMBINDEX", "TROPONINI" in the last 168 hours. BNP (last 3 results) No results for input(s): "PROBNP" in the last 8760 hours. HbA1C: No results for input(s): "HGBA1C" in the last 72 hours. CBG: Recent Labs  Lab 10/25/2021 0920  GLUCAP 147*    Lipid Profile: No results for input(s): "CHOL", "HDL", "LDLCALC", "TRIG", "CHOLHDL", "LDLDIRECT" in the last 72 hours. Thyroid Function Tests: No results for input(s): "TSH", "T4TOTAL", "FREET4", "T3FREE", "THYROIDAB" in the last 72 hours. Anemia Panel: No results for input(s): "VITAMINB12", "FOLATE", "FERRITIN", "TIBC", "IRON", "RETICCTPCT" in the last 72 hours. Sepsis Labs: No results for input(s): "PROCALCITON", "LATICACIDVEN" in the last 168 hours.  Recent Results (from the past 240 hour(s))  SARS Coronavirus 2 by RT PCR (hospital order, performed in [facility] hospital lab) *cepheid single result test* Anterior Nasal Swab     Status: None   Collection Time: 11/02/2021 11:04 AM   Specimen: Anterior Nasal Swab  Result Value Ref Range Status   SARS Coronavirus 2 by RT PCR NEGATIVE NEGATIVE Final    Comment: (NOTE) SARS-CoV-2 target nucleic acids are NOT DETECTED.  The SARS-CoV-2 RNA is generally detectable in upper and  lower respiratory specimens during the acute phase of infection. The lowest concentration of SARS-CoV-2 viral copies this assay can detect is 250 copies / mL. A negative result does not preclude SARS-CoV-2 infection and should not be used as the sole basis for treatment or other patient management decisions.  A negative result may occur with improper specimen collection / handling, submission of specimen other than nasopharyngeal swab, presence of viral mutation(s) within the areas targeted by this assay, and inadequate number of viral copies (<250 copies / mL). A negative result must be combined with clinical observations, patient history, and epidemiological information.  Fact Sheet for Patients:   https://www.patel.info/  Fact Sheet for Healthcare Providers: https://hall.com/  This test is not yet approved or  cleared by the [place] FDA and has been authorized for detection and/or  diagnosis of SARS-CoV-2 by FDA under an Emergency Use Authorization (EUA).  This EUA will remain in effect (meaning this test can be used) for the duration of the COVID-19 declaration under Section 564(b)(1) of the Act, 21 U.S.C. section 360bbb-3(b)(1), unless the authorization is terminated or revoked sooner.  Performed at [facility], [place] [address], [place], [place] [address]   MRSA Next Gen by PCR, Nasal     Status: None   Collection Time: 10/18/2021  2:43 PM   Specimen: Nasal Mucosa; Nasal Swab  Result Value Ref Range Status   MRSA by PCR Next Gen NOT DETECTED NOT DETECTED Final    Comment: (NOTE) The GeneXpert MRSA Assay (FDA approved for NASAL specimens only), is one component of a comprehensive MRSA colonization surveillance program. It is not intended to diagnose MRSA infection nor to guide or monitor treatment for MRSA infections. Test performance is not FDA approved in patients less than 41 years old. Performed at [place], [place] [address], [place], [place] [address]      Radiology Studies: No results found.  Scheduled Meds:  sodium chloride flush  3 mL Intravenous Once   Continuous Infusions:  clevidipine 4 mg/hr (10/27/21 1000)   fentaNYL infusion INTRAVENOUS 200 mcg/hr (10/29/21 0709)   levETIRAcetam 500 mg (10/29/21 1022)   midazolam 1 mg/hr (10/27/21 1321)     LOS: 3 days   Darliss Cheney, MD [facility]  10/29/2021, 1:44 PM   *Please note that this is a verbal dictation therefore any spelling or grammatical errors are due to the "[place] One" system interpretation.  Please page via [place] and do not message via secure chat for urgent patient care matters. Secure chat can be used for non urgent patient care matters.  How to contact the [facility] Attending or Consulting provider [place] or covering provider during after hours [place], for this patient?  Check the care team in [facility] and look for a) attending/consulting TRH provider listed and b) the [facility] team listed. Page or secure chat [phone]. Log into [email or web address] and use [place]'s universal password to access. If you do not have the password, please contact the hospital operator. Locate the [facility] provider you are looking for under [facility] and page to a number that you can be directly reached. If you still have difficulty reaching the provider, please page the [facility] (Director on Call) for the Hospitalists listed on amion for assistance.

## 2021-10-29 NOTE — Care Management Important Message (Signed)
Important Message  Patient Details  Name: Shelly Farrell MRN: [id number] Date of Birth: 04-15-1950   Medicare Important Message Given:  No     [name] 10/29/2021, 2:37 PM

## 2021-10-29 NOTE — Progress Notes (Signed)
10/27 Pt under Comfort Measures, IM Letter respectfully not given.

## 2021-10-29 NOTE — Progress Notes (Signed)
Nursing Note: 10/29/21 Amy Korslien and Karren Burly wasted 33m's of Fentanyl in stericycle from a bag that needed to be disposed.

## 2021-10-30 DIAGNOSIS — I619 Nontraumatic intracerebral hemorrhage, unspecified: Secondary | ICD-10-CM | POA: Diagnosis not present

## 2021-10-30 NOTE — Progress Notes (Signed)
PROGRESS NOTE    Shelly Farrell  QPY:[id number] DOB: Mar 20, 1950 DOA: 10/15/2021 PCP: Medicine, [facility]   Brief Narrative:  71 year old female who was brought to the ED via EMS for right-sided weakness, aphasia, and altered mental status.  Per documentation, her last known normal was 2100 10/23 pm.   In the ED, her systolic BP was in the 124'P.  Her CT head showed a large left frontal ICH along with an extensive subarachnoid hemorrhage.  She was given Cleviprex and Labetalol for increasing hypertension.  She was subsequently intubated due to inability to protect her airway.  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  Neurology evaluated the patient and determined there was no intervention possible and the bleed was not survivable.  NIHSS 29.    PCCM was consulted for admission to the ICU given she was intubated.  Subsequently due to grave prognosis, family chose DNR and extubation and comfort care.  Patient was eventually transitioned under [place] on 10/28/2021.    Assessment & Plan:   Principal Problem:   ICH (intracerebral hemorrhage) ([place]) Active Problems:   Acute hypoxic respiratory failure (HCC)   DNR (do not resuscitate)   Midline shift of brain with brain compression ([place])  Intraparenchymal Hemorrhage / Subarachnoid Hemorrhage Acute Metabolic Encephalopathy in setting of ICH  Acute Respiratory Failure secondary to [place]  Hyperglycemia  Hx Alzheimer's Dementia, Anxiety, Depression Comfort Measures / Palliative Care  CT head revealed large L frontal IPH with 39m midline shift and extensive SAH.  Family chose extubation, comfort care.  Patient remains on fentanyl drip.  Currently comfortable.  Near death.  Husband at the bedside.  DVT prophylaxis:    Code Status: DNR  Family Communication: Husband present at bedside.   Status is: Inpatient Remains inpatient  appropriate because: Patient actively passing.   Estimated body mass index is 46.66 kg/m as calculated from the following:   Height as of this encounter: '5\' 4"'$  (1.626 m).   Weight as of this encounter: 123.3 kg.    Nutritional Assessment: Body mass index is 46.66 kg/m..Marland KitchenSeen by dietician.  I agree with the assessment and plan as outlined below: Nutrition Status:        . Skin Assessment: I have examined the patient's skin and I agree with the wound assessment as performed by the wound care RN as outlined below:    Consultants:  Neurology  Procedures:  None  Antimicrobials:  Anti-infectives (From admission, onward)    None         Subjective:  Patient seen and examined.  She was placed on oxygen last night per family's request.  Patient appears comfortable.  Objective: Vitals:   10/29/21 2044 10/29/21 2054 10/29/21 2244 11/02/2021 0009  BP:    (!) 182/87  Pulse:    99  Resp:   17 12  Temp: (!) 102.2 F (39 C) (!) 102 F (38.9 C) (!) 100.5 F (38.1 C) 99.5 F (37.5 C)  TempSrc: Oral Oral Oral Oral  SpO2:    (!) 82%  Weight:      Height:        Intake/Output Summary (Last 24 hours) at 10/25/2021 1123 Last data filed at 10/05/2021 0804 Gross per 24 hour  Intake 1518.14 ml  Output 1600 ml  Net -81.86 ml    Filed Weights   10/05/2021 1510 10/27/21 0144 10/29/21 0500  Weight: 59 kg 59.2 kg 123.3  kg    Examination:  General exam: Appears calm and comfortable  Respiratory system: Clear to auscultation. Respiratory effort normal. Cardiovascular system: S1 & S2 heard, RRR. No JVD, murmurs, rubs, gallops or clicks. No pedal edema. Gastrointestinal system: Abdomen is nondistended, soft and nontender. No organomegaly or masses felt. Normal bowel sounds heard.  Data Reviewed: I have personally reviewed following labs and imaging studies  CBC: Recent Labs  Lab 10/29/2021 0930 10/03/2021 1047  WBC 16.5*  --   NEUTROABS 13.0*  --   HGB 12.4  12.2 10.9*   HCT 37.7  36.0 32.0*  MCV 95.0  --   PLT 219  --     Basic Metabolic Panel: Recent Labs  Lab 10/25/2021 0930 10/05/2021 1047  NA 141  140 138  K 3.7  3.8 3.6  CL 109  107  --   CO2 21*  --   GLUCOSE 153*  156*  --   BUN 12  14  --   CREATININE 0.80  0.70  --   CALCIUM 8.9  --     GFR: Estimated Creatinine Clearance: 84.8 mL/min (by C-G formula based on SCr of 0.8 mg/dL). Liver Function Tests: Recent Labs  Lab 10/13/2021 0930  AST 18  ALT 9  ALKPHOS 124  BILITOT 0.5  PROT 6.2*  ALBUMIN 3.7    No results for input(s): "LIPASE", "AMYLASE" in the last 168 hours. No results for input(s): "AMMONIA" in the last 168 hours. Coagulation Profile: Recent Labs  Lab 10/29/2021 0930  INR 1.0    Cardiac Enzymes: No results for input(s): "CKTOTAL", "CKMB", "CKMBINDEX", "TROPONINI" in the last 168 hours. BNP (last 3 results) No results for input(s): "PROBNP" in the last 8760 hours. HbA1C: No results for input(s): "HGBA1C" in the last 72 hours. CBG: Recent Labs  Lab 10/28/2021 0920  GLUCAP 147*    Lipid Profile: No results for input(s): "CHOL", "HDL", "LDLCALC", "TRIG", "CHOLHDL", "LDLDIRECT" in the last 72 hours. Thyroid Function Tests: No results for input(s): "TSH", "T4TOTAL", "FREET4", "T3FREE", "THYROIDAB" in the last 72 hours. Anemia Panel: No results for input(s): "VITAMINB12", "FOLATE", "FERRITIN", "TIBC", "IRON", "RETICCTPCT" in the last 72 hours. Sepsis Labs: No results for input(s): "PROCALCITON", "LATICACIDVEN" in the last 168 hours.  Recent Results (from the past 240 hour(s))  SARS Coronavirus 2 by RT PCR (hospital order, performed in [facility] hospital lab) *cepheid single result test* Anterior Nasal Swab     Status: None   Collection Time: 10/23/2021 11:04 AM   Specimen: Anterior Nasal Swab  Result Value Ref Range Status   SARS Coronavirus 2 by RT PCR NEGATIVE NEGATIVE Final    Comment: (NOTE) SARS-CoV-2 target nucleic acids are NOT DETECTED.  The  SARS-CoV-2 RNA is generally detectable in upper and lower respiratory specimens during the acute phase of infection. The lowest concentration of SARS-CoV-2 viral copies this assay can detect is 250 copies / mL. A negative result does not preclude SARS-CoV-2 infection and should not be used as the sole basis for treatment or other patient management decisions.  A negative result may occur with improper specimen collection / handling, submission of specimen other than nasopharyngeal swab, presence of viral mutation(s) within the areas targeted by this assay, and inadequate number of viral copies (<250 copies / mL). A negative result must be combined with clinical observations, patient history, and epidemiological information.  Fact Sheet for Patients:   https://www.patel.info/  Fact Sheet for Healthcare Providers: https://hall.com/  This test is not yet approved or  cleared by the [place] and has been authorized for detection and/or diagnosis of SARS-CoV-2 by FDA under an Emergency Use Authorization (EUA).  This EUA will remain in effect (meaning this test can be used) for the duration of the COVID-19 declaration under Section 564(b)(1) of the Act, 21 U.S.C. section 360bbb-3(b)(1), unless the authorization is terminated or revoked sooner.  Performed at [facility], [place] [address], [place], [place] [address]   MRSA Next Gen by PCR, Nasal     Status: None   Collection Time: 10/22/2021  2:43 PM   Specimen: Nasal Mucosa; Nasal Swab  Result Value Ref Range Status   MRSA by PCR Next Gen NOT DETECTED NOT DETECTED Final    Comment: (NOTE) The GeneXpert MRSA Assay (FDA approved for NASAL specimens only), is one component of a comprehensive MRSA colonization surveillance program. It is not intended to diagnose MRSA infection nor to guide or monitor treatment for MRSA infections. Test performance is not FDA approved in patients less  than 24 years old. Performed at [facility], [place] [address], [place], [place] [address]      Radiology Studies: No results found.  Scheduled Meds:   Continuous Infusions:  clevidipine Stopped (10/27/21 1204)   fentaNYL infusion INTRAVENOUS 225 mcg/hr (10/17/2021 1003)   levETIRAcetam 500 mg (10/29/2021 9381)   midazolam Stopped (10/27/21 1741)     LOS: 4 days   Darliss Cheney, MD [facility]  10/20/2021, 11:23 AM   *Please note that this is a verbal dictation therefore any spelling or grammatical errors are due to the "[place] [facility]" system interpretation.  Please page via [place] and do not message via secure chat for urgent patient care matters. Secure chat can be used for non urgent patient care matters.  How to contact the [facility] Attending or Consulting provider [place] or covering provider during after hours [place], for this patient?  Check the care team in [facility] and look for a) attending/consulting TRH provider listed and b) the [facility] team listed. Page or secure chat [phone]. Log into [email or web address] and use [place]'s universal password to access. If you do not have the password, please contact the hospital operator. Locate the [facility] provider you are looking for under [facility] and page to a number that you can be directly reached. If you still have difficulty reaching the provider, please page the [facility] (Director on Call) for the Hospitalists listed on amion for assistance.

## 2021-10-30 NOTE — Progress Notes (Signed)
   10/23/2021 1315  Clinical Encounter Type  Visited With Patient;Family  Visit Type Initial;Patient actively dying   Pt. received page from [facility] staff requesting support for family; when [facility] arrived, pt. lying in bed seeming to be resting comfortably asleep.  Pt.'s husband Leroy Sea and son Ruby Cola at bedside.  In extended visit, they shared that pt. has been dealing with Altzheimers disease for the past year; pt. suffered a stroke this past week, husband shared, and then suffered a subsequent hemorrhage in her brain.  Pt. has been on comfort care since 10/26 and family shared that they are very worn out.  Son shared he does not want to leave pt. for fear that she will die when he is out of the room.  CH provided active listening and prayer.  Family open to follow-up support.  Lindaann Pascal, Chaplain Pager: [phone]

## 2021-10-30 NOTE — Progress Notes (Signed)
       CROSS COVER NOTE  NAME: Chrislyn Seedorf MRN: [id number] DOB : Jan 08, 1950     OVERNIGHT PROGRESS REPORT  Notified by RN that patient has expired at  2215   Patient was comfort care.   2 RN verified.  Family was present at bedside.    Raenette Rover, DNP, [place]

## 2021-11-03 NOTE — Death Summary Note (Signed)
DEATH SUMMARY   Patient Details  Name: Shelly Farrell MRN: [id number] DOB: [birth year] QJJ:HERDEYCX, [facility] Admission/Discharge Information   Admit Date:  November 23, 2021  Date of Death: Date of Death: November 27, 2021  Time of Death: Time of Death: 04-12-13  Length of Stay: 5   Principle Cause of death: Intraparenchymal Hemorrhage / Subarachnoid Hemorrhage  Hospital Diagnoses: Principal Problem:   ICH (intracerebral hemorrhage) ([place]) Active Problems:   Acute hypoxic respiratory failure ([place])   DNR (do not resuscitate)   Midline shift of brain with brain compression [facility])   Hospital Course: 71 year old female who was brought to the ED via EMS for right-sided weakness, aphasia, and altered mental status.  Per documentation, her last known normal was 12-Apr-2098 10/23 pm.    In the ED, her systolic BP was in the 448'J.  Her CT head showed a large left frontal ICH along with an extensive subarachnoid hemorrhage.  She was given Cleviprex and Labetalol for increasing hypertension.  She was subsequently intubated due to inability to protect her airway.  CTA revealed no evidence of aneurism, vascular malformation, and a right MCA branch vasospasm unchanged from prior CTA in 04-13-2019.  Neurology evaluated the patient and determined there was no intervention possible and the bleed was not survivable.  NIHSS 29.    PCCM was consulted for admission to the ICU given she was intubated.  Subsequently due to grave prognosis, family chose DNR and extubation and comfort care.  Patient was eventually transitioned under [place] on 10/28/2021.  Patient subsequently passed away on 27-Nov-2021 and April 12, 2213.  Assessment and Plan: No notes have been filed under this hospital service. Service: Hospitalist Intraparenchymal Hemorrhage / Subarachnoid Hemorrhage Acute Metabolic Encephalopathy in setting of ICH  Acute Respiratory Failure secondary to [place]  Hyperglycemia  Hx Alzheimer's Dementia, Anxiety,  Depression Comfort Measures / Palliative Care  CT head revealed large L frontal IPH with 42m midline shift and extensive SAH.  Family chose extubation, comfort care.       Procedures:   Consultations: Neurosurgery  The results of significant diagnostics from this hospitalization (including imaging, microbiology, ancillary and laboratory) are listed below for reference.   Significant Diagnostic Studies: CT ANGIO HEAD CODE STROKE  Result Date: 12023/11/21CLINICAL DATA:  71year old female code stroke presentation. Left hemispheric intra-axial and subarachnoid hemorrhage. EXAM: CT ANGIOGRAPHY HEAD TECHNIQUE: Multidetector CT imaging of the head was performed using the standard protocol during bolus administration of intravenous contrast. Multiplanar CT image reconstructions and MIPs were obtained to evaluate the vascular anatomy. RADIATION DOSE REDUCTION: This exam was performed according to the departmental dose-optimization program which includes automated exposure control, adjustment of the mA and/or kV according to patient size and/or use of iterative reconstruction technique. CONTRAST:  739mOMNIPAQUE IOHEXOL 350 MG/ML SOLN COMPARISON:  Plain head CT 0926 hours today. CTA head 03/08/2019 FINDINGS: Posterior circulation: Patent in stable distal vertebral arteries and vertebrobasilar junction. Mild distal vertebral plaque with no significant stenosis. Patent right PICA and dominant appearing left AICA origins. Patent basilar artery. Normal SCA and left PCA origins. Fetal type right PCA origin redemonstrated. Bilateral PCA branches are stable, mild chronic irregularity on the left. Anterior circulation: Distal cervical ICAs and both ICA siphons are patent and appear stable with mild calcified plaque greater on the right, only mild right siphon stenosis. Normal right posterior communicating artery origin. Patent carotid termini, MCA and ACA origins. Mildly dominant left ACA A1 segment as before.  Normal anterior communicating artery. Bilateral ACA branches are stable  and within normal limits. Left MCA M1 segment and trifurcation are patent without stenosis. Left MCA branches appear stable except for some evidence of generalized M3 basal spasm on series 14, image 25. Right MCA M1 segment and MCA bifurcation are patent without stenosis. Right MCA branches are stable and within normal limits. Venous sinuses: Insufficient contrast enhancement for evaluation today. Anatomic variants: Fetal type right PCA origin. Review of the MIP images confirms the above findings Other findings: Repeat plain head CT. Bulky and multifocal intra-axial hemorrhage in the left hemisphere throughout the superior frontal and parietal lobes. Increased hyperdensity of blood along the posterior hemorrhage margin. No definite CTA spot sign. Superimposed subarachnoid hemorrhage, and a small volume of contralateral right superior frontal gyrus contract seal hemorrhage (series 4, image 27). No intraventricular extension or ventriculomegaly. Cerebral edema and intracranial mass effect. Rightward midline shift at the mid cingulate gyrus is stable at roughly 7 mm. Basilar cisterns remain patent. No acute skull fracture identified. There is a chronic nondisplaced left occipital bone fracture unchanged since 2021. IMPRESSION: 1. Severe intracranial hemorrhage with no intracranial aneurysm, vascular malformation, or CTA spot sign. Some Left MCA branch vasospasm suspected, but otherwise stable intracranial CTA since 2021. 2. Stable intracranial mass effect since 0926 hours today. Basilar cisterns remain patent. No intraventricular extension or ventriculomegaly. Electronically Signed   By: Genevie Ann M.D.   On: 10/03/2021 10:37   DG Chest Portable 1 View  Result Date: 10/23/2021 CLINICAL DATA:  ETT placement EXAM: PORTABLE CHEST 1 VIEW COMPARISON:  Chest x-ray November 27, 2020. FINDINGS: ET tube approximately 3.6 cm above the carina. Gastric tube  courses below the diaphragm with the tip in the stomach. Side port is just above the GE junction. Streaky left basilar opacities. No visible pleural effusions or pneumothorax. No acute osseous abnormality. Polyarticular degenerative change. IMPRESSION: 1. ET tube approximately 3.6 cm above the carina. 2. Gastric tube courses below the diaphragm with the tip in the stomach. Side port is just above the GE junction. 3. Streaky left basilar opacities. See ordered/forthcoming CT for further evaluation. Electronically Signed   By: Margaretha Sheffield M.D.   On: 10/17/2021 10:12   CT HEAD CODE STROKE WO CONTRAST  Result Date: 10/22/2021 CLINICAL DATA:  Code stroke. Neuro deficit, acute, stroke suspected. EXAM: CT HEAD WITHOUT CONTRAST TECHNIQUE: Contiguous axial images were obtained from the base of the skull through the vertex without intravenous contrast. RADIATION DOSE REDUCTION: This exam was performed according to the departmental dose-optimization program which includes automated exposure control, adjustment of the mA and/or kV according to patient size and/or use of iterative reconstruction technique. COMPARISON:  Brain MRI 09/13/2019. Head CT 09/13/2019. FINDINGS: Brain: There is extensive acute hemorrhage in the region of the left frontal and parietal lobes, which appears both parenchymal and subarachnoid. An overall volume is difficult to provide given the irregularity of the hemorrhage. However, the largest focus of hemorrhage is located along the medial aspect of the left frontal lobe, measuring 8.2 x 2.9 cm (AP x TV). 2.4 cm acute parenchymal hemorrhage within the medial right frontal lobe. Trace subarachnoid hemorrhage scattered along the right frontoparietal convexity. Subarachnoid hemorrhage is also present within the interhemispheric fissure. Additionally, there is a possible 13 mm focus of acute parenchymal hemorrhage within the body of the corpus callosum (versus adjacent subarachnoid hemorrhage).  Mass effect related to the left-sided hemorrhages with 9 mm rightward midline shift. No intraventricular extension of hemorrhage is identified at this time. Mild patchy and ill-defined hypoattenuation within  the cerebral white matter, nonspecific but compatible with chronic small vessel ischemic disease. No acute demarcated cortical infarct is identified. Vascular: No hyperdense vessel.  Atherosclerotic calcifications. Skull: Redemonstrated nondepressed fracture of the left occipital calvarium. Sinuses/Orbits: No mass or acute finding within the imaged orbits. Mild mucosal thickening within the bilateral ethmoid, right sphenoid and left maxillary sinuses. These results were called by telephone at the time of interpretation on 10/09/2021 at 9:37 am to provider Dr. Quinn Axe, who verbally acknowledged these results. IMPRESSION: Extensive acute hemorrhage in the region of the left frontal and parietal lobes, which appears both parenchymal and subarachnoid. Associated mass effect with 9 mm rightward midline shift. No intraventricular extension of hemorrhage is identified at this time. Smaller acute parenchymal hemorrhages within the medial right frontal lobe, and possibly within the callosal body, as described. Trace subarachnoid hemorrhage scattered along the right frontoparietal convexity. Subarachnoid hemorrhage is also present within the interhemispheric fissure. Mild cerebral white matter chronic small vessel ischemic disease. Electronically Signed   By: Kellie Simmering D.O.   On: 10/16/2021 09:54    Microbiology: Recent Results (from the past 240 hour(s))  SARS Coronavirus 2 by RT PCR (hospital order, performed in [facility] hospital lab) *cepheid single result test* Anterior Nasal Swab     Status: None   Collection Time: 10/27/2021 11:04 AM   Specimen: Anterior Nasal Swab  Result Value Ref Range Status   SARS Coronavirus 2 by RT PCR NEGATIVE NEGATIVE Final    Comment: (NOTE) SARS-CoV-2 target nucleic acids are  NOT DETECTED.  The SARS-CoV-2 RNA is generally detectable in upper and lower respiratory specimens during the acute phase of infection. The lowest concentration of SARS-CoV-2 viral copies this assay can detect is 250 copies / mL. A negative result does not preclude SARS-CoV-2 infection and should not be used as the sole basis for treatment or other patient management decisions.  A negative result may occur with improper specimen collection / handling, submission of specimen other than nasopharyngeal swab, presence of viral mutation(s) within the areas targeted by this assay, and inadequate number of viral copies (<250 copies / mL). A negative result must be combined with clinical observations, patient history, and epidemiological information.  Fact Sheet for Patients:   https://www.patel.info/  Fact Sheet for Healthcare Providers: https://hall.com/  This test is not yet approved or  cleared by the [place] FDA and has been authorized for detection and/or diagnosis of SARS-CoV-2 by FDA under an Emergency Use Authorization (EUA).  This EUA will remain in effect (meaning this test can be used) for the duration of the COVID-19 declaration under Section 564(b)(1) of the Act, 21 U.S.C. section 360bbb-3(b)(1), unless the authorization is terminated or revoked sooner.  Performed at [facility], [place] [address] [place], [place] [address]   MRSA Next Gen by PCR, Nasal     Status: None   Collection Time: 10/22/2021  2:43 PM   Specimen: Nasal Mucosa; Nasal Swab  Result Value Ref Range Status   MRSA by PCR Next Gen NOT DETECTED NOT DETECTED Final    Comment: (NOTE) The GeneXpert MRSA Assay (FDA approved for NASAL specimens only), is one component of a comprehensive MRSA colonization surveillance program. It is not intended to diagnose MRSA infection nor to guide or monitor treatment for MRSA infections. Test performance is not FDA  approved in patients less than 19 years old. Performed at [facility], [place] [address], [place], [place] [address]     Time spent: 10  minutes  Signed: Darliss Cheney, MD 11/25/2021

## 2021-11-03 DEATH — deceased

## 2022-01-22 IMAGING — CT CT HEAD W/O CM
3 series · 15 of 47 positions shown, 18 images · non-contrast
Comparison: Prior head CT 03/09/2019, brain MRI 03/08/2019

CLINICAL DATA: Mental status change, persistent or worsening.
Additional history provided: Patient disoriented, unsure of ear and
president,

EXAM:
CT HEAD WITHOUT CONTRAST
TECHNIQUE: Contiguous axial images were obtained from the base of the skull
through the vertex without intravenous contrast.

[Series 2: head wo · axial · 0.38mm/px · z∈[+1420,+1545]mm · 9 of 31 slices shown, 12 images]
[im 3/31  brain]
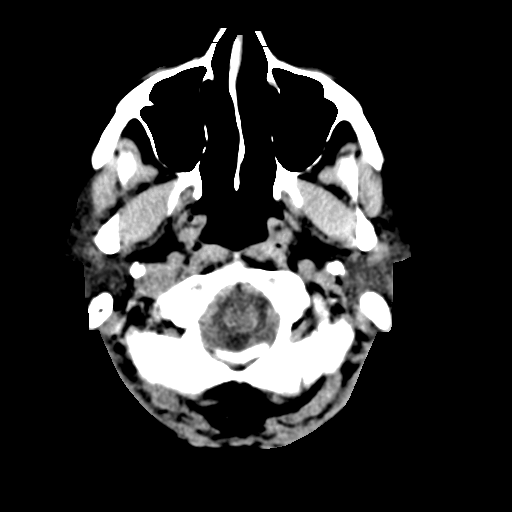
[im 3/31  bone]
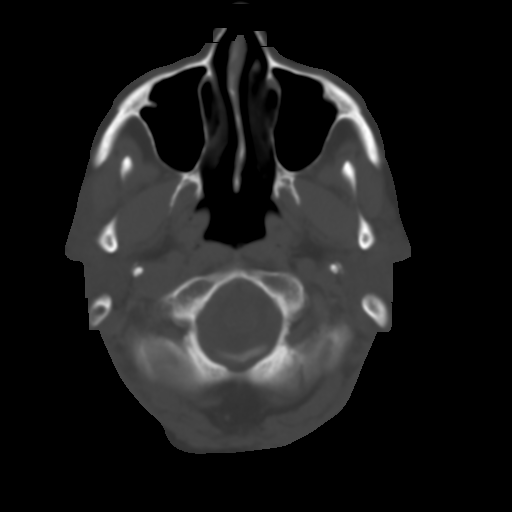
[im 6/31  brain]
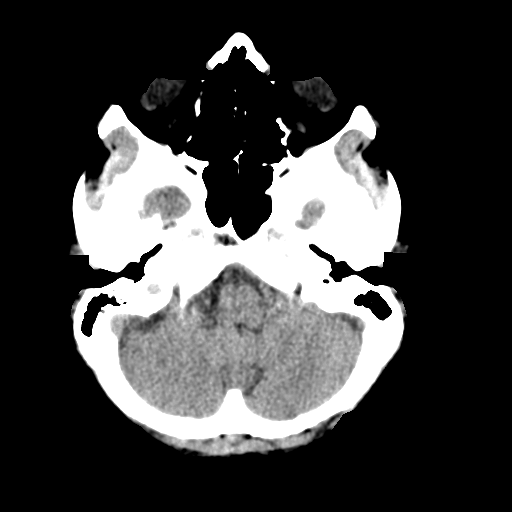
[im 9/31  brain]
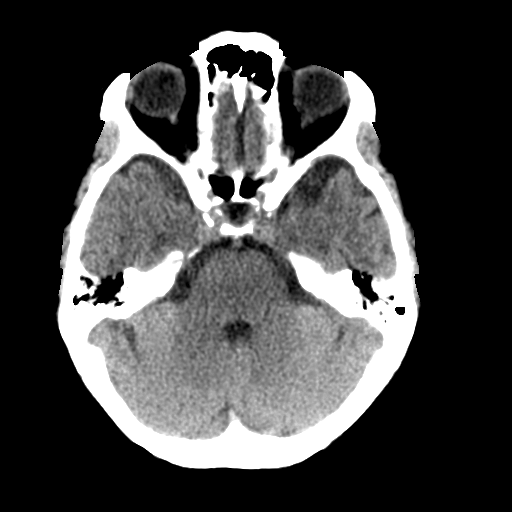
[im 12/31  brain]
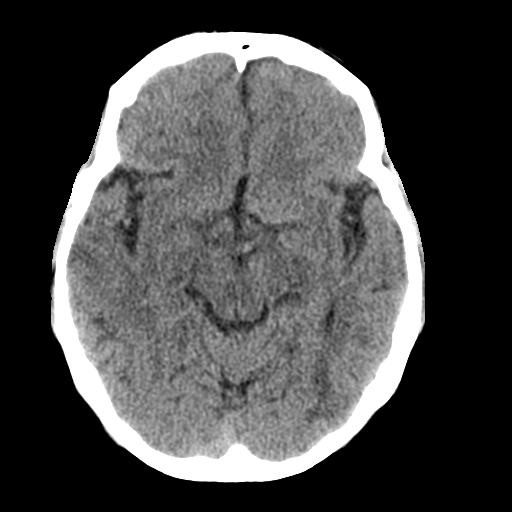
[im 16/31  brain]
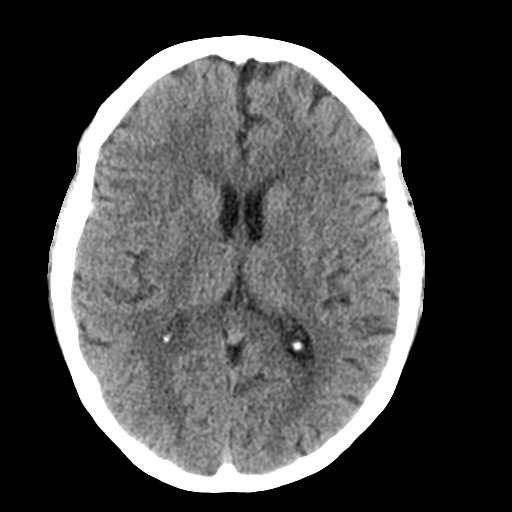
[im 16/31  bone]
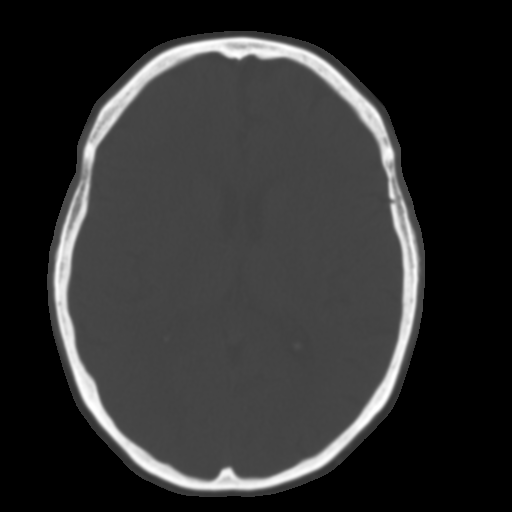
[im 19/31  brain]
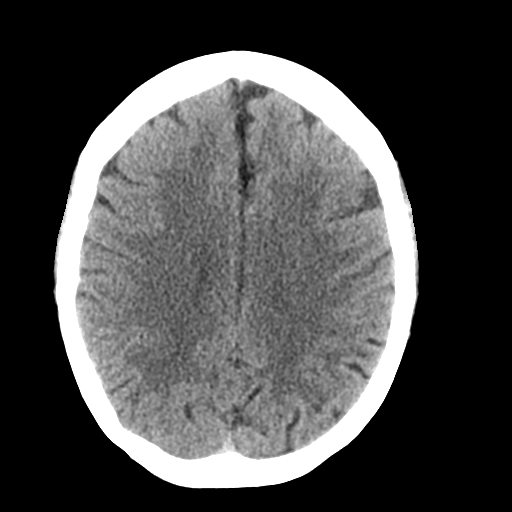
[im 22/31  brain]
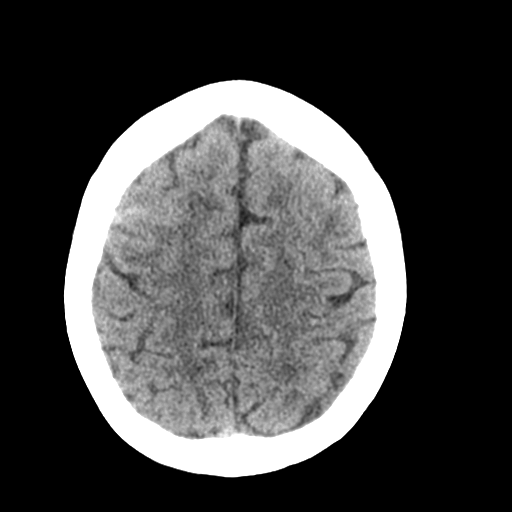
[im 25/31  brain]
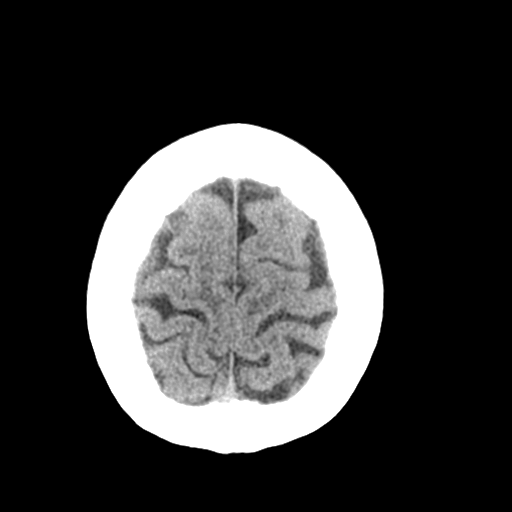
[im 28/31  brain]
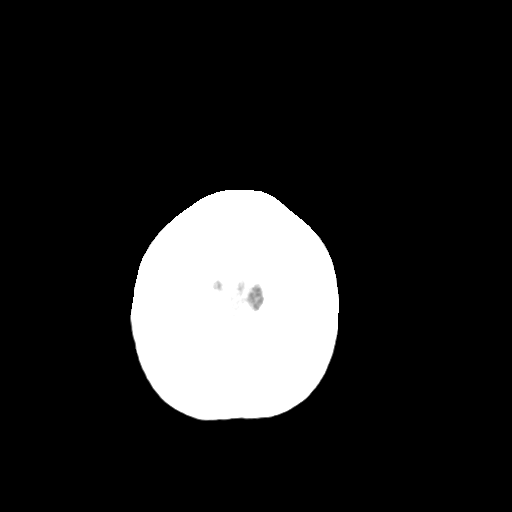
[im 28/31  bone]
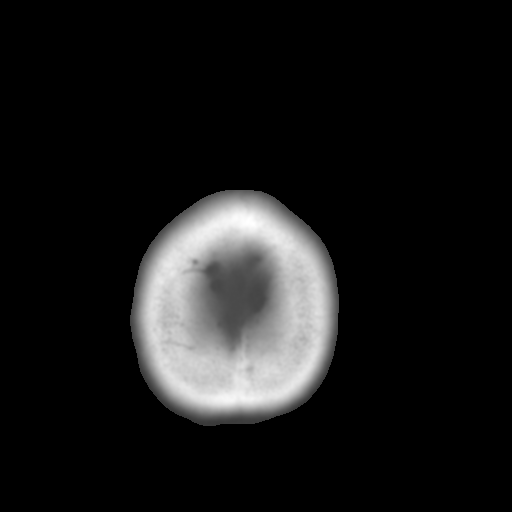

[Series 4: coronal soft tissue · coronal · 0.31mm/px · 3 of 68 slices shown]
[im 23/68  brain]
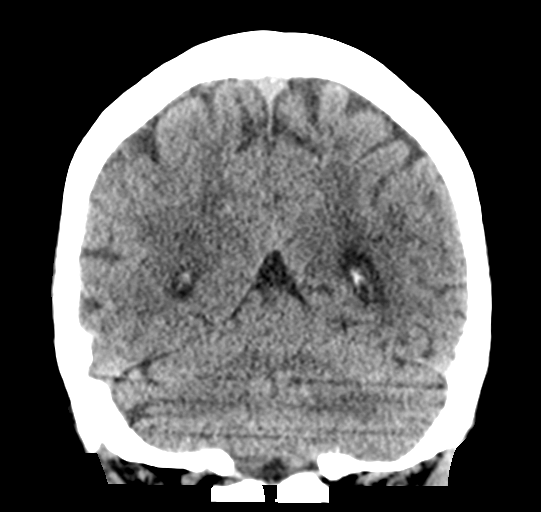
[im 30/68  brain]
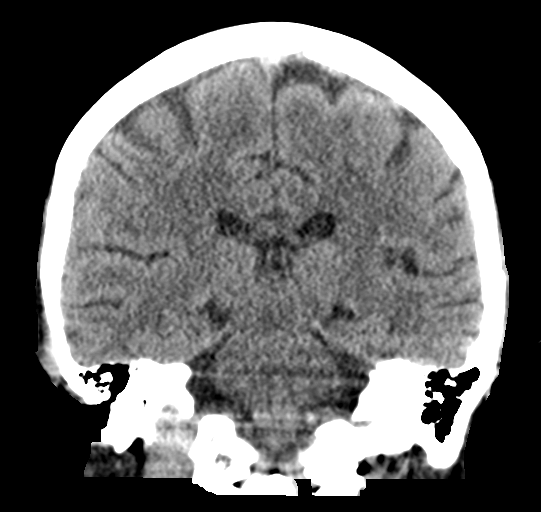
[im 38/68  brain]
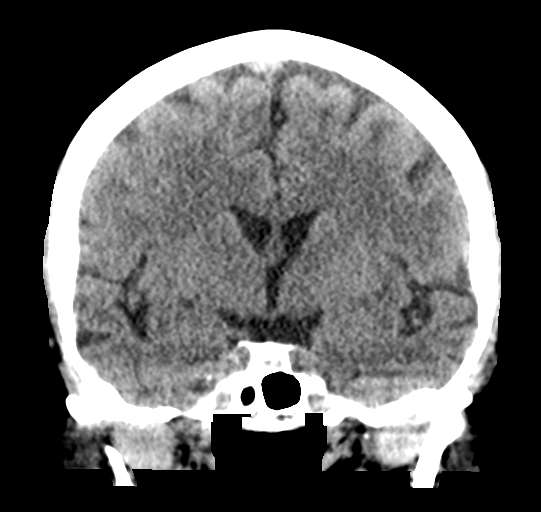

[Series 5: sagittal soft tissue · sagittal · 0.31mm/px · 3 of 56 slices shown]
[im 19/56  brain]
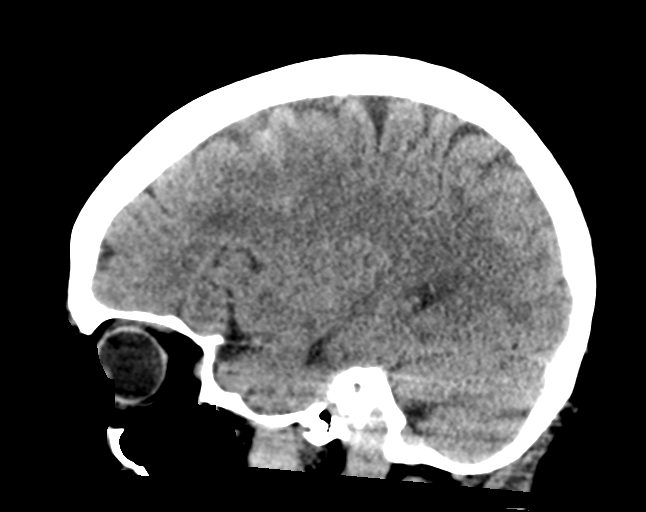
[im 28/56  brain]
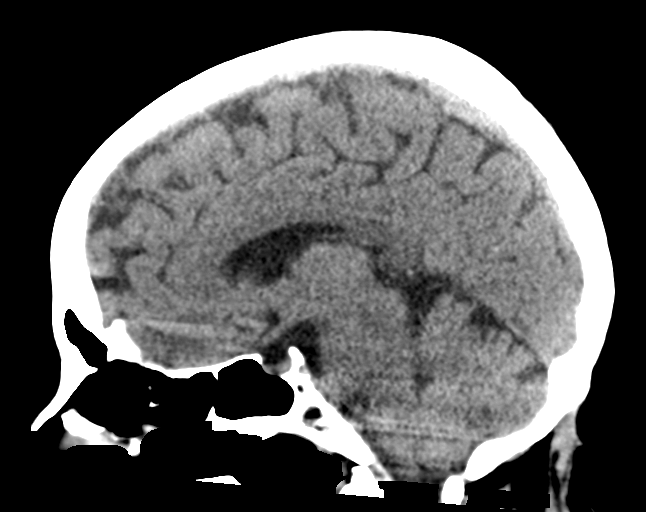
[im 37/56  brain]
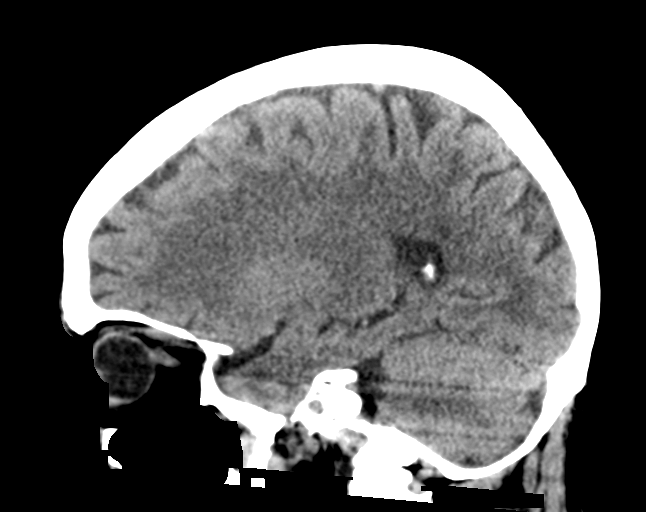

[15 of 47 positions shown; findings below may reference images not displayed]

FINDINGS: Brain:

Stable, mild generalized parenchymal atrophy.

There is small volume acute subarachnoid hemorrhage overlying the
mid to posterior right frontal lobe (for instance as seen on series
2, image 23) (series 5, image 16).

Stable moderate patchy hypodensity within the cerebral white matter
which is nonspecific, but consistent with chronic small vessel
ischemic disease.

No demarcated cortical infarct is identified.

No evidence of intracranial mass.

No midline shift.

Vascular: No hyperdense vessel.  Atherosclerotic calcifications

Skull: Chronic left occipital calvarial fracture. No acute calvarial
fracture is identified.

Sinuses/Orbits: Visualized orbits show no acute finding. Trace right
sphenoid sinus mucosal thickening. No significant mastoid effusion.

These results were called by telephone at the time of interpretation
on 07/03/2019 at [DATE] to provider Dr. Nissan, who verbally
acknowledged these results.
IMPRESSION: Small volume acute subarachnoid hemorrhage overlying the mid to
posterior right frontal lobe.

Stable mild generalized parenchymal atrophy and moderate chronic
small vessel ischemic disease.

Redemonstrated chronic left occipital calvarial fracture.

Minimal paranasal sinus mucosal thickening.

## 2022-01-23 IMAGING — CT CT ABDOMEN W/O CM
2 of 10 series · 13 of 46 positions shown, 18 images · non-contrast
Comparison: 02/13/2013 from [HOSPITAL]

CLINICAL DATA: Multiple episodes of flushing, diaphoresis,
hypertension and altered level of consciousness. Evaluate for
adrenal neoplasm.

EXAM:
CT ABDOMEN WITHOUT CONTRAST
TECHNIQUE: Multidetector CT imaging of the abdomen was performed following the
standard protocol without IV contrast.

[Series 3: axial st · axial · 0.72mm/px · z∈[-167,-23]mm · 11 of 58 slices shown, 16 images]
[im 5/58  soft-tissue]
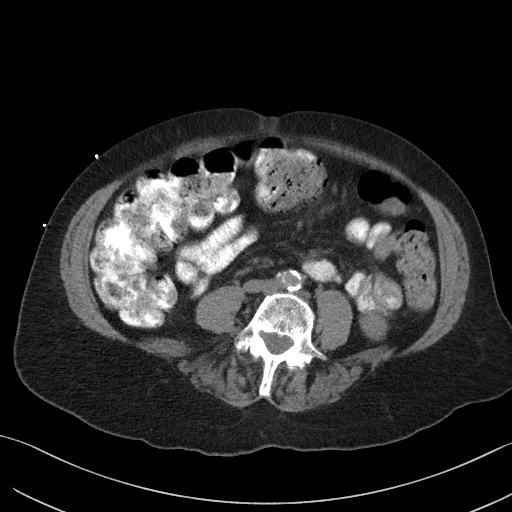
[im 5/58  bone]
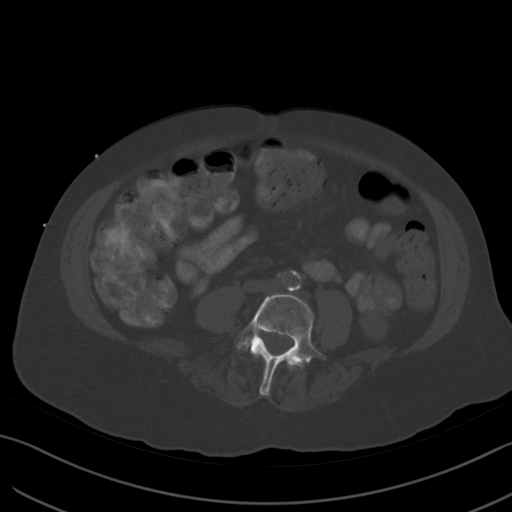
[im 9/58  soft-tissue]
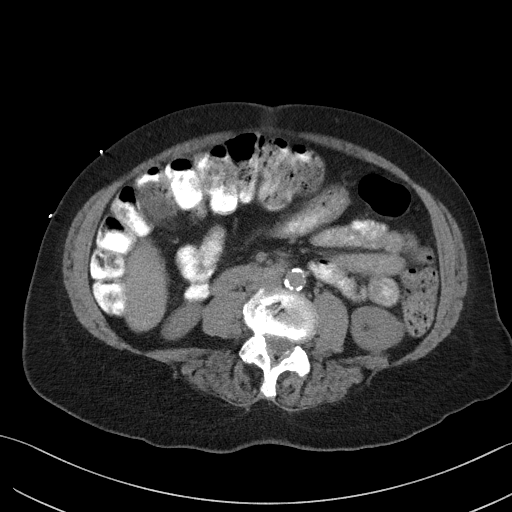
[im 18/58  soft-tissue]
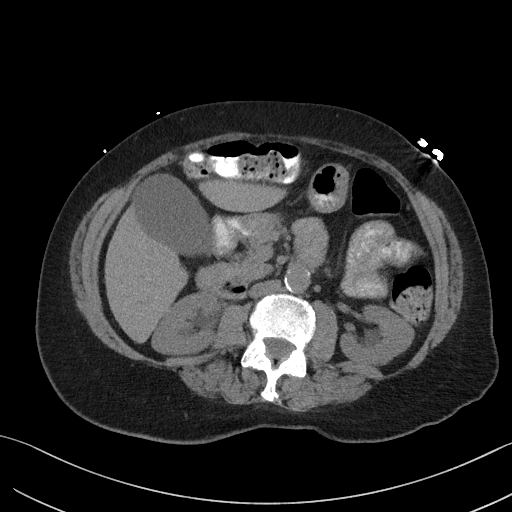
[im 22/58  soft-tissue]
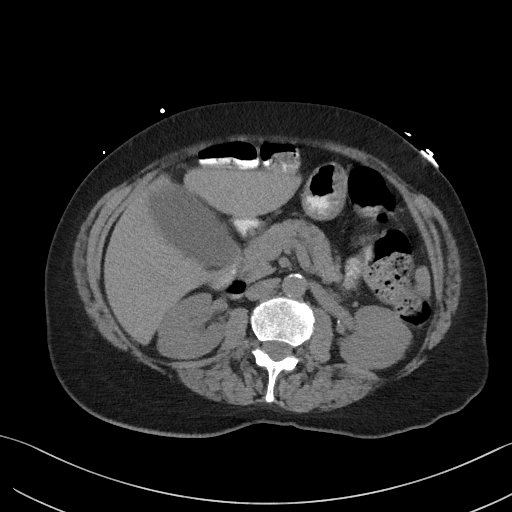
[im 27/58  soft-tissue]
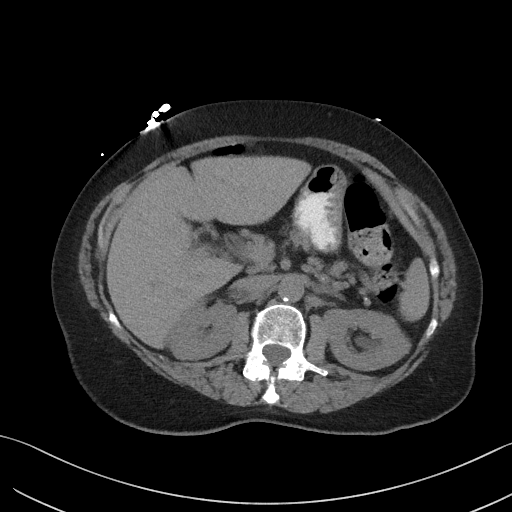
[im 31/58  soft-tissue]
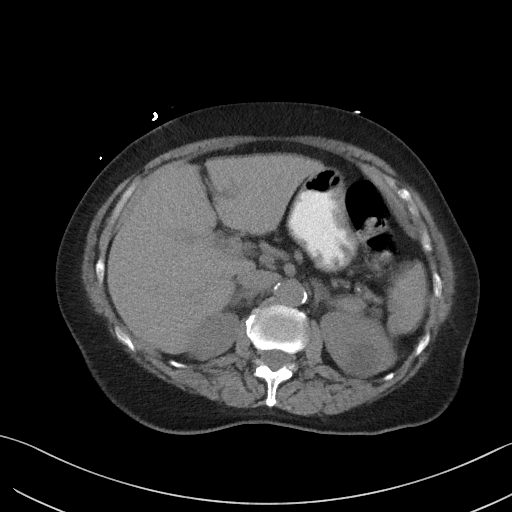
[im 36/58  soft-tissue]
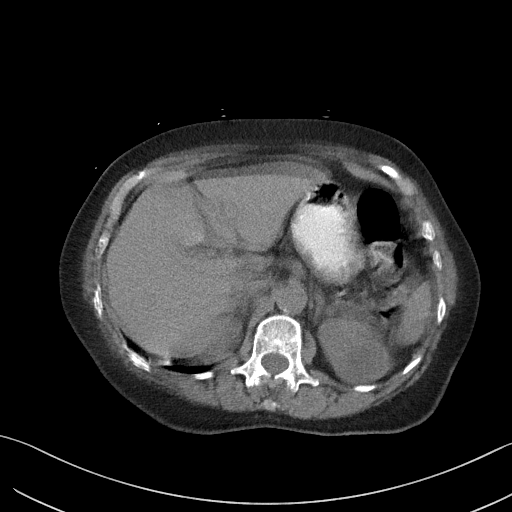
[im 40/58  lung]
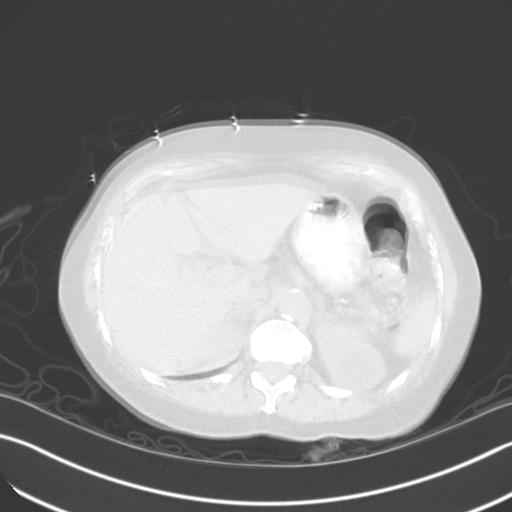
[im 44/58  soft-tissue]
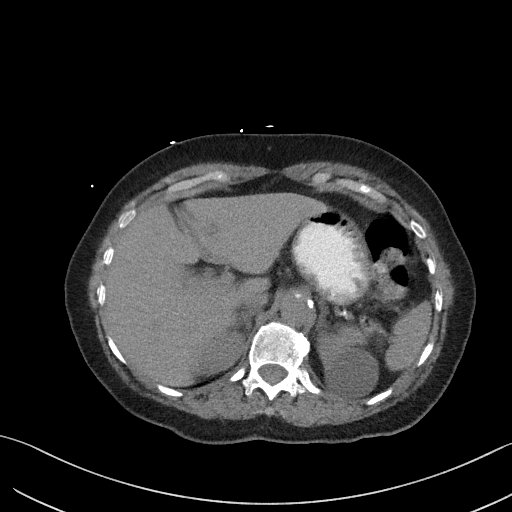
[im 44/58  lung]
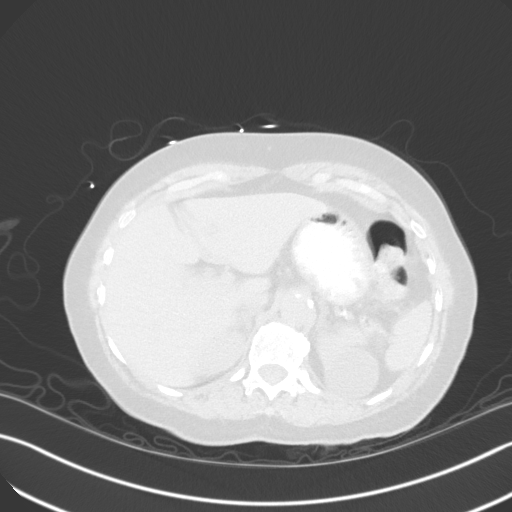
[im 49/58  soft-tissue]
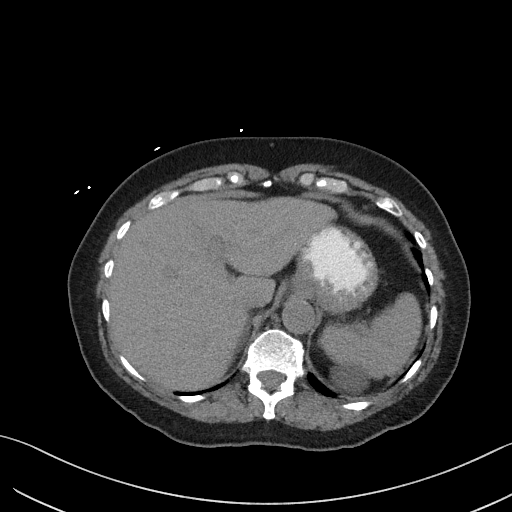
[im 49/58  lung]
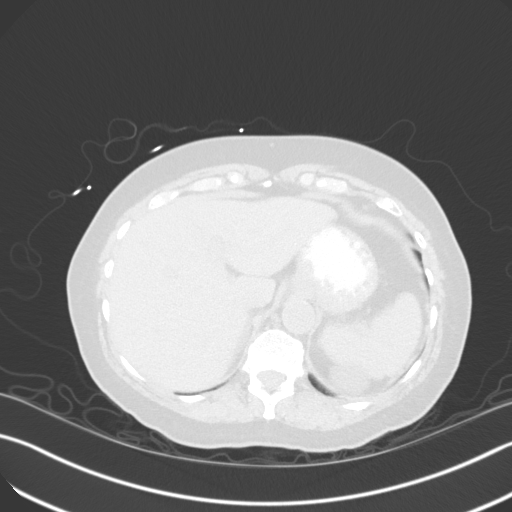
[im 49/58  bone]
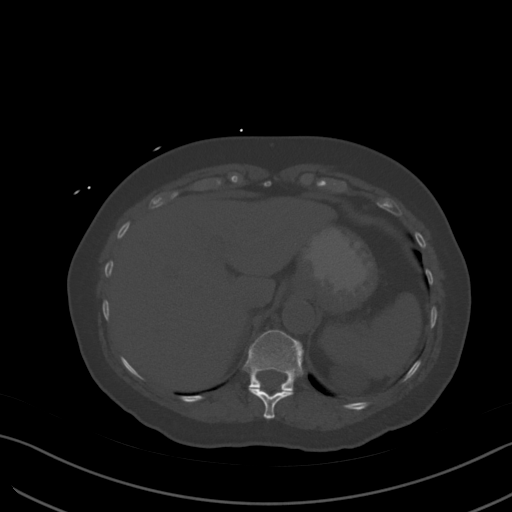
[im 53/58  soft-tissue]
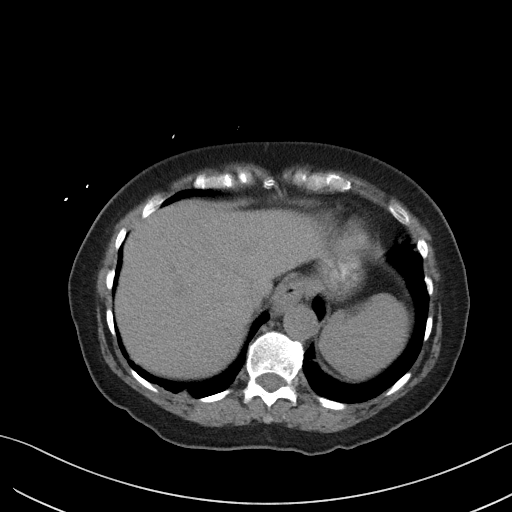
[im 53/58  lung]
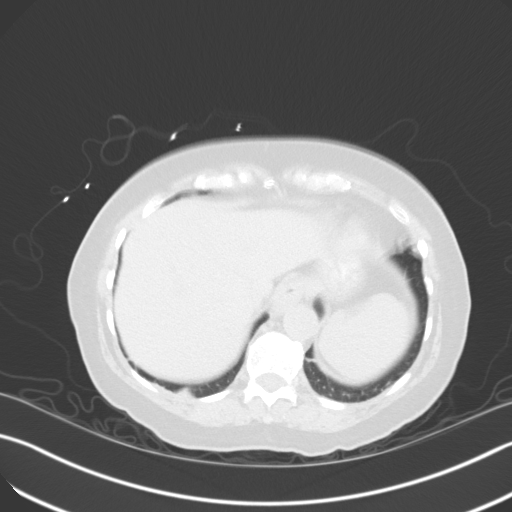

[Series 8: coronal wo · coronal · 0.36mm/px · 2 of 122 slices shown]
[im 41/122  soft-tissue]
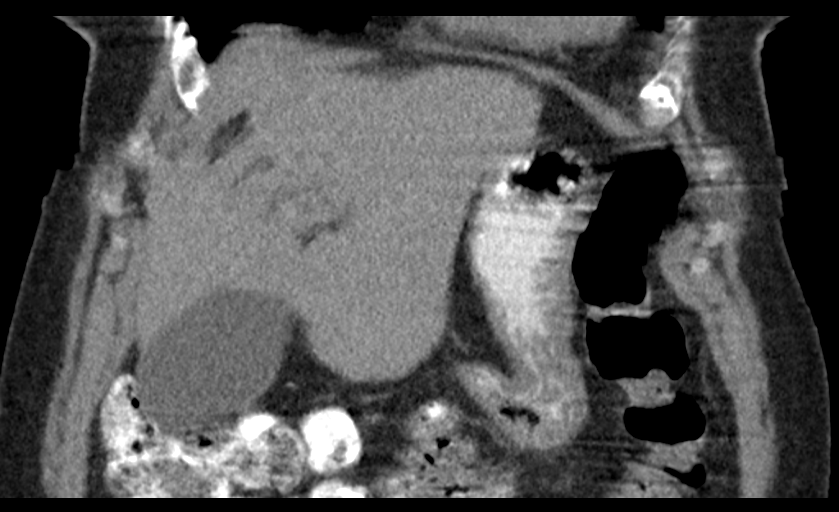
[im 81/122  soft-tissue]
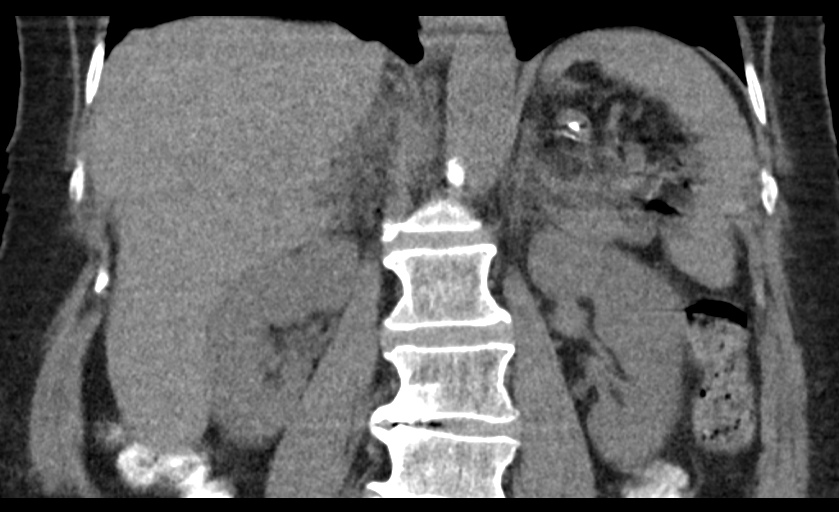

[13 of 46 positions shown; findings below may reference images not displayed]

FINDINGS: Lower chest: No acute findings.

Hepatobiliary: No masses visualized on this unenhanced exam. Tiny
calcified gallstone is noted, however there is no evidence of
cholecystitis or biliary ductal dilatation.

Pancreas: No mass or inflammatory process visualized on this
unenhanced exam.

Spleen:  Within normal limits in size.

Adrenals/Urinary tract: Normal appearance of both adrenal glands. No
evidence of adrenal or other retroperitoneal mass. 4 cm fluid
attenuation cyst noted in upper pole of left kidney. No evidence of
nephrolithiasis or hydronephrosis.

Stomach/Bowel: Visualized portion unremarkable.

Vascular/Lymphatic: No pathologically enlarged lymph nodes
identified. No evidence of abdominal aortic aneurysm. Aortic
atherosclerosis noted.

Other:  None.

Musculoskeletal:  No suspicious bone lesions identified.
IMPRESSION: 1. No evidence of adrenal mass or other abdominal neoplasm.1
2. Cholelithiasis. No radiographic evidence of cholecystitis.

Aortic Atherosclerosis ([id number]).
